# Patient Record
Sex: Male | Born: 1945
Health system: Southern US, Community
[De-identification: ages and names within clinical notes are randomized; demographics above are authoritative.]

## PROBLEM LIST (undated history)

## (undated) DIAGNOSIS — I2510 Atherosclerotic heart disease of native coronary artery without angina pectoris: Secondary | ICD-10-CM

## (undated) DIAGNOSIS — Z87442 Personal history of urinary calculi: Secondary | ICD-10-CM

## (undated) DIAGNOSIS — K4020 Bilateral inguinal hernia, without obstruction or gangrene, not specified as recurrent: Secondary | ICD-10-CM

## (undated) DIAGNOSIS — E119 Type 2 diabetes mellitus without complications: Secondary | ICD-10-CM

## (undated) DIAGNOSIS — I10 Essential (primary) hypertension: Secondary | ICD-10-CM

## (undated) DIAGNOSIS — G4733 Obstructive sleep apnea (adult) (pediatric): Secondary | ICD-10-CM

## (undated) DIAGNOSIS — M47812 Spondylosis without myelopathy or radiculopathy, cervical region: Principal | ICD-10-CM

## (undated) DIAGNOSIS — R7302 Impaired glucose tolerance (oral): Secondary | ICD-10-CM

## (undated) DIAGNOSIS — R42 Dizziness and giddiness: Secondary | ICD-10-CM

## (undated) DIAGNOSIS — Z9989 Dependence on other enabling machines and devices: Secondary | ICD-10-CM

## (undated) DIAGNOSIS — N400 Enlarged prostate without lower urinary tract symptoms: Secondary | ICD-10-CM

## (undated) DIAGNOSIS — E291 Testicular hypofunction: Secondary | ICD-10-CM

## (undated) DIAGNOSIS — I4891 Unspecified atrial fibrillation: Secondary | ICD-10-CM

## (undated) HISTORY — DX: Unspecified atrial fibrillation: I48.91

## (undated) HISTORY — DX: Essential (primary) hypertension: I10

## (undated) HISTORY — DX: Obstructive sleep apnea (adult) (pediatric): G47.33

## (undated) HISTORY — DX: Benign prostatic hyperplasia without lower urinary tract symptoms: N40.0

## (undated) HISTORY — DX: Impaired glucose tolerance (oral): R73.02

## (undated) HISTORY — DX: Spondylosis without myelopathy or radiculopathy, cervical region: M47.812

## (undated) HISTORY — DX: Dependence on other enabling machines and devices: Z99.89

## (undated) HISTORY — DX: Testicular hypofunction: E29.1

## (undated) HISTORY — DX: Dizziness and giddiness: R42

---

## 1990-03-27 HISTORY — PX: KNEE SURGERY: SHX244

## 2002-12-12 ENCOUNTER — Ambulatory Visit (HOSPITAL_BASED_OUTPATIENT_CLINIC_OR_DEPARTMENT_OTHER): Admission: RE | Admit: 2002-12-12 | Discharge: 2002-12-12 | Payer: Self-pay | Admitting: Surgery

## 2002-12-12 ENCOUNTER — Encounter (INDEPENDENT_AMBULATORY_CARE_PROVIDER_SITE_OTHER): Payer: Self-pay | Admitting: Specialist

## 2006-01-15 ENCOUNTER — Ambulatory Visit (HOSPITAL_BASED_OUTPATIENT_CLINIC_OR_DEPARTMENT_OTHER): Admission: RE | Admit: 2006-01-15 | Discharge: 2006-01-15 | Payer: Self-pay | Admitting: Orthopedic Surgery

## 2006-01-23 ENCOUNTER — Encounter: Admission: RE | Admit: 2006-01-23 | Discharge: 2006-01-23 | Payer: Self-pay | Admitting: Neurological Surgery

## 2006-09-12 ENCOUNTER — Encounter: Admission: RE | Admit: 2006-09-12 | Discharge: 2006-09-12 | Payer: Self-pay | Admitting: Neurological Surgery

## 2006-11-28 ENCOUNTER — Ambulatory Visit: Payer: Self-pay | Admitting: Psychology

## 2008-04-24 ENCOUNTER — Encounter: Admission: RE | Admit: 2008-04-24 | Discharge: 2008-04-24 | Payer: Self-pay | Admitting: Internal Medicine

## 2008-05-15 ENCOUNTER — Encounter: Admission: RE | Admit: 2008-05-15 | Discharge: 2008-05-15 | Payer: Self-pay | Admitting: Internal Medicine

## 2010-08-12 NOTE — Op Note (Signed)
NAME:  Arthur Wilson, Arthur Wilson               ACCOUNT NO.:  [id number]   MEDICAL RECORD NO.:  [id number]          PATIENT TYPE:  AMB   LOCATION:  DSC                          FACILITY:  [facility]   PHYSICIAN:  Robert A. Thurston Hole, M.D. DATE OF BIRTH:  08/25/1945   DATE OF PROCEDURE:  01/15/2006  DATE OF DISCHARGE:                                 OPERATIVE REPORT   PREOPERATIVE DIAGNOSES:  1. Left knee medial and lateral meniscal tears with chondromalacia and      synovitis.  2. Left knee anterior cruciate ligament deficiency.   POSTOPERATIVE DIAGNOSES:  1. Left knee medial and lateral meniscal tears with chondromalacia and      synovitis.  2. Left knee anterior cruciate ligament deficiency.   PROCEDURE:  1. Left knee exam under anesthesia followed by arthroscopic partial medial      and lateral meniscectomies.  2. Left knee chondroplasty with partial synovectomy.   SURGEON:  Elana Alm. Thurston Hole, M.D.   ASSISTANT:  Julien Girt, P.A.   ANESTHESIA:  Local and MAC.   OPERATIVE TIME:  Of 30 minutes.   COMPLICATIONS:  None.   INDICATIONS FOR PROCEDURE:  Arthur Wilson is a 65 year old gentleman who has had  a year of increasing left knee pain with exam and MRI documenting partial  meniscal tearing with chondromalacia and synovitis.  He has failed  conservative care and is now to undergo arthroscopy.   DESCRIPTION:  Arthur Wilson was brought to operating room on January 15, 2006  after a knee block was placed in holding room by anesthesia.  He was placed  on the operative table in supine position.  His left knee was examined.  He  had full range of motion.  He had 1 to 2+ Lachman.  He was stable to varus,  valgus and posterior stress with normal patellar tracking.  Left leg was  prepped using sterile DuraPrep and draped using sterile technique.  Originally through an anterior and lateral portal, the arthroscope with a  pump attached was placed into an anteromedial portal, and arthroscopic probe  was placed.  On initial inspection of the medial compartment, he was found  to 25 to 30% grade 3 chondromalacia which was debrided.  Medial meniscus  showed a small tear posterior medial corner of which 20% was resected back  to stable rim.  Intercondylar notch was inspected.  The anterior cruciate  ligament appeared to be chronically torn proximally and scored to the PCL  with approximately 5 mm of anterior laxity.  Posterior cruciate was intact  and stable.  Lateral compartment inspected, 25 to 30% grade 3 chondromalacia  which was debrided.  Lateral meniscus had had a previous partial  meniscectomy and about bed 80% remained and another 25 to 30% was torn, and  this was resected back to a stable rim.  Patellofemoral joint showed a 50 to  70% grade 3 chondromalacia on the patellar and femoral groove, and this was  debrided.  The patella tracked normally.  Significant synovitis in the  medial and lateral gutters were debrided.  Otherwise, they are free of  pathology.  After this was done, it felt that all of the pathology had been  satisfactorily addressed.  The instruments were removed.  Portals were  closed with 3-0 nylon suture and injected with 0.25% Marcaine with  epinephrine and 4 mg of morphine.  Sterile dressings were applied, and the  patient awakened and taken to recovery room in stable condition.   FOLLOW-UP CARE:  Arthur Wilson will be followed as an outpatient on Vicodin and  Naprosyn.  See him back in the office in a week for sutures out and follow-  up.      Robert A. Thurston Hole, M.D.  Electronically Signed     RAW/MEDQ  D:  01/15/2006  T:  01/16/2006  Job:  [id number]

## 2011-06-19 DIAGNOSIS — R972 Elevated prostate specific antigen [PSA]: Secondary | ICD-10-CM | POA: Insufficient documentation

## 2011-06-19 DIAGNOSIS — E291 Testicular hypofunction: Secondary | ICD-10-CM | POA: Insufficient documentation

## 2011-12-25 DIAGNOSIS — Z8042 Family history of malignant neoplasm of prostate: Secondary | ICD-10-CM | POA: Insufficient documentation

## 2012-11-21 ENCOUNTER — Encounter: Payer: Self-pay | Admitting: Neurology

## 2012-11-21 ENCOUNTER — Encounter (INDEPENDENT_AMBULATORY_CARE_PROVIDER_SITE_OTHER): Payer: BC Managed Care – PPO | Admitting: Radiology

## 2012-11-21 ENCOUNTER — Telehealth: Payer: Self-pay | Admitting: Neurology

## 2012-11-21 ENCOUNTER — Ambulatory Visit (INDEPENDENT_AMBULATORY_CARE_PROVIDER_SITE_OTHER): Payer: BC Managed Care – PPO | Admitting: Neurology

## 2012-11-21 VITALS — BP 126/73 | HR 48 | Ht 71.0 in | Wt 236.0 lb

## 2012-11-21 DIAGNOSIS — R42 Dizziness and giddiness: Secondary | ICD-10-CM

## 2012-11-21 DIAGNOSIS — M47812 Spondylosis without myelopathy or radiculopathy, cervical region: Secondary | ICD-10-CM

## 2012-11-21 DIAGNOSIS — Z0289 Encounter for other administrative examinations: Secondary | ICD-10-CM

## 2012-11-21 HISTORY — DX: Spondylosis without myelopathy or radiculopathy, cervical region: M47.812

## 2012-11-21 NOTE — Telephone Encounter (Signed)
Called patient. The nerve conduction studies did not show evidence of a peripheral neuropathy, but the H reflex latencies and the F wave latencies were prolonged bilaterally. The patient could potentially have spinal stenosis of the low back causing issues. Depending upon the results of the blood work and MRI of the cervical spine, we may consider looking at the lumbosacral spine in the future.

## 2012-11-21 NOTE — Progress Notes (Signed)
Reason for visit: Neck discomfort  Arthur Wilson is a 67 y.o. male  History of present illness:  Arthur Wilson is a 67 year old right-handed white male with a history of cervical spine discomfort for 12 years or more. The patient has received epidural steroid injections, and he receives chiropractic manipulations on a regular basis that helps him for 3 or 4 weeks. The patient indicates that 3 months ago, he began having floaty sensations when he is up on his feet. The patient has a spongy sensation in his feet when he walks. The patient denies actual numbness in the feet. The patient has a sensation as if he is rocking back-and-forth. The patient correlates the onset of his symptoms when he began taking Flomax. The patient felt poorly on Flomax, and within the last 5 days, he has stopped the medication, and he is starting to feel better. The patient denies any stinging sensations in his feet. The patient does have some low back pain. The patient denies pain radiating from the neck down the arms or pain radiating from the low back down the legs. The patient denies any weakness of the extremities, or problems controlling the bowels or the bladder. The patient is sent to this office for further evaluation.  Past Medical History  Diagnosis Date  . Hypertension   . Cervical spondylosis without myelopathy 11/21/2012  . Dizziness and giddiness 11/21/2012  . BPH (benign prostatic hyperplasia)     Past Surgical History  Procedure Laterality Date  . Knee surgery  1992    removal of ACL from ski fall    Family History  Problem Relation Age of Onset  . Hypertension Father   . Prostate cancer Paternal Uncle   . Hypertension Paternal Grandfather     Social history:  reports that he has never smoked. He has never used smokeless tobacco. He reports that he does not drink alcohol or use illicit drugs.  Medications:  No current outpatient prescriptions on file prior to visit.   No current  facility-administered medications on file prior to visit.     No Known Allergies  ROS:  Out of a complete 14 system review of symptoms, the patient complains only of the following symptoms, and all other reviewed systems are negative.  Urination problems Dizziness  Blood pressure 126/73, pulse 48, height 5\' 11"  (1.803 m), weight 236 lb (107.049 kg).  Physical Exam  General: The patient is alert and cooperative at the time of the examination.  Head: Pupils are equal, round, and reactive to light. Discs are flat bilaterally.  Neck: The neck is supple, no carotid bruits are noted.  Respiratory: The respiratory examination is clear.  Cardiovascular: The cardiovascular examination reveals a regular rate and rhythm, no obvious murmurs or rubs are noted.  Skin: Extremities are with 1-2+ edema at the ankles bilaterally.  Neurologic Exam  Mental status:  Cranial nerves: Facial symmetry is present. The patient has bilateral ptosis, more prominent on the right than the left. There is good sensation of the face to pinprick and soft touch bilaterally. The strength of the facial muscles and the muscles to head turning and shoulder shrug are normal bilaterally. Speech is well enunciated, no aphasia or dysarthria is noted. Extraocular movements are full. Visual fields are full.  Motor: The motor testing reveals 5 over 5 strength of all 4 extremities. Good symmetric motor tone is noted throughout.  Sensory: Sensory testing is intact to pinprick, soft touch, vibration sensation, and position sense on all  4 extremities, with the exception that there is a stocking pattern pinprick sensory deficit across the ankles bilaterally, and position sense is decreased in the right foot. No evidence of extinction is noted.  Coordination: Cerebellar testing reveals good finger-nose-finger and heel-to-shin bilaterally.  Gait and station: Gait is normal. Tandem gait is slightly unsteady. Romberg is negative.  No drift is seen.  Reflexes: Deep tendon reflexes are symmetric and normal bilaterally, with the exception that the ankle jerk reflexes are depressed bilaterally. Toes are downgoing bilaterally.   Assessment/Plan:  1. Chronic cervical spondylosis  2. Lightheaded sensations  The etiology of the lightheaded and floaty sensations, rocking sensations are not clear. The patient reports a spongy sensations in the feet, and he may have an early peripheral neuropathy. The patient will be sent for blood work today, and he will have nerve conduction studies of both legs. The patient will be set up for MRI evaluation of the cervical spine to exclude low-grade spinal cord compression, or spinal stenosis. The patient will followup in about 4 months. If a peripheral neuropathy is present, further evaluation will be undertaken. The patient apparently began having his current symptoms when he began Flomax, and he recently stopped his medication.  Marlan Palau MD 11/21/2012 9:02 PM  [facility] [address] [place], [place] [address]  Phone [phone] Fax [phone]

## 2012-11-21 NOTE — Procedures (Signed)
  HISTORY:  Aulden Calise is a 67 year old gentleman with a three-month history of a sensation of "spongy feet", and a rocking sensation when he is up on his feet. The patient is being evaluated for a possible peripheral neuropathy.  NERVE CONDUCTION STUDIES:  Nerve conduction studies were performed on both lower extremities. The distal motor latencies and motor amplitudes for the peroneal and posterior tibial nerves were within normal limits. The nerve conduction velocities for these nerves were also normal. The H reflex latencies were prolonged. The F wave latencies for the peroneal and posterior tibial nerves were prolonged bilaterally, with the exception that the right posterior tibial F-wave latency was normal. The sensory latencies for the peroneal nerves were within normal limits.   EMG STUDIES:  EMG evaluation was not performed.  IMPRESSION:  Nerve conduction studies done on both lower extremities were relatively unremarkable, without clear evidence of a peripheral neuropathy. Prolongation of the F wave latencies and H reflex latencies were seen, suggesting possible proximal dysfunction of the nerves or nerve roots. Clinical correlation is required.  Marlan Palau MD 11/21/2012 12:56 PM  [facility] [address] [place], [place] [address]  Phone [phone] Fax [phone]

## 2012-11-22 LAB — TSH: TSH: 1.29 u[IU]/mL (ref 0.450–4.500)

## 2012-11-22 LAB — VITAMIN B12: Vitamin B-12: 1815 pg/mL — ABNORMAL HIGH (ref 211–946)

## 2012-11-22 NOTE — Progress Notes (Signed)
Quick Note:  Left a message on the patient's vm relaying the results of their recent labs. Contact information was also given so that the patient may call back if they have any questions. ______ 

## 2012-11-22 NOTE — Telephone Encounter (Signed)
I called the patient. I went over the nerve conduction study results with him again. No evidence of a peripheral neuropathy, but all of the H. reflex latencies and F wave latencies are prolonged. If the MRI of the cervical spine does not show a reason for his gait issues, we will check MRI evaluation of the low back to exclude the possibility of lumbosacral spinal stenosis. The blood work was unremarkable.

## 2012-11-27 ENCOUNTER — Ambulatory Visit (INDEPENDENT_AMBULATORY_CARE_PROVIDER_SITE_OTHER): Payer: BC Managed Care – PPO

## 2012-11-27 DIAGNOSIS — R42 Dizziness and giddiness: Secondary | ICD-10-CM

## 2012-11-27 DIAGNOSIS — M47812 Spondylosis without myelopathy or radiculopathy, cervical region: Secondary | ICD-10-CM

## 2012-11-29 ENCOUNTER — Telehealth: Payer: Self-pay | Admitting: Neurology

## 2012-11-29 DIAGNOSIS — M545 Low back pain, unspecified: Secondary | ICD-10-CM

## 2012-11-29 NOTE — Telephone Encounter (Signed)
I called the patient. The MRI of the cervical spine show multilevel spondylosis, and NF stenosis. There is no spinal stenosis or cord issues. This does not explain the unusual sensations in the feet. I will get a L/S MRI.

## 2012-12-18 ENCOUNTER — Ambulatory Visit (INDEPENDENT_AMBULATORY_CARE_PROVIDER_SITE_OTHER): Payer: BC Managed Care – PPO

## 2012-12-18 DIAGNOSIS — M545 Low back pain, unspecified: Secondary | ICD-10-CM

## 2012-12-19 ENCOUNTER — Telehealth: Payer: Self-pay | Admitting: Neurology

## 2012-12-19 NOTE — Telephone Encounter (Signed)
I called patient. The MRI study of the lumbosacral spine does not show severe spinal stenosis. The patient may have a small fiber neuropathy or an early peripheral neuropathy. We will follow patient over time.

## 2013-01-30 ENCOUNTER — Other Ambulatory Visit (HOSPITAL_COMMUNITY): Payer: Self-pay | Admitting: Internal Medicine

## 2013-01-30 DIAGNOSIS — R2689 Other abnormalities of gait and mobility: Secondary | ICD-10-CM

## 2013-02-04 ENCOUNTER — Ambulatory Visit (HOSPITAL_COMMUNITY)
Admission: RE | Admit: 2013-02-04 | Discharge: 2013-02-04 | Disposition: A | Discharge status: Home / Self Care | Payer: BC Managed Care – PPO | Source: Ambulatory Visit | Attending: Internal Medicine | Admitting: Internal Medicine

## 2013-02-04 ENCOUNTER — Other Ambulatory Visit (HOSPITAL_COMMUNITY): Payer: Self-pay | Admitting: Internal Medicine

## 2013-02-04 DIAGNOSIS — R05 Cough: Secondary | ICD-10-CM | POA: Insufficient documentation

## 2013-02-04 DIAGNOSIS — R2689 Other abnormalities of gait and mobility: Secondary | ICD-10-CM

## 2013-02-04 DIAGNOSIS — R918 Other nonspecific abnormal finding of lung field: Secondary | ICD-10-CM | POA: Insufficient documentation

## 2013-02-04 DIAGNOSIS — I6789 Other cerebrovascular disease: Secondary | ICD-10-CM | POA: Insufficient documentation

## 2013-02-04 DIAGNOSIS — R55 Syncope and collapse: Secondary | ICD-10-CM | POA: Insufficient documentation

## 2013-02-04 DIAGNOSIS — R51 Headache: Secondary | ICD-10-CM | POA: Insufficient documentation

## 2013-02-04 DIAGNOSIS — R059 Cough, unspecified: Secondary | ICD-10-CM

## 2014-01-28 ENCOUNTER — Ambulatory Visit (HOSPITAL_COMMUNITY)
Admission: RE | Admit: 2014-01-28 | Discharge: 2014-01-28 | Disposition: A | Discharge status: Home / Self Care | Payer: BC Managed Care – PPO | Source: Ambulatory Visit | Attending: Internal Medicine | Admitting: Internal Medicine

## 2014-01-28 ENCOUNTER — Other Ambulatory Visit (HOSPITAL_COMMUNITY): Payer: Self-pay | Admitting: Internal Medicine

## 2014-01-28 DIAGNOSIS — M546 Pain in thoracic spine: Secondary | ICD-10-CM

## 2014-01-28 DIAGNOSIS — M47894 Other spondylosis, thoracic region: Secondary | ICD-10-CM | POA: Insufficient documentation

## 2014-02-04 ENCOUNTER — Encounter: Payer: Self-pay | Admitting: Neurology

## 2014-02-04 ENCOUNTER — Ambulatory Visit (INDEPENDENT_AMBULATORY_CARE_PROVIDER_SITE_OTHER): Payer: BC Managed Care – PPO | Admitting: Neurology

## 2014-02-04 VITALS — BP 153/74 | HR 48 | Ht 70.0 in | Wt 242.0 lb

## 2014-02-04 DIAGNOSIS — R5383 Other fatigue: Secondary | ICD-10-CM | POA: Insufficient documentation

## 2014-02-04 DIAGNOSIS — R42 Dizziness and giddiness: Secondary | ICD-10-CM

## 2014-02-04 DIAGNOSIS — M47812 Spondylosis without myelopathy or radiculopathy, cervical region: Secondary | ICD-10-CM

## 2014-02-04 DIAGNOSIS — R5382 Chronic fatigue, unspecified: Secondary | ICD-10-CM

## 2014-02-04 MED ORDER — PREGABALIN 50 MG PO CAPS: ORAL_CAPSULE | ORAL | Status: DC

## 2014-02-04 NOTE — Patient Instructions (Signed)

## 2014-02-04 NOTE — Progress Notes (Signed)
Reason for visit: fatigue, neck pain  Arthur Wilson is an 68 y.o. male  History of present illness:  Arthur Wilson is a 68 year old right-handed white male with a history of chronic fatigue issues. The patient indicates that this came on relatively suddenly. The patient was seen through this office in August 2014 indicating that his symptoms began about 3 months prior. The patient has had a long-standing history of cervical spine pain, and neck stiffness. The patient has seen a chiropractor for this. He has had MRI evaluations of the brain, cervical spine, and lumbosacral spine. The patient has been documented to have a slightly low testosterone level, but treatment offered minimal benefit. The patient indicates that he feels poorly in general. The patient in the past has had a CPAP machine, but he indicates that he never had a sleep study, and sleep apnea was never documented. He does snore at night at times, but he denies any significant issues with excessive daytime drowsiness. He returns to this office for further evaluation.  Past Medical History  Diagnosis Date  . Hypertension   . Cervical spondylosis without myelopathy 11/21/2012  . Dizziness and giddiness 11/21/2012  . BPH (benign prostatic hyperplasia)     Past Surgical History  Procedure Laterality Date  . Knee surgery  1992    removal of ACL from ski fall    Family History  Problem Relation Age of Onset  . Hypertension Father   . Prostate cancer Paternal Uncle   . Hypertension Paternal Grandfather     Social history:  reports that he has never smoked. He has never used smokeless tobacco. He reports that he does not drink alcohol or use illicit drugs.   No Known Allergies  Medications:  Current Outpatient Prescriptions on File Prior to Visit  Medication Sig Dispense Refill  . aspirin 81 MG tablet Take 81 mg by mouth daily.    . fexofenadine (ALLEGRA) 180 MG tablet Take 180 mg by mouth daily.     No current  facility-administered medications on file prior to visit.    ROS:  Out of a complete 14 system review of symptoms, the patient complains only of the following symptoms, and all other reviewed systems are negative.  Itching Joint pain, muscle cramps, aching muscles Weakness Decreased energy, disinterest in activities  Blood pressure 153/74, pulse 48, height 5\' 10"  (1.778 m), weight 242 lb (109.77 kg).  Physical Exam  General: The patient is alert and cooperative at the time of the examination. The patient is minimally obese.  Skin: No significant peripheral edema is noted.   Neurologic Exam  Mental status: The patient is oriented x 3.  Cranial nerves: Facial symmetry is present. Speech is normal, no aphasia or dysarthria is noted. Extraocular movements are full. Visual fields are full.  Motor: The patient has good strength in all 4 extremities.  Sensory examination: Soft touch sensation is symmetric on the face, arms, and legs.  Coordination: The patient has good finger-nose-finger and heel-to-shin bilaterally.  Gait and station: The patient has a normal gait. Tandem gait is normal. Romberg is negative. No drift is seen.  Reflexes: Deep tendon reflexes are symmetric.   MRI brain 02/04/13:  IMPRESSION: 1. No acute intracranial abnormality.  2. Mild for age nonspecific white matter signal changes, most commonly due to chronic small vessel disease.   MRI lumbar 12/18/12:  IMPRESSION:  Abnormal MRI lumbar spine (without) demonstrating: 1. At L2-3: disc bulging and facet hypertrophy with mild biforaminal foraminal  stenosis. 2. At L3-4, L4-5: disc bulging and facet hypertrophy with mild left foraminal stenosis. 3. Scoliosis convex to the left centered at L3-4. Multi-level degenerative changes.   MRI cervical 11/27/12:  IMPRESSION:  Abnormal MRI cervical spine (without) demonstrating mild spondylosis and disc bulging at C3-4 down to C6-7. This results in borderline  mild spinal stenosis at C3-4 to C5-6 and multi-level foraminal stenosis. No cord signal abnormalities.   Assessment/Plan:  1. Chronic fatigue  2. Cervical spondylosis, chronic neck pain  The patient will be given a trial on Lyrica to see if we can help the neuromuscular symptoms. The patient has a more generalized sensation of fatigue and malaise. Certainly, a low testosterone level may result in these symptoms, but fatigue is very nonspecific. The patient will be sent for further blood work. The possibility of a sleep disturbance or depression does need be considered. The patient will follow-up in about 4 or 5 months.  Jill Alexanders MD 02/04/2014 7:23 PM  [facility] [address] [place], [place] [address]  Phone [phone] Fax [phone]

## 2014-02-11 ENCOUNTER — Encounter: Payer: Self-pay | Admitting: Neurology

## 2014-02-17 ENCOUNTER — Encounter: Payer: Self-pay | Admitting: Neurology

## 2014-05-19 ENCOUNTER — Other Ambulatory Visit: Payer: Self-pay

## 2014-05-19 MED ORDER — PREGABALIN 50 MG PO CAPS: 50.0000 mg | ORAL_CAPSULE | Freq: Two times a day (BID) | ORAL | Status: DC

## 2014-05-19 NOTE — Telephone Encounter (Signed)
Rx signed and faxed.

## 2014-06-18 ENCOUNTER — Ambulatory Visit (INDEPENDENT_AMBULATORY_CARE_PROVIDER_SITE_OTHER): Payer: BLUE CROSS/BLUE SHIELD | Admitting: Neurology

## 2014-06-18 ENCOUNTER — Encounter: Payer: Self-pay | Admitting: Neurology

## 2014-06-18 VITALS — BP 140/73 | HR 56 | Ht 70.0 in | Wt 241.6 lb

## 2014-06-18 DIAGNOSIS — R5382 Chronic fatigue, unspecified: Secondary | ICD-10-CM

## 2014-06-18 DIAGNOSIS — M47812 Spondylosis without myelopathy or radiculopathy, cervical region: Secondary | ICD-10-CM | POA: Diagnosis not present

## 2014-06-18 DIAGNOSIS — R42 Dizziness and giddiness: Secondary | ICD-10-CM | POA: Diagnosis not present

## 2014-06-18 MED ORDER — PREGABALIN 75 MG PO CAPS: 75.0000 mg | ORAL_CAPSULE | Freq: Two times a day (BID) | ORAL | Status: DC

## 2014-06-18 MED ORDER — AMANTADINE HCL 100 MG PO CAPS: 100.0000 mg | ORAL_CAPSULE | Freq: Two times a day (BID) | ORAL | Status: DC

## 2014-06-18 NOTE — Progress Notes (Signed)
Reason for visit: Chronic fatigue  Arthur Wilson is an 69 y.o. male  History of present illness:  Arthur Wilson is a 69 year old right-handed white male with a history of cervical spondylosis and chronic neck discomfort and shoulder discomfort that seems to be better in the morning, worse as the day goes on. He is taking Lyrica for this 50 g twice daily with some benefit. He is tolerating the medication well, and he wonders if he could tolerate a higher dose. He continues to have some generalized fatigue, with a sensation of weakness in the lower extremities. The patient denies any balance issues, but he feels somewhat tremulous at times in the legs when they feel fatigued. He denies any falls. The has had some low back pain issues. He has undergone MRI evaluation of the low back previously that did not show any surgically amenable issues. The patient has had blood work done previously that was unremarkable, no obvious etiology of the fatigue was noted. At times, he will note a rocking sensation as if he has just gotten off of a boat.    Past Medical History  Diagnosis Date  . Hypertension   . Cervical spondylosis without myelopathy 11/21/2012  . Dizziness and giddiness 11/21/2012  . BPH (benign prostatic hyperplasia)     Past Surgical History  Procedure Laterality Date  . Knee surgery  1992    removal of ACL from ski fall    Family History  Problem Relation Age of Onset  . Hypertension Father   . Prostate cancer Paternal Uncle   . Hypertension Paternal Grandfather     Social history:  reports that he has never smoked. He has never used smokeless tobacco. He reports that he does not drink alcohol or use illicit drugs.   No Known Allergies  Medications:  Prior to Admission medications   Medication Sig Start Date End Date Taking? Authorizing Provider  aspirin 81 MG tablet Take 81 mg by mouth daily.   Yes Historical Provider, MD  bisoprolol-hydrochlorothiazide (ZIAC) 2.5-6.25 MG  per tablet Take 1 tablet by mouth daily.   Yes Historical Provider, MD  fexofenadine (ALLEGRA) 180 MG tablet Take 180 mg by mouth daily.   Yes Historical Provider, MD  Naproxen Sodium (ALEVE) 220 MG CAPS Take 200 mg by mouth 2 (two) times daily.   Yes Historical Provider, MD  pregabalin (LYRICA) 50 MG capsule Take 1 capsule (50 mg total) by mouth 2 (two) times daily. 05/19/14  Yes Kathrynn Ducking, MD  magnesium oxide (MAG-OX) 400 MG tablet Take 400 mg by mouth daily.    Historical Provider, MD    ROS:  Out of a complete 14 system review of symptoms, the patient complains only of the following symptoms, and all other reviewed systems are negative.  Fatigue Frequency of urination Back pain, neck stiffness Weakness in the legs Decreased concentration, anxiety  Blood pressure 140/73, pulse 56, height 5\' 10"  (1.778 m), weight 241 lb 9.6 oz (109.589 kg).  Physical Exam  General: The patient is alert and cooperative at the time of the examination. The patient is moderately obese.  Neuromuscular: The patient has good flexion and extension movements of the low back.  Skin: No significant peripheral edema is noted.   Neurologic Exam  Mental status: The patient is alert and oriented x 3 at the time of the examination. The patient has apparent normal recent and remote memory, with an apparently normal attention span and concentration ability.   Cranial nerves:  Facial symmetry is present. Speech is normal, no aphasia or dysarthria is noted. Extraocular movements are full. Visual fields are full.  Motor: The patient has good strength in all 4 extremities.  Sensory examination: Soft touch sensation is symmetric on the face, arms, and legs.  Coordination: The patient has good finger-nose-finger and heel-to-shin bilaterally.  Gait and station: The patient has a normal gait. Tandem gait is minimally unsteady. Romberg is negative. No drift is seen.  Reflexes: Deep tendon reflexes are  symmetric.   Assessment/Plan:  1. Chronic neck discomfort  2. Chronic fatigue  3. Sensation of dizziness  The patient needs to have ongoing chronic symptoms of neuromuscular discomfort in the neck, fatigue and sensation of weakness in the legs, and a rocking sensation. The patient will be increased on the Lyrica taking 75 mg twice daily. He will have blood work today looking for other etiologies of fatigue. The patient will be placed on amantadine for the fatigue to see if this benefits him, taking 100 mg twice daily. He will follow-up in 6 months.  Jill Alexanders MD 06/18/2014 7:56 PM  [facility] [address] [place], [place] [address]  Phone [phone] Fax [phone]

## 2014-06-18 NOTE — Patient Instructions (Signed)
Chronic Fatigue Syndrome Chronic fatigue syndrome (CFS) is a condition in which there is lasting, extreme tiredness (fatigue) that does not improve with rest. CFS affects women up to four times more often than men. If you have CFS, fatigue and other symptoms can make it hard for you to get through your day. There is no treatment or cure. You will need to work closely with your health care provider to come up with a treatment plan that works for you. CAUSES  No one knows what causes CFS. It may be triggered by a flu-like illness or by mono. Other triggers may include:  An abnormal immune system.  Low blood pressure.  Poor diet.  Physical or emotional stress. SIGNS AND SYMPTOMS The main symptom is fatigue that lasts all day, especially after physical or mental stress. Other common symptoms include:  An extreme loss of energy with no obvious cause.  Muscle or joint soreness.  Severe weakness.  Frequent headaches.  Fever.  Sore throat.  Swollen lymph glands.  Sleep is not refreshing.  Loss of concentration or memory. Less common symptoms may include:  Chills.  Night sweats.  Tingling or numbness.  Blurred vision.  Dizziness.  Sensitivity to noise or odors.  Mood swings.  Anxiety, panic attacks, and depression. Your symptoms may come and go, or you may have them all the time. DIAGNOSIS  There are no tests that can help health care providers diagnose CFS. It may take a long time for you to get a correct diagnosis. Your health care provider may need to do a number of tests to rule out other conditions that could be causing your symptoms. You may be diagnosed with CFS if:  You have fatigue that has lasted for at least six months.  Your fatigue is not relieved by rest.  Your fatigue is not caused by another condition.  Your fatigue is severe enough to interfere with work and daily activities.  You have at least four common symptoms of CFS. TREATMENT  There is no  cure for CFS at this time. The condition affects everyone differently. You will need to work with your health care provider to find the best treatment for your symptoms. Treatment may include:  Improving sleep with a regular bedtime routine.  Avoiding caffeine, alcohol, and tobacco.  Doing light exercise and stretching during the day.  Taking medicine to help you sleep.  Taking over-the-counter medicines to relieve joint or muscle pain.  Learning and practicing relaxation techniques.  Using memory aids or doing brain teasers to improve memory and concentration.  Seeing a mental health professional to evaluate and treat depression, if necessary.  Trying massage therapy, acupuncture, and movement exercises, like yoga or tai chi. HOME CARE INSTRUCTIONS Work closely with your health care provider to follow your treatment plan at home. You may need to make major lifestyle changes. If treatment does not seem to help, get a second opinion. You may get help from many health care providers, including doctors, mental health specialists, physical therapists, and rehabilitation therapists. Having the support of friends and loved ones is also important. SEEK MEDICAL CARE IF:  Your symptoms are not responding to treatment.  You are having strong feelings of anger, guilt, anxiety, or depression. Document Released: 04/20/2004 Document Revised: 07/28/2013 Document Reviewed: 01/31/2013 ExitCare Patient Information 2015 [employer]. This information is not intended to replace advice given to you by your health care provider. Make sure you discuss any questions you have with your health care provider.  

## 2014-06-25 LAB — ACETYLCHOLINE RECEPTOR, BINDING

## 2014-06-25 LAB — EBV, CHRONIC/ACTIVE INFECTION
EBV Early Antigen Ab, IgG: <9 U/mL (ref 0.0–8.9)
EBV NA IGG: 252 U/mL — AB (ref 0.0–17.9)
EBV VCA IgG: 543 U/mL — ABNORMAL HIGH (ref 0.0–17.9)

## 2014-06-25 LAB — B. BURGDORFI ANTIBODIES

## 2014-06-25 LAB — VGCC ANTIBODY: VGCC Antibody: NEGATIVE

## 2014-06-25 LAB — CK: Total CK: 78 U/L (ref 24–204)

## 2014-06-26 ENCOUNTER — Telehealth: Payer: Self-pay

## 2014-06-26 NOTE — Telephone Encounter (Signed)
-----   Message from Kathrynn Ducking, MD sent at 06/25/2014  5:08 PM EDT ----- The blood work results are unremarkable. EBV antibody panel suggests a chronic, past exposure to this virus. Please call the patient.  ----- Message -----    From: [facility] Lab Results In Interface    Sent: 06/19/2014   7:48 AM      To: Kathrynn Ducking, MD

## 2014-06-26 NOTE — Telephone Encounter (Signed)
Relayed results to patient's wife, Manuela Schwartz. Patient will call back if he has any questions.

## 2014-10-02 DIAGNOSIS — N138 Other obstructive and reflux uropathy: Secondary | ICD-10-CM | POA: Insufficient documentation

## 2014-12-17 ENCOUNTER — Other Ambulatory Visit: Payer: Self-pay | Admitting: Neurology

## 2014-12-24 ENCOUNTER — Encounter: Payer: Self-pay | Admitting: Neurology

## 2014-12-24 ENCOUNTER — Ambulatory Visit (INDEPENDENT_AMBULATORY_CARE_PROVIDER_SITE_OTHER): Payer: BLUE CROSS/BLUE SHIELD | Admitting: Neurology

## 2014-12-24 VITALS — BP 166/79 | HR 48 | Ht 70.0 in | Wt 242.5 lb

## 2014-12-24 DIAGNOSIS — R42 Dizziness and giddiness: Secondary | ICD-10-CM | POA: Diagnosis not present

## 2014-12-24 DIAGNOSIS — M47812 Spondylosis without myelopathy or radiculopathy, cervical region: Secondary | ICD-10-CM

## 2014-12-24 DIAGNOSIS — R5382 Chronic fatigue, unspecified: Secondary | ICD-10-CM

## 2014-12-24 NOTE — Progress Notes (Signed)
Reason for visit: fatigue  Arthur Wilson is an 69 y.o. male  History of present illness:  Mr. Arthur Wilson is a 69 year old right-handed white male with a history of chronic fatigue issues. He has a history of sleep apnea, he has been on CPAP in the past, currently not on the medication. He was given a trial on amantadine, this did not benefit him. He has had some reports of dizziness, and some neck discomfort. This has been improved with the use of Lyrica taking 75 mg twice daily. The patient has reported some weight gain, however. His neck discomfort has worsened slightly over the last month or so. The patient is considering going back on CPAP in the future. He returns for an evaluation. In the morning, he may feel somewhat weak in the legs, this improved after about one hour. He does have lumbar spondylosis demonstrated by MRI evaluation.  Past Medical History  Diagnosis Date  . Hypertension   . Cervical spondylosis without myelopathy 11/21/2012  . Dizziness and giddiness 11/21/2012  . BPH (benign prostatic hyperplasia)     Past Surgical History  Procedure Laterality Date  . Knee surgery  1992    removal of ACL from ski fall    Family History  Problem Relation Age of Onset  . Hypertension Father   . Prostate cancer Paternal Uncle   . Hypertension Paternal Grandfather     Social history:  reports that he has never smoked. He has never used smokeless tobacco. He reports that he does not drink alcohol or use illicit drugs.   No Known Allergies  Medications:  Prior to Admission medications   Medication Sig Start Date End Date Taking? Authorizing Provider  aspirin 81 MG tablet Take 81 mg by mouth daily.   Yes Historical Provider, MD  bisoprolol-hydrochlorothiazide (ZIAC) 2.5-6.25 MG per tablet Take 1 tablet by mouth daily.   Yes Historical Provider, MD  fexofenadine (ALLEGRA) 180 MG tablet Take 180 mg by mouth daily.   Yes Historical Provider, MD  LYRICA 75 MG capsule TAKE 1  CAPSULE BY MOUTH TWICE DAILY. 12/17/14  Yes Kathrynn Ducking, MD  magnesium oxide (MAG-OX) 400 MG tablet Take 400 mg by mouth daily.   Yes Historical Provider, MD  Naproxen Sodium (ALEVE) 220 MG CAPS Take 200 mg by mouth 2 (two) times daily.   Yes Historical Provider, MD    ROS:  Out of a complete 14 system review of symptoms, the patient complains only of the following symptoms, and all other reviewed systems are negative.  Fatigue Neck pain, neck stiffness Weakness  Blood pressure 166/79, pulse 48, height 5\' 10"  (1.778 m), weight 242 lb 8 oz (109.997 kg).  Physical Exam  General: The patient is alert and cooperative at the time of the examination. The patient is moderately obese.  Skin: No significant peripheral edema is noted.   Neurologic Exam  Mental status: The patient is alert and oriented x 3 at the time of the examination. The patient has apparent normal recent and remote memory, with an apparently normal attention span and concentration ability.   Cranial nerves: Facial symmetry is present. Speech is normal, no aphasia or dysarthria is noted. Extraocular movements are full. Visual fields are full.  Motor: The patient has good strength in all 4 extremities.  Sensory examination: Soft touch sensation is symmetric on the face, arms, and legs.  Coordination: The patient has good finger-nose-finger and heel-to-shin bilaterally.  Gait and station: The patient has a normal  gait. Tandem gait is normal. Romberg is negative. No drift is seen.  Reflexes: Deep tendon reflexes are symmetric.   Assessment/Plan:  1. Chronic fatigue  2. Dizziness  3. Neck and shoulder discomfort  The patient is overall doing better on the Lyrica, he has had some weight gain. The patient has a history of low testosterone, and sleep apnea not on CPAP. He may try going back on the CPAP, and he will contact me if the fatigue issue does not significantly improved. We may try Provigil in the future  if this is the case. Otherwise, he will follow-up in 6 months.  Jill Alexanders MD 12/24/2014 7:05 PM  [facility] [address] [place], [place] [address]  Phone [phone] Fax [phone]

## 2014-12-24 NOTE — Patient Instructions (Signed)

## 2015-06-24 ENCOUNTER — Ambulatory Visit (INDEPENDENT_AMBULATORY_CARE_PROVIDER_SITE_OTHER): Payer: BLUE CROSS/BLUE SHIELD | Admitting: Neurology

## 2015-06-24 ENCOUNTER — Encounter: Payer: Self-pay | Admitting: Neurology

## 2015-06-24 VITALS — BP 156/78 | HR 50 | Ht 70.0 in | Wt 244.0 lb

## 2015-06-24 DIAGNOSIS — R42 Dizziness and giddiness: Secondary | ICD-10-CM | POA: Diagnosis not present

## 2015-06-24 DIAGNOSIS — R5382 Chronic fatigue, unspecified: Secondary | ICD-10-CM

## 2015-06-24 DIAGNOSIS — G4733 Obstructive sleep apnea (adult) (pediatric): Secondary | ICD-10-CM | POA: Diagnosis not present

## 2015-06-24 DIAGNOSIS — M47812 Spondylosis without myelopathy or radiculopathy, cervical region: Secondary | ICD-10-CM

## 2015-06-24 DIAGNOSIS — Z9989 Dependence on other enabling machines and devices: Secondary | ICD-10-CM | POA: Insufficient documentation

## 2015-06-24 MED ORDER — PREGABALIN 75 MG PO CAPS: 75.0000 mg | ORAL_CAPSULE | Freq: Two times a day (BID) | ORAL | Status: DC

## 2015-06-24 NOTE — Progress Notes (Signed)
Reason for visit: Fatigue  Arthur Wilson is an 70 y.o. male  History of present illness:  Arthur Wilson is a 70 year old right-handed white male with a history of sleep apnea, he recently has gone back on his CPAP machine, and he has done well with this. He is tolerating the treatment well. He indicates that the fatigue issue has significantly improved, his ability to focus while at work has improved. He is on Lyrica for his neck discomfort and dizziness which has been beneficial. Overall, he feels much better. He is not very physically active, and he has gained some weight. He returns to the office today for an evaluation.  Past Medical History  Diagnosis Date  . Hypertension   . Cervical spondylosis without myelopathy 11/21/2012  . Dizziness and giddiness 11/21/2012  . BPH (benign prostatic hyperplasia)   . OSA (obstructive sleep apnea)     Past Surgical History  Procedure Laterality Date  . Knee surgery  1992    removal of ACL from ski fall    Family History  Problem Relation Age of Onset  . Hypertension Father   . Prostate cancer Paternal Uncle   . Hypertension Paternal Grandfather     Social history:  reports that he has never smoked. He has never used smokeless tobacco. He reports that he does not drink alcohol or use illicit drugs.   No Known Allergies  Medications:  Prior to Admission medications   Medication Sig Start Date End Date Taking? Authorizing Provider  aspirin 81 MG tablet Take 81 mg by mouth daily.   Yes Historical Provider, MD  bisoprolol-hydrochlorothiazide (ZIAC) 2.5-6.25 MG per tablet Take 1 tablet by mouth daily.   Yes Historical Provider, MD  fexofenadine (ALLEGRA) 180 MG tablet Take 180 mg by mouth daily.   Yes Historical Provider, MD  LYRICA 75 MG capsule TAKE 1 CAPSULE BY MOUTH TWICE DAILY. 12/17/14  Yes Kathrynn Ducking, MD  magnesium oxide (MAG-OX) 400 MG tablet Take 400 mg by mouth daily.   Yes Historical Provider, MD  Naproxen Sodium (ALEVE)  220 MG CAPS Take 200 mg by mouth 2 (two) times daily.   Yes Historical Provider, MD    ROS:  Out of a complete 14 system review of symptoms, the patient complains only of the following symptoms, and all other reviewed systems are negative.  Heat intolerance Sleep apnea Neck stiffness  Blood pressure 156/78, pulse 50, height 5\' 10"  (1.778 m), weight 244 lb (110.678 kg).  Physical Exam  General: The patient is alert and cooperative at the time of the examination. The patient is minimally obese.  Skin: No significant peripheral edema is noted.   Neurologic Exam  Mental status: The patient is alert and oriented x 3 at the time of the examination. The patient has apparent normal recent and remote memory, with an apparently normal attention span and concentration ability.   Cranial nerves: Facial symmetry is present. Speech is normal, no aphasia or dysarthria is noted. Extraocular movements are full. Visual fields are full.  Motor: The patient has good strength in all 4 extremities.  Sensory examination: Soft touch sensation is symmetric on the face, arms, and legs.  Coordination: The patient has good finger-nose-finger and heel-to-shin bilaterally.  Gait and station: The patient has a normal gait. Tandem gait is normal. Romberg is negative. No drift is seen.  Reflexes: Deep tendon reflexes are symmetric.   Assessment/Plan:  1. Neck pain, dizziness  2. Sleep apnea, fatigue  The patient  overall is doing much better with his level fatigue. He is treating his sleep apnea which has made a difference. He will continue the Lyrica, a prescription was written for this, he will follow-up in one year, sooner if needed.  Jill Alexanders MD 06/24/2015 5:59 PM  [facility] [address] [place], [place] [address]  Phone [phone] Fax [phone]

## 2015-11-24 ENCOUNTER — Other Ambulatory Visit: Payer: Self-pay | Admitting: Neurology

## 2015-12-16 DIAGNOSIS — M2011 Hallux valgus (acquired), right foot: Secondary | ICD-10-CM | POA: Insufficient documentation

## 2016-01-10 HISTORY — PX: FOOT SURGERY: SHX648

## 2016-01-31 NOTE — Telephone Encounter (Signed)
[id number] Rx# [id number] approved on 01/28/16. For questions, call [facility] [phone].

## 2016-05-31 ENCOUNTER — Other Ambulatory Visit: Payer: Self-pay | Admitting: *Deleted

## 2016-05-31 ENCOUNTER — Telehealth: Payer: Self-pay | Admitting: *Deleted

## 2016-05-31 MED ORDER — PREGABALIN 75 MG PO CAPS: 75.0000 mg | ORAL_CAPSULE | Freq: Two times a day (BID) | ORAL | 0 refills | Status: DC

## 2016-05-31 NOTE — Telephone Encounter (Signed)
Received fax request from [facility] for refill request on rx lyrica. Printed rx, awaiting MD signature.

## 2016-05-31 NOTE — Telephone Encounter (Signed)
Faxed printed/signed rx lyrica by CW,MD to pt pharmacy. Fax: [phone]. Received confirmation.

## 2016-06-22 ENCOUNTER — Telehealth: Payer: Self-pay | Admitting: *Deleted

## 2016-06-22 NOTE — Telephone Encounter (Signed)
Called and spoke with pt. Scheduled appt for 07/06/16 at 12pm, check in 1130am. Pt verbalized understanding and appreciation.

## 2016-06-29 ENCOUNTER — Ambulatory Visit: Payer: BLUE CROSS/BLUE SHIELD | Admitting: Neurology

## 2016-07-06 ENCOUNTER — Encounter (INDEPENDENT_AMBULATORY_CARE_PROVIDER_SITE_OTHER): Payer: Self-pay

## 2016-07-06 ENCOUNTER — Ambulatory Visit (INDEPENDENT_AMBULATORY_CARE_PROVIDER_SITE_OTHER): Payer: 59 | Admitting: Neurology

## 2016-07-06 ENCOUNTER — Encounter: Payer: Self-pay | Admitting: Neurology

## 2016-07-06 VITALS — BP 113/65 | HR 44 | Ht 70.0 in | Wt 238.5 lb

## 2016-07-06 DIAGNOSIS — M47812 Spondylosis without myelopathy or radiculopathy, cervical region: Secondary | ICD-10-CM | POA: Diagnosis not present

## 2016-07-06 DIAGNOSIS — R5382 Chronic fatigue, unspecified: Secondary | ICD-10-CM | POA: Diagnosis not present

## 2016-07-06 MED ORDER — PREGABALIN 100 MG PO CAPS: 100.0000 mg | ORAL_CAPSULE | Freq: Two times a day (BID) | ORAL | 1 refills | Status: DC

## 2016-07-06 NOTE — Progress Notes (Signed)
Reason for visit: Cervical strain  Arthur Wilson is an 71 y.o. male  History of present illness:  Arthur Wilson is a 71 year old right-handed white male with a history of chronic neck and shoulder discomfort. The patient in general has done better with the Lyrica, he is tolerating the dose fairly well. The patient is on CPAP which has helped his fatigue associated with sleep apnea. The patient at times may have some slight gait instability, he denies any falls. He does have some degree of constant soreness of the neck and shoulders. He is sleeping fairly well at night.  Past Medical History:  Diagnosis Date  . BPH (benign prostatic hyperplasia)   . Cervical spondylosis without myelopathy 11/21/2012  . Dizziness and giddiness 11/21/2012  . Hypertension   . OSA (obstructive sleep apnea)     Past Surgical History:  Procedure Laterality Date  . FOOT SURGERY  01/10/2016   Bunion removal  . KNEE SURGERY  1992   removal of ACL from ski fall    Family History  Problem Relation Age of Onset  . Hypertension Father   . Prostate cancer Paternal Uncle   . Hypertension Paternal Grandfather     Social history:  reports that he has never smoked. He has never used smokeless tobacco. He reports that he does not drink alcohol or use drugs.   No Known Allergies  Medications:  Prior to Admission medications   Medication Sig Start Date End Date Taking? Authorizing Provider  aspirin 81 MG tablet Take 81 mg by mouth daily.   Yes Historical Provider, MD  bisoprolol-hydrochlorothiazide (ZIAC) 2.5-6.25 MG per tablet Take 1 tablet by mouth daily.   Yes Historical Provider, MD  fexofenadine (ALLEGRA) 180 MG tablet Take 180 mg by mouth daily.   Yes Historical Provider, MD  Naproxen Sodium (ALEVE) 220 MG CAPS Take 200 mg by mouth 2 (two) times daily.   Yes Historical Provider, MD  pregabalin (LYRICA) 75 MG capsule Take 1 capsule (75 mg total) by mouth 2 (two) times daily. 05/31/16  Yes Kathrynn Ducking,  MD  UNABLE TO FIND Med Name: CPAP   Yes Historical Provider, MD    ROS:  Out of a complete 14 system review of symptoms, the patient complains only of the following symptoms, and all other reviewed systems are negative.  Back pain, walking difficulty, neck stiffness Tremors Anxiety  Blood pressure 113/65, pulse (!) 44, height 5\' 10"  (1.778 m), weight 238 lb 8 oz (108.2 kg).  Physical Exam  General: The patient is alert and cooperative at the time of the examination.  Neuromuscular: Range of movement the cervical spine is full.  Skin: No significant peripheral edema is noted.   Neurologic Exam  Mental status: The patient is alert and oriented x 3 at the time of the examination. The patient has apparent normal recent and remote memory, with an apparently normal attention span and concentration ability.   Cranial nerves: Facial symmetry is present. Speech is normal, no aphasia or dysarthria is noted. Extraocular movements are full. Visual fields are full.  Motor: The patient has good strength in all 4 extremities.  Sensory examination: Soft touch sensation is symmetric on the face, arms, and legs.  Coordination: The patient has good finger-nose-finger and heel-to-shin bilaterally.  Gait and station: The patient has a normal gait. Tandem gait is normal. Romberg is negative. No drift is seen.  Reflexes: Deep tendon reflexes are symmetric.   Assessment/Plan:  1. Chronic cervical strain  The patient will be increased on Lyrica taking 100 mg twice daily, a prescription was given. The patient will contact me if he needs to have a dose adjustment. He will follow-up otherwise in one year.  Jill Alexanders MD 07/06/2016 12:23 PM  [facility] [address] [place], [place] [address]  Phone [phone] Fax [phone]

## 2016-09-25 IMAGING — CR DG THORACIC SPINE 3V
3 series · 3 of 3 positions shown · non-contrast
Comparison: Chest radiographs 02/04/2013

CLINICAL DATA: Bilateral thoracic back pain since [DATE].

EXAM:
THORACIC SPINE - 2 VIEW + SWIMMERS

[view not recorded (1 of 3)]
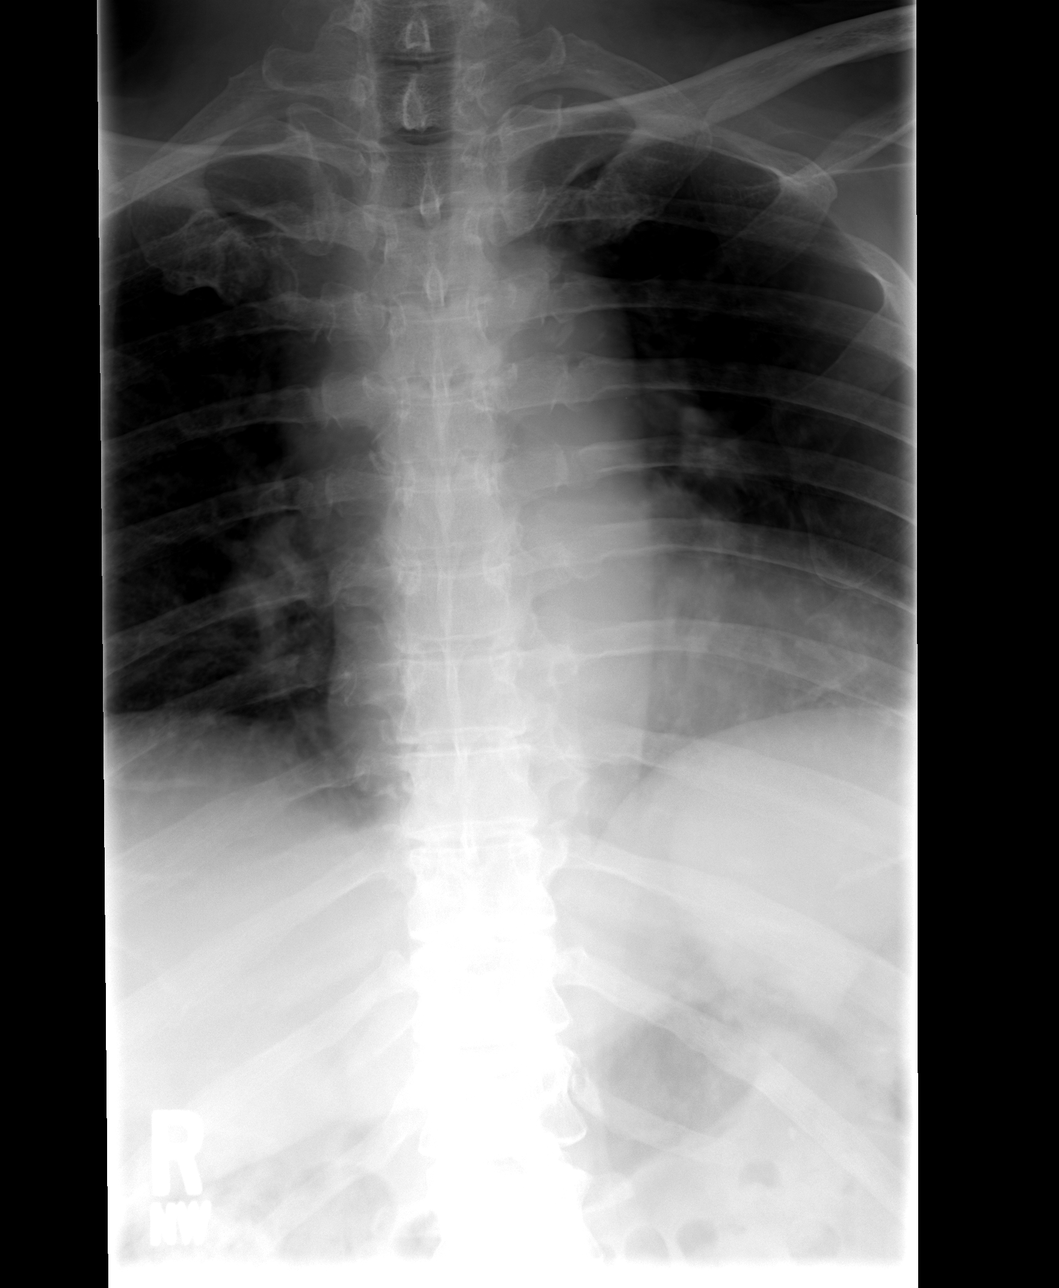

[view not recorded (2 of 3)]
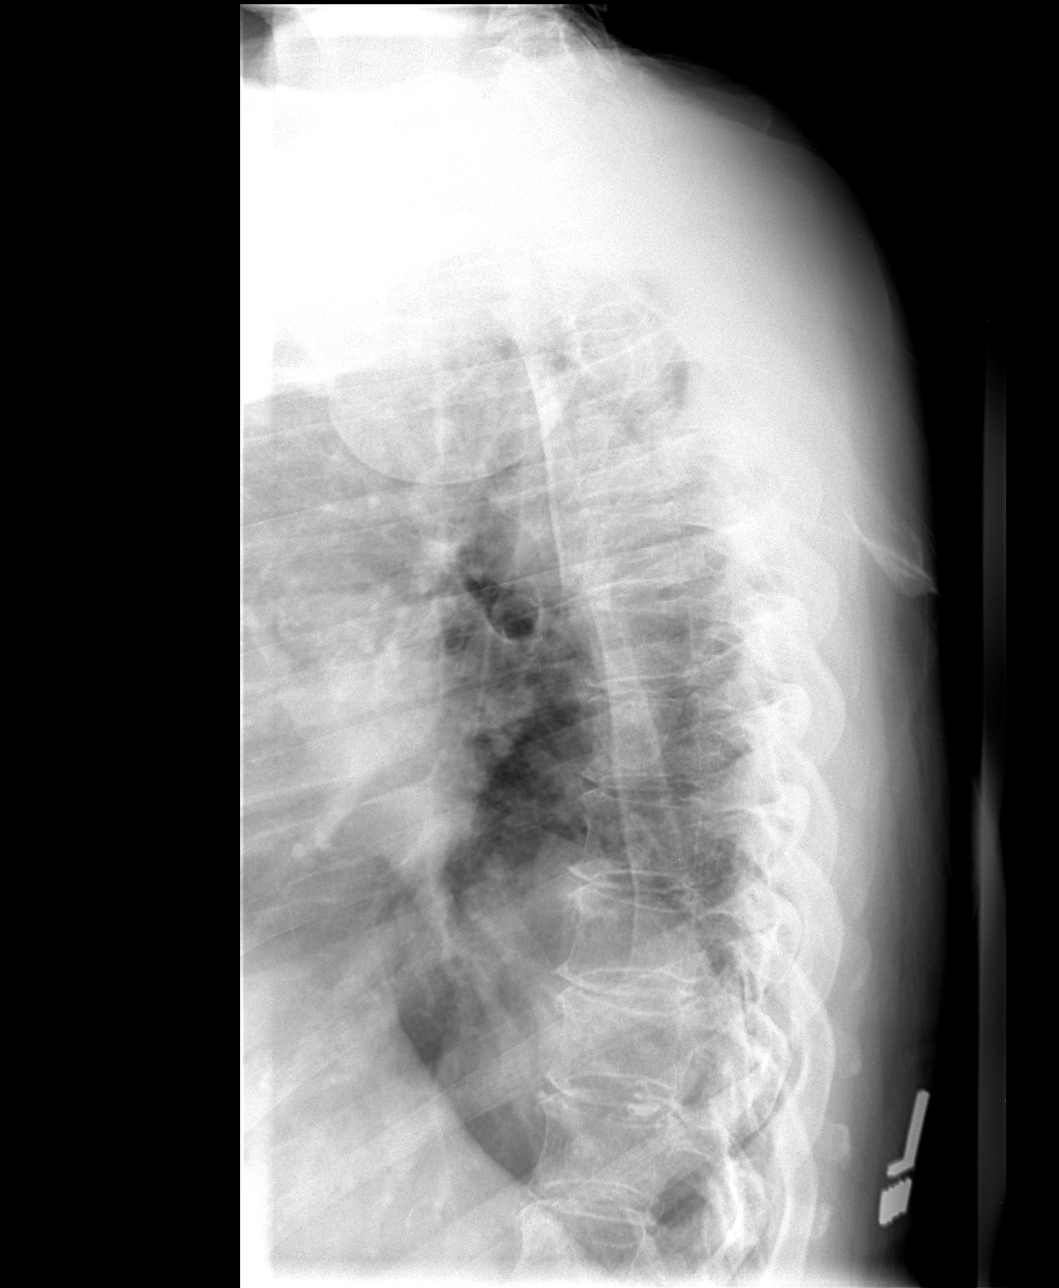

[view not recorded (3 of 3)]
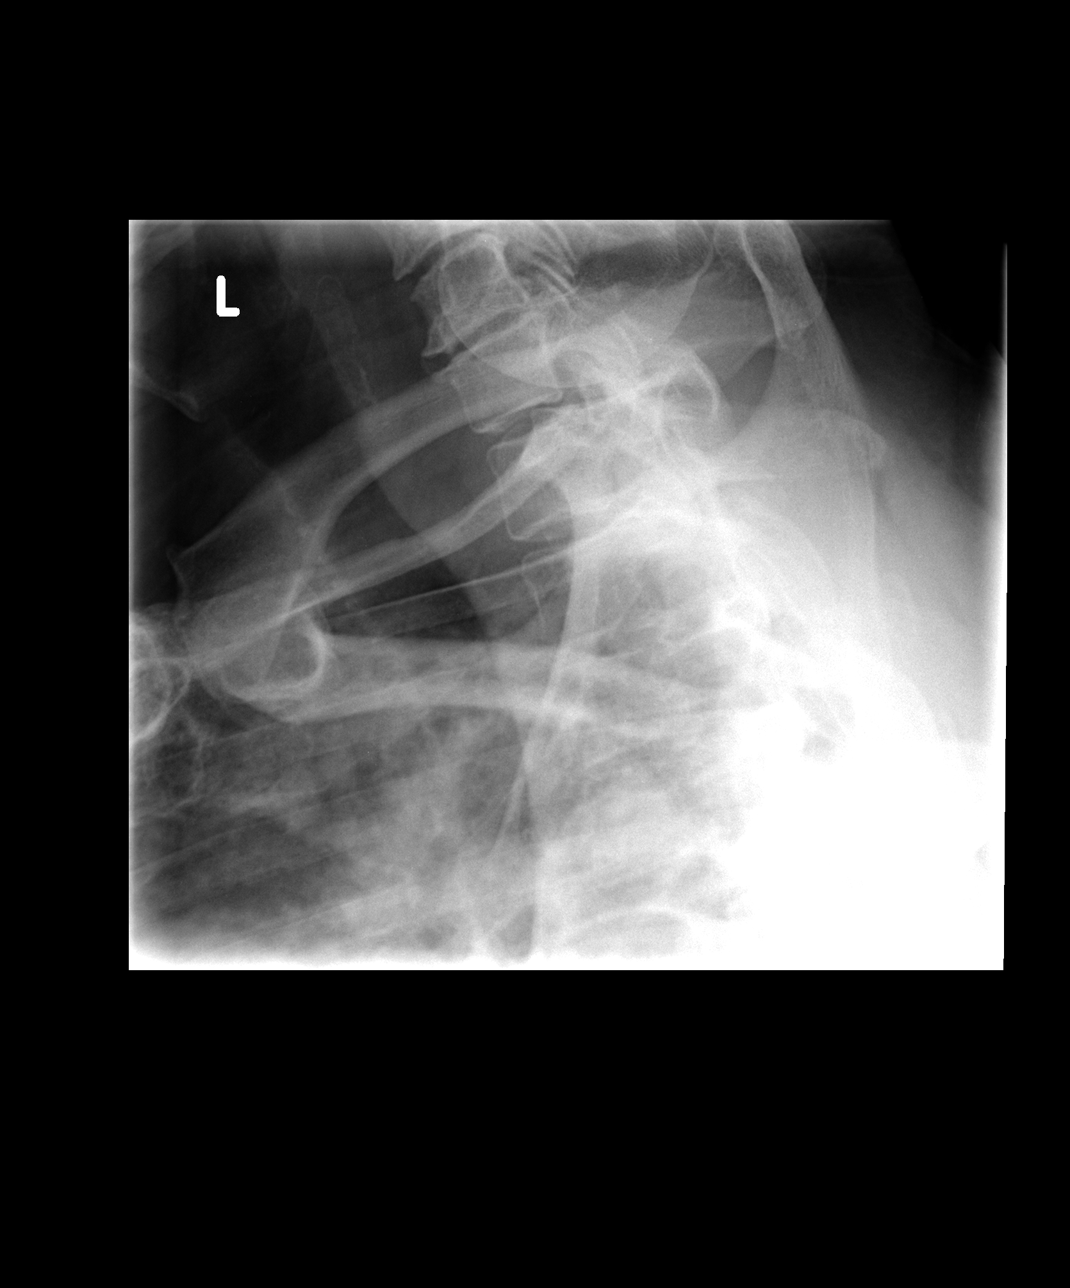

[3 of 3 positions shown; findings below may reference images not displayed]

FINDINGS: There are 12 pairs of ribs. Vertebral alignment is normal. Mild
endplate osteophytosis is present throughout the mid and lower
thoracic spine. Vertebral body heights are preserved without
evidence of compression fracture lower cervical spondylosis is
partially visualized. No lytic or blastic osseous lesion is seen.
IMPRESSION: Mild thoracic spondylosis.

## 2017-01-08 ENCOUNTER — Other Ambulatory Visit: Payer: Self-pay | Admitting: Neurology

## 2017-01-08 NOTE — Telephone Encounter (Signed)
Faxed printed/signed rx Lyrica to [facility] at [phone]. Received fax confirmation.

## 2017-02-12 ENCOUNTER — Telehealth: Payer: Self-pay | Admitting: *Deleted

## 2017-02-12 NOTE — Telephone Encounter (Signed)
Received notice of approval from [facility] for PA lyrica.  Request Reference Number: [id number]. LYRICA CAP 100MG  is approved through 02/09/2018. For further questions, call [phone]. Also received fax. Sent approval notification to [employer]. Fax: [phone]. Received fax confirmation.

## 2017-02-12 NOTE — Telephone Encounter (Signed)
Submitted PA lyrica to covermymeds. Key: [id number]. PA#: [id number]. Awaiting determination from [facility].

## 2017-06-15 ENCOUNTER — Other Ambulatory Visit: Payer: Self-pay | Admitting: Neurology

## 2017-07-10 ENCOUNTER — Encounter: Payer: Self-pay | Admitting: Neurology

## 2017-07-10 ENCOUNTER — Ambulatory Visit (INDEPENDENT_AMBULATORY_CARE_PROVIDER_SITE_OTHER): Payer: 59 | Admitting: Neurology

## 2017-07-10 VITALS — BP 126/68 | HR 43 | Ht 70.0 in | Wt 248.0 lb

## 2017-07-10 DIAGNOSIS — M47812 Spondylosis without myelopathy or radiculopathy, cervical region: Secondary | ICD-10-CM | POA: Diagnosis not present

## 2017-07-10 MED ORDER — PREGABALIN 100 MG PO CAPS: ORAL_CAPSULE | ORAL | 1 refills | Status: DC

## 2017-07-10 NOTE — Progress Notes (Signed)
Reason for visit: Cervical spondylosis  Arthur Wilson is an 72 y.o. male  History of present illness:  Arthur Wilson is a 71 year old right-handed white male with a history of cervical spondylosis and sleep apnea.  The patient has done better with his fatigue issue on CPAP.  The patient is taking Lyrica for his chronic neck pain which has been helpful.  The patient in general does fairly well, when he gets under a lot of stress, his neck tension may increase.  The patient overall is doing well however.  He continues to work, he is thinking about retiring in the near future.  He returns to this office for an evaluation.  Past Medical History:  Diagnosis Date  . BPH (benign prostatic hyperplasia)   . Cervical spondylosis without myelopathy 11/21/2012  . Dizziness and giddiness 11/21/2012  . Hypertension   . OSA (obstructive sleep apnea)     Past Surgical History:  Procedure Laterality Date  . FOOT SURGERY  01/10/2016   Bunion removal  . KNEE SURGERY  1992   removal of ACL from ski fall    Family History  Problem Relation Age of Onset  . Hypertension Father   . Prostate cancer Paternal Uncle   . Hypertension Paternal Grandfather     Social history:  reports that he has never smoked. He has never used smokeless tobacco. He reports that he does not drink alcohol or use drugs.   No Known Allergies  Medications:  Prior to Admission medications   Medication Sig Start Date End Date Taking? Authorizing Provider  aspirin 81 MG tablet Take 81 mg by mouth daily.   Yes [provider]  bisoprolol-hydrochlorothiazide (ZIAC) 2.5-6.25 MG per tablet Take 1 tablet by mouth daily.   Yes [provider]  fexofenadine (ALLEGRA) 180 MG tablet Take 180 mg by mouth daily.   Yes [provider]  Naproxen Sodium (ALEVE) 220 MG CAPS Take 200 mg by mouth 2 (two) times daily.   Yes [provider]  pregabalin (LYRICA) 100 MG capsule TAKE (1) CAPSULE BY MOUTH TWICE  DAILY. 07/10/17  Yes Kathrynn Ducking, MD  UNABLE TO FIND Med Name: CPAP   Yes [provider]    ROS:  Out of a complete 14 system review of symptoms, the patient complains only of the following symptoms, and all other reviewed systems are negative.  Sleep apnea Back pain, neck stiffness Frequency of urination Anxiety  Blood pressure 126/68, pulse (!) 43, height 5\' 10"  (1.778 m), weight 248 lb (112.5 kg).  Physical Exam  General: The patient is alert and cooperative at the time of the examination.  The patient is moderately obese.  Neuromuscular: Range of movement the cervical spine is full.  Skin: No significant peripheral edema is noted.   Neurologic Exam  Mental status: The patient is alert and oriented x 3 at the time of the examination. The patient has apparent normal recent and remote memory, with an apparently normal attention span and concentration ability.   Cranial nerves: Facial symmetry is present. Speech is normal, no aphasia or dysarthria is noted. Extraocular movements are full. Visual fields are full.  Motor: The patient has good strength in all 4 extremities.  Sensory examination: Soft touch sensation is symmetric on the face, arms, and legs.  Coordination: The patient has good finger-nose-finger and heel-to-shin bilaterally.  Gait and station: The patient has a normal gait. Tandem gait is normal. Romberg is negative. No drift is seen.  Reflexes: Deep tendon reflexes are symmetric.   Assessment/Plan:  1.  Cervical spondylosis  The patient overall is doing fairly well with the Lyrica.  He will continue medication for now, prescription was sent in and he will follow-up in 1 year.  Jill Alexanders MD 07/10/2017 2:51 PM  [facility] [address] [place], [place] [address]  Phone [phone] Fax [phone]

## 2017-12-31 ENCOUNTER — Other Ambulatory Visit: Payer: Self-pay | Admitting: Neurology

## 2017-12-31 NOTE — Telephone Encounter (Signed)
Rx registry checked. Last fill date is 11/27/17 for #[id number]. Next OV is 07/16/18.

## 2018-03-04 ENCOUNTER — Other Ambulatory Visit: Payer: Self-pay | Admitting: Neurology

## 2018-03-04 DIAGNOSIS — M25561 Pain in right knee: Secondary | ICD-10-CM | POA: Insufficient documentation

## 2018-07-15 ENCOUNTER — Telehealth: Payer: Self-pay

## 2018-07-15 NOTE — Telephone Encounter (Signed)
I was able to reach the pt in regards to his 07/16/18 appt. Pt was advised, due to current COVID 19 pandemic, our office is severely reducing in office visits for at least the next 2 weeks, in order to minimize the risk to our patients and healthcare providers.  Pt was offered a video visit on 07/16/18 at 4 pm and agreed.  Pt understands that although there may be some limitations with this type of visit, we will take all precautions to reduce any security or privacy concerns.  Pt understands that this will be treated like an in office visit and we will file with pt's insurance, and there may be a patient responsible charge related to this service.    Pt's email is [email or web address] Pt understands that the [employer] webex software must be downloaded and operational on the device pt plans to use for the visit.  Pre charting for 07/16/18 visit has been completed.

## 2018-07-16 ENCOUNTER — Ambulatory Visit (INDEPENDENT_AMBULATORY_CARE_PROVIDER_SITE_OTHER): Payer: Medicare Other | Admitting: Neurology

## 2018-07-16 ENCOUNTER — Encounter: Payer: Self-pay | Admitting: Neurology

## 2018-07-16 ENCOUNTER — Telehealth: Payer: Self-pay

## 2018-07-16 ENCOUNTER — Other Ambulatory Visit: Payer: Self-pay

## 2018-07-16 DIAGNOSIS — M47812 Spondylosis without myelopathy or radiculopathy, cervical region: Secondary | ICD-10-CM | POA: Diagnosis not present

## 2018-07-16 NOTE — Progress Notes (Signed)
    Virtual Visit via Video Note  I connected with Arthur Wilson on 07/16/18 at  4:00 PM EDT by a video enabled telemedicine application and verified that I am speaking with the correct person using two identifiers.   I discussed the limitations of evaluation and management by telemedicine and the availability of in person appointments. The patient expressed understanding and agreed to proceed.  The patient is at home, physician in office.  History of Present Illness: Arthur Wilson is a 73 year old right-handed white male with a history of cervical spondylosis.  The patient has done quite well on Lyrica taking 100 mg twice daily, he believes that this has offered good benefit with his chronic neck pain.  He has undergone right knee surgery since last seen, he has recovered from this.  He does exercise on a regular basis, he uses a stationary bike primarily.  He is sleeping well at night, he does have sleep apnea on CPAP.  He does not use CPAP he has significant fatigue during the day.  The patient overall believes that he is doing fairly well currently with his medical issues.   Observations/Objective: The WebEx interview was impaired by the fact that the device that the patient was using did not have a video camera.  The patient appeared to be alert and cooperative, he is answering questions appropriate, speech is well enunciated, not aphasic or dysarthric.  Assessment and Plan: 1.  Cervical spondylosis  The patient will continue the Lyrica, he will call when he needs a refill.  He will otherwise follow-up in 1 year, sooner if needed.  Follow Up Instructions: 1 year follow-up, may see nurse practitioner.   I discussed the assessment and treatment plan with the patient. The patient was provided an opportunity to ask questions and all were answered. The patient agreed with the plan and demonstrated an understanding of the instructions.   The patient was advised to call back or seek an  in-person evaluation if the symptoms worsen or if the condition fails to improve as anticipated.  I provided 20 minutes of non-face-to-face time during this encounter.   Kathrynn Ducking, MD

## 2018-07-16 NOTE — Telephone Encounter (Signed)
I spoke with pt about scheduling his yearly f/u. I advised Dr. Jannifer Franklin is scheduled to retire in Oct of 2020 and he stated the pt could f/u with Np in 1 year. Pt requested to have one more f/u scheduled with MD before retirement and has been scheduled for 12/20/18 at 12 pm.

## 2018-08-20 ENCOUNTER — Other Ambulatory Visit: Payer: Self-pay | Admitting: Neurology

## 2018-11-25 ENCOUNTER — Other Ambulatory Visit: Payer: Self-pay

## 2018-11-25 DIAGNOSIS — Z20822 Contact with and (suspected) exposure to covid-19: Secondary | ICD-10-CM

## 2018-11-27 LAB — NOVEL CORONAVIRUS, NAA: SARS-CoV-2, NAA: NOT DETECTED

## 2018-12-20 ENCOUNTER — Ambulatory Visit: Payer: Self-pay | Admitting: Neurology

## 2019-01-01 ENCOUNTER — Ambulatory Visit: Payer: Self-pay | Admitting: Neurology

## 2019-02-05 ENCOUNTER — Ambulatory Visit: Payer: Self-pay | Admitting: Neurology

## 2019-02-25 ENCOUNTER — Telehealth: Payer: Self-pay | Admitting: Unknown Physician Specialty

## 2019-02-25 ENCOUNTER — Other Ambulatory Visit: Payer: Self-pay

## 2019-02-25 ENCOUNTER — Other Ambulatory Visit: Payer: Self-pay | Admitting: Unknown Physician Specialty

## 2019-02-25 DIAGNOSIS — U071 COVID-19: Secondary | ICD-10-CM

## 2019-02-25 DIAGNOSIS — Z20822 Contact with and (suspected) exposure to covid-19: Secondary | ICD-10-CM

## 2019-02-25 NOTE — Telephone Encounter (Signed)
Scheduled for 12/3 0830

## 2019-02-25 NOTE — Telephone Encounter (Signed)
Discussed with patient about Covid symptoms and the use of bamlanivimab, a monoclonal antibody infusion for those with mild to moderate Covid symptoms and at a high risk of hospitalization.    Pt is qualified for this infusion at the [facility] infusion center due to co-morbid conditions and/or a  member of an at-risk group.  Receiving care for hypertension through primary care.    Pt states he is fatigued for 5 days and is interested in the infusion.  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

## 2019-02-25 NOTE — Progress Notes (Signed)
For Ban infusion.  Addressed problem list and all being addressed by PCP

## 2019-02-26 ENCOUNTER — Ambulatory Visit (HOSPITAL_COMMUNITY): Payer: Medicare Other

## 2019-02-27 ENCOUNTER — Ambulatory Visit (HOSPITAL_COMMUNITY)
Admission: RE | Admit: 2019-02-27 | Discharge: 2019-02-27 | Disposition: A | Discharge status: Home / Self Care | Payer: Medicare Other | Source: Ambulatory Visit | Attending: Critical Care Medicine | Admitting: Critical Care Medicine

## 2019-02-27 ENCOUNTER — Encounter (HOSPITAL_COMMUNITY): Payer: Self-pay

## 2019-02-27 DIAGNOSIS — U071 COVID-19: Secondary | ICD-10-CM | POA: Insufficient documentation

## 2019-02-27 DIAGNOSIS — Z23 Encounter for immunization: Secondary | ICD-10-CM | POA: Insufficient documentation

## 2019-02-27 LAB — NOVEL CORONAVIRUS, NAA: SARS-CoV-2, NAA: DETECTED — AB

## 2019-02-27 MED ORDER — EPINEPHRINE 0.3 MG/0.3ML IJ SOAJ: 0.3000 mg | Freq: Once | INTRAMUSCULAR | Status: DC | PRN

## 2019-02-27 MED ORDER — ALBUTEROL SULFATE HFA 108 (90 BASE) MCG/ACT IN AERS: 2.0000 | INHALATION_SPRAY | Freq: Once | RESPIRATORY_TRACT | Status: DC | PRN

## 2019-02-27 MED ORDER — FAMOTIDINE IN NACL 20-0.9 MG/50ML-% IV SOLN: 20.0000 mg | Freq: Once | INTRAVENOUS | Status: DC | PRN

## 2019-02-27 MED ORDER — METHYLPREDNISOLONE SODIUM SUCC 125 MG IJ SOLR: 125.0000 mg | Freq: Once | INTRAMUSCULAR | Status: DC | PRN

## 2019-02-27 MED ORDER — SODIUM CHLORIDE 0.9 % IV SOLN
INTRAVENOUS | Status: DC | PRN
  Administered 2019-02-27: 1000 mL via INTRAVENOUS

## 2019-02-27 MED ORDER — SODIUM CHLORIDE 0.9 % IV SOLN
700.0000 mg | Freq: Once | INTRAVENOUS | Status: AC
  Administered 2019-02-27: 700 mg via INTRAVENOUS
  Filled 2019-02-27: qty 20

## 2019-02-27 MED ORDER — DIPHENHYDRAMINE HCL 50 MG/ML IJ SOLN: 50.0000 mg | Freq: Once | INTRAMUSCULAR | Status: DC | PRN

## 2019-02-27 NOTE — Progress Notes (Addendum)
  Diagnosis: COVID-19  Physician: Leslie Andrea, MD  Procedure: bamlanivimab infusion Provided patient with bamlanivimab fact sheet for patients, parents and caregivers prior to infusion.  Complications: No immediate complications noted.  Discharge: Discharged home   Gaye Alken 02/27/2019  Discharged at 1112AM

## 2019-04-10 ENCOUNTER — Other Ambulatory Visit: Payer: Self-pay | Admitting: Neurology

## 2019-05-01 ENCOUNTER — Encounter: Payer: Self-pay | Admitting: Neurology

## 2019-05-01 ENCOUNTER — Ambulatory Visit: Payer: Medicare Other | Admitting: Neurology

## 2019-05-01 ENCOUNTER — Other Ambulatory Visit: Payer: Self-pay

## 2019-05-01 VITALS — BP 136/71 | HR 78 | Temp 97.1°F | Ht 70.0 in | Wt 248.5 lb

## 2019-05-01 DIAGNOSIS — M47812 Spondylosis without myelopathy or radiculopathy, cervical region: Secondary | ICD-10-CM | POA: Diagnosis not present

## 2019-05-01 NOTE — Progress Notes (Signed)
Reason for visit: Cervical spondylosis  Arthur Wilson is an 74 y.o. male  History of present illness:  Arthur Wilson is a 74 year old right-handed white male with a history of cervical spondylosis and some neck discomfort.  He generally does fairly well with the Lyrica, but occasionally he may have a flareup of the neck pain.  He may take half of a hydrocodone tablet if needed, he takes alprazolam 0.5 mg, he takes 1/2 tablet twice daily.  He is sleeping well at night, he is considering retiring in the near future.  The patient is on CPAP at night, he generally sleeps about 6 hours a day.  He returns to the office today for an evaluation.  Past Medical History:  Diagnosis Date  . BPH (benign prostatic hyperplasia)   . Cervical spondylosis without myelopathy 11/21/2012  . Dizziness and giddiness 11/21/2012  . Hypertension   . OSA (obstructive sleep apnea)     Past Surgical History:  Procedure Laterality Date  . FOOT SURGERY  01/10/2016   Bunion removal  . KNEE SURGERY  1992   removal of ACL from ski fall    Family History  Problem Relation Age of Onset  . Hypertension Father   . Prostate cancer Paternal Uncle   . Hypertension Paternal Grandfather     Social history:  reports that he has never smoked. He has never used smokeless tobacco. He reports that he does not drink alcohol or use drugs.   No Known Allergies  Medications:  Prior to Admission medications   Medication Sig Start Date End Date Taking? Authorizing Provider  ALPRAZolam Duanne Moron) 0.5 MG tablet Take 0.5 tablets by mouth. 07/05/18  Yes [provider]  bisoprolol-hydrochlorothiazide (ZIAC) 2.5-6.25 MG per tablet Take 1 tablet by mouth daily.   Yes [provider]  bisoprolol-hydrochlorothiazide (ZIAC) 5-6.25 MG tablet Take 1 tablet by mouth daily. 02/17/19  Yes [provider]  fexofenadine (ALLEGRA) 180 MG tablet Take 180 mg by mouth daily.   Yes [provider]    HYDROcodone-acetaminophen ([place]) 7.5-325 MG tablet  05/09/18  Yes [provider]  Naproxen Sodium (ALEVE) 220 MG CAPS Take 200 mg by mouth 2 (two) times daily.   Yes [provider]  pregabalin (LYRICA) 100 MG capsule TAKE (1) CAPSULE BY MOUTH TWICE DAILY. 04/10/19  Yes Kathrynn Ducking, MD  UNABLE TO FIND Med Name: CPAP   Yes [provider]    ROS:  Out of a complete 14 system review of symptoms, the patient complains only of the following symptoms, and all other reviewed systems are negative.  Neck pain  Blood pressure 136/71, pulse 78, temperature (!) 97.1 F (36.2 C), height 5\' 10"  (1.778 m), weight 248 lb 8 oz (112.7 kg).  Physical Exam  General: The patient is alert and cooperative at the time of the examination.  The patient is mildly obese.  Neuromuscular: Range move the cervical spine lacks only about 10 degrees of full lateral rotation bilaterally.  Skin: 1-2+ edema below the knees is seen bilaterally.   Neurologic Exam  Mental status: The patient is alert and oriented x 3 at the time of the examination. The patient has apparent normal recent and remote memory, with an apparently normal attention span and concentration ability.   Cranial nerves: Facial symmetry is present. Speech is normal, no aphasia or dysarthria is noted. Extraocular movements are full. Visual fields are full.  Motor: The patient has good strength in all 4 extremities.  Sensory examination: Soft touch sensation is symmetric on the face, arms, and legs.  Coordination: The patient has good finger-nose-finger and heel-to-shin bilaterally.  Gait and station: The patient has a normal gait. Tandem gait is normal. Romberg is negative. No drift is seen.  Reflexes: Deep tendon reflexes are symmetric.   Assessment/Plan:  1.  Cervical spondylosis  The patient is doing quite well with the Lyrica, he believes that this has been very helpful in controlling his pain.  He has  been given a recent prescription for the medication, he will follow-up here in 1 year.  Greater than 50% of the visit was spent in counseling and coordination of care.  Face-to-face time with the patient was 20 minutes.    Jill Alexanders MD 05/01/2019 9:17 AM  [facility] [address] [place], [place] [address]  Phone [phone] Fax [phone]

## 2019-10-13 ENCOUNTER — Other Ambulatory Visit: Payer: Self-pay | Admitting: Neurology

## 2019-11-03 ENCOUNTER — Other Ambulatory Visit (HOSPITAL_COMMUNITY): Payer: Self-pay | Admitting: Internal Medicine

## 2019-11-03 DIAGNOSIS — I4891 Unspecified atrial fibrillation: Secondary | ICD-10-CM

## 2019-11-05 ENCOUNTER — Ambulatory Visit (HOSPITAL_COMMUNITY)
Admission: RE | Admit: 2019-11-05 | Discharge: 2019-11-05 | Disposition: A | Discharge status: Home / Self Care | Payer: Medicare Other | Source: Ambulatory Visit | Attending: Internal Medicine | Admitting: Internal Medicine

## 2019-11-05 ENCOUNTER — Other Ambulatory Visit: Payer: Self-pay

## 2019-11-05 DIAGNOSIS — I4891 Unspecified atrial fibrillation: Secondary | ICD-10-CM | POA: Insufficient documentation

## 2019-11-05 LAB — ECHOCARDIOGRAM COMPLETE
AR max vel: 2.56 cm2
AV Area VTI: 2.79 cm2
AV Area mean vel: 2.76 cm2
AV Mean grad: 2.7 mmHg
AV Peak grad: 5.6 mmHg
Ao pk vel: 1.19 m/s
Area-P 1/2: 3.53 cm2
S' Lateral: 2.97 cm

## 2019-11-05 NOTE — Progress Notes (Signed)
*  PRELIMINARY RESULTS* Echocardiogram 2D Echocardiogram has been performed.  Arthur Wilson 11/05/2019, 10:22 AM

## 2020-01-14 ENCOUNTER — Other Ambulatory Visit (HOSPITAL_COMMUNITY): Payer: Self-pay | Admitting: Family Medicine

## 2020-01-14 ENCOUNTER — Ambulatory Visit (HOSPITAL_COMMUNITY)
Admission: RE | Admit: 2020-01-14 | Discharge: 2020-01-14 | Disposition: A | Discharge status: Home / Self Care | Payer: Medicare Other | Source: Ambulatory Visit | Attending: Family Medicine | Admitting: Family Medicine

## 2020-01-14 ENCOUNTER — Other Ambulatory Visit: Payer: Self-pay

## 2020-01-14 DIAGNOSIS — I4891 Unspecified atrial fibrillation: Secondary | ICD-10-CM

## 2020-01-15 ENCOUNTER — Ambulatory Visit (INDEPENDENT_AMBULATORY_CARE_PROVIDER_SITE_OTHER): Payer: Medicare Other | Admitting: Cardiology

## 2020-01-15 ENCOUNTER — Encounter: Payer: Self-pay | Admitting: Cardiology

## 2020-01-15 VITALS — BP 144/80 | HR 75 | Ht 70.0 in | Wt 251.0 lb

## 2020-01-15 DIAGNOSIS — I10 Essential (primary) hypertension: Secondary | ICD-10-CM | POA: Diagnosis not present

## 2020-01-15 DIAGNOSIS — I4821 Permanent atrial fibrillation: Secondary | ICD-10-CM | POA: Diagnosis not present

## 2020-01-15 NOTE — Patient Instructions (Addendum)
Your physician wants you to follow-up in: [place] will receive a reminder letter in the mail two months in advance. If you don't receive a letter, please call our office to schedule the follow-up appointment.  Your physician recommends that you continue on your current medications as directed. Please refer to the Current Medication list given to you today.  Thank you for choosing [facility]!!

## 2020-01-15 NOTE — Progress Notes (Signed)
Cardiology Office Note  Date: 01/15/2020   ID: Arthur LECLAIRE, DOB [birth year], MRN [id number]  PCP:  Asencion Noble, MD  Cardiologist:  Rozann Lesches, MD Electrophysiologist:  None   Chief Complaint  Patient presents with  . Atrial Fibrillation    History of Present Illness: Arthur Wilson is a 74 y.o. male referred for cardiology consultation by Dr. Willey Blade for the evaluation of atrial fibrillation.  He states that some time around May he noticed that his resting heart rate was higher than usual based on recordings made by his Fitbit.  In the past he had been more bradycardic, on Ziac for control of hypertension.  More recently heart rates are in the 70s to 80s at rest and he was found to be in atrial fibrillation by Dr. Willey Blade per ECG in August.  He does not describe any sense of palpitations, no major functional status changes, no progressive shortness of breath or chest pain.  Echocardiogram from August of this year revealed LVEF 60 to 65%, normal RV contraction, moderately dilated left atrium, otherwise no major valvular abnormalities.  CHA2DS2-VASc score is 3.  He is on Eliquis for stroke prophylaxis, tolerating well without bleeding problems.  We are requesting his lab work from Dr. Willey Blade.  I personally reviewed his ECG today which shows rate controlled atrial fibrillation at 75 bpm.  We discussed management strategies for atrial fibrillation including heart rate control and anticoagulation versus attempts at maintaining sinus rhythm.  Since he is essentially asymptomatic and his heart rate is well controlled at the present time, in addition to having a moderately dilated left atrium and also sleep apnea, plan at this point is management as permanent atrial fibrillation.  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  States that he enjoys his work very much.  Past Medical History:  Diagnosis Date  . Atrial fibrillation ([place])   . BPH (benign  prostatic hyperplasia)   . Cervical spondylosis without myelopathy   . Essential hypertension   . Impaired glucose tolerance   . OSA on CPAP   . Testicular hypofunction     Past Surgical History:  Procedure Laterality Date  . FOOT SURGERY  01/10/2016   Bunion removal  . KNEE SURGERY  1992   removal of ACL from ski fall    Current Outpatient Medications  Medication Sig Dispense Refill  . ALPRAZolam (XANAX) 0.5 MG tablet Take 0.5 tablets by mouth at bedtime as needed.     . bisoprolol-hydrochlorothiazide (ZIAC) 5-6.25 MG tablet Take 1 tablet by mouth daily.    Marland Kitchen ELIQUIS 5 MG TABS tablet Take 5 mg by mouth 2 (two) times daily.    . fexofenadine (ALLEGRA) 180 MG tablet Take 180 mg by mouth daily.    . fluticasone (FLONASE) 50 MCG/ACT nasal spray Place 1 spray into both nostrils as needed for allergies.    Marland Kitchen HYDROcodone-acetaminophen (NORCO/VICODIN) 5-325 MG tablet Take 1 tablet by mouth every 6 (six) hours as needed for moderate pain.    . pregabalin (LYRICA) 100 MG capsule TAKE (1) CAPSULE BY MOUTH TWICE DAILY. 180 capsule 1   No current facility-administered medications for this visit.   Allergies:  Patient has no known allergies.   Social History: The patient  reports that he has never smoked. He has never used smokeless tobacco. He reports that he does not drink alcohol and does not use drugs.   Family History: The patient's family history includes Breast cancer in his mother  and sister; Heart attack in his father; Hypertension in his father and paternal grandfather; Prostate cancer in his paternal uncle.   ROS: No dizziness or syncope.  Physical Exam: VS:  BP (!) 144/80   Pulse 75   Ht 5\' 10"  (1.778 m)   Wt 251 lb (113.9 kg)   SpO2 94%   BMI 36.01 kg/m , BMI Body mass index is 36.01 kg/m.  Wt Readings from Last 3 Encounters:  01/15/20 251 lb (113.9 kg)  05/01/19 248 lb 8 oz (112.7 kg)  02/27/19 238 lb (108 kg)    General: Patient appears comfortable at  rest. HEENT: Conjunctiva and lids normal, wearing a mask. Neck: Supple, no elevated JVP or carotid bruits, no thyromegaly. Lungs: Clear to auscultation, nonlabored breathing at rest. Cardiac: Irregularly irregular, no S3 or significant systolic murmur, no pericardial rub. Abdomen: Soft, nontender, bowel sounds present. Extremities: No pitting edema, distal pulses 2+. Skin: Warm and dry. Musculoskeletal: No kyphosis. Neuropsychiatric: Alert and oriented x3, affect grossly appropriate.  ECG:  An ECG dated 11/14/2019 was personally reviewed today and demonstrated:  Rate controlled atrial fibrillation with decreased R wave progression.  Recent Labwork:  No recent lab work for review today.  Other Studies Reviewed Today:  Echocardiogram 11/05/2019: 1. Left ventricular ejection fraction, by estimation, is 60 to 65%. The  left ventricle has normal function. The left ventricle has no regional  wall motion abnormalities. There is mild left ventricular hypertrophy.  Left ventricular diastolic parameters  are indeterminate.  2. Right ventricular systolic function is normal. The right ventricular  size is normal.  3. Left atrial size was moderately dilated.  4. Right atrial size was moderately dilated.  5. The mitral valve is normal in structure. No evidence of mitral valve  regurgitation. No evidence of mitral stenosis.  6. The aortic valve is tricuspid. Aortic valve regurgitation is not  visualized. No aortic stenosis is present.  7. Mild pulmonary HTN, PASP is 36 mmHg.  8. The inferior vena cava is normal in size with greater than 50%  respiratory variability, suggesting right atrial pressure of 3 mmHg.   Assessment and Plan:  1.  Atrial fibrillation, onset possibly sometime around May as discussed above, asymptomatic in terms of palpitations or other exertional symptoms.  His heart rate is controlled on Ziac and he is now on Eliquis for stroke prophylaxis with CHA2DS2-VASc score  of 3.  As noted above we have discussed various management strategies, for now we will manage as permanent atrial fibrillation.  Requesting lab work from Dr. Willey Blade.  He is a moderately dilated left atrium and also history of OSA and hypertension.  Likelihood of recurrent atrial fibrillation even if we did pursue a cardioversion is fairly high.  2.  Essential hypertension, on Ziac.  He continues to follow with Dr. Willey Blade.  Medication Adjustments/Labs and Tests Ordered: Current medicines are reviewed at length with the patient today.  Concerns regarding medicines are outlined above.   Tests Ordered: Orders Placed This Encounter  Procedures  . EKG 12-Lead    Medication Changes: No orders of the defined types were placed in this encounter.   Disposition:  Follow up 6 months with me in the [place] office.  Signed, Satira Sark, MD, [facility] 01/15/2020 3:32 PM    [facility] [place], [place], [place] [address] Phone: [phone]; Fax: [phone]

## 2020-02-25 ENCOUNTER — Ambulatory Visit: Payer: Medicare Other | Admitting: Cardiology

## 2020-04-29 ENCOUNTER — Other Ambulatory Visit: Payer: Self-pay | Admitting: Neurology

## 2020-05-10 ENCOUNTER — Encounter: Payer: Self-pay | Admitting: Neurology

## 2020-05-10 ENCOUNTER — Ambulatory Visit: Payer: Medicare Other | Admitting: Neurology

## 2020-05-10 VITALS — BP 129/79 | HR 80 | Ht 70.0 in | Wt 262.0 lb

## 2020-05-10 DIAGNOSIS — M47812 Spondylosis without myelopathy or radiculopathy, cervical region: Secondary | ICD-10-CM | POA: Diagnosis not present

## 2020-05-10 NOTE — Progress Notes (Signed)
Reason for visit: Cervical spondylosis  Arthur Wilson is an 75 y.o. male  History of present illness:  Arthur Wilson is a 75 year old right-handed white male with a history of cervical spondylosis and some intermittent neck pain.  He takes Lyrica 100 mg twice daily which seems to help quite a bit, he rarely has burning discomfort in the back of the neck.  He is under some stress recently as his wife was diagnosed with lung cancer.  He also recently was diagnosed with atrial fibrillation.  He has some knee arthritis, particularly on the left that is bothersome to him.  He continues to actively work.  He returns to the office today for an evaluation.  Past Medical History:  Diagnosis Date  . A-fib ([place])   . Atrial fibrillation ([place])   . BPH (benign prostatic hyperplasia)   . Cervical spondylosis without myelopathy   . Essential hypertension   . Impaired glucose tolerance   . OSA on CPAP   . Testicular hypofunction     Past Surgical History:  Procedure Laterality Date  . FOOT SURGERY  01/10/2016   Bunion removal  . KNEE SURGERY  1992   removal of ACL from ski fall    Family History  Problem Relation Age of Onset  . Breast cancer Mother   . Hypertension Father   . Heart attack Father   . Prostate cancer Paternal Uncle   . Hypertension Paternal Grandfather   . Breast cancer Sister     Social history:  reports that he has never smoked. He has never used smokeless tobacco. He reports that he does not drink alcohol and does not use drugs.   No Known Allergies  Medications:  Prior to Admission medications   Medication Sig Start Date End Date Taking? Authorizing Provider  ALPRAZolam Duanne Moron) 0.5 MG tablet Take 0.5 tablets by mouth at bedtime as needed.  07/05/18  Yes [provider]  bisoprolol-hydrochlorothiazide (ZIAC) 5-6.25 MG tablet Take 1 tablet by mouth daily. 02/17/19  Yes [provider]  ELIQUIS 5 MG TABS tablet Take 5 mg by mouth 2 (two) times  daily. 12/31/19  Yes [provider]  fexofenadine (ALLEGRA) 180 MG tablet Take 180 mg by mouth daily.   Yes [provider]  fluticasone (FLONASE) 50 MCG/ACT nasal spray Place 1 spray into both nostrils as needed for allergies. 01/12/20  Yes [provider]  HYDROcodone-acetaminophen (NORCO/VICODIN) 5-325 MG tablet Take 1 tablet by mouth every 6 (six) hours as needed for moderate pain.   Yes [provider]  pregabalin (LYRICA) 100 MG capsule TAKE (1) CAPSULE BY MOUTH TWICE DAILY. 04/29/20  Yes Kathrynn Ducking, MD    ROS:  Out of a complete 14 system review of symptoms, the patient complains only of the following symptoms, and all other reviewed systems are negative.  Neck pain Arthritis discomfort  Blood pressure 129/79, pulse 80, height 5\' 10"  (1.778 m), weight 262 lb (118.8 kg).  Physical Exam  General: The patient is alert and cooperative at the time of the examination.  The patient is moderately obese.  Skin: No significant peripheral edema is noted.   Neurologic Exam  Mental status: The patient is alert and oriented x 3 at the time of the examination. The patient has apparent normal recent and remote memory, with an apparently normal attention span and concentration ability.   Cranial nerves: Facial symmetry is present. Speech is normal, no aphasia or dysarthria is noted. Extraocular movements are  full. Visual fields are full.  Motor: The patient has good strength in all 4 extremities.  Sensory examination: Soft touch sensation is symmetric on the face, arms, and legs.  Coordination: The patient has good finger-nose-finger and heel-to-shin bilaterally.  Gait and station: The patient has a normal gait. Tandem gait is slightly unsteady. Romberg is negative. No drift is seen.  Reflexes: Deep tendon reflexes are symmetric.   Assessment/Plan:  1.  Cervical spondylosis  The patient is doing relatively well with his neck discomfort.  He  has a prescription for the Lyrica which seems to be beneficial, occasionally he may take half of a hydrocodone if needed.  He will follow up here in 1 year, sooner if needed.  Jill Alexanders MD 05/10/2020 12:27 PM  [facility] [address] [place], [place] [address]  Phone [phone] Fax [phone]

## 2020-09-07 ENCOUNTER — Telehealth: Payer: Self-pay | Admitting: Cardiology

## 2020-09-07 NOTE — Telephone Encounter (Signed)
What dental office are you calling from? [facility]  What is your office phone number? [phone]  What is your fax number?[phone]  What type of procedure is the patient having performed? Patient is having tooth extracted   What date is procedure scheduled or is the patient there now? tbd   What is your question (ex. Antibiotics prior to procedure, holding medication-we need to know how long dentist wants pt to hold med)? Patient needs to hold eliquis for how long prior surgery?  (If the patient is currently at the dentist's office, call Pre-Op APP to address. If the patient is not currently in the dentist office, please route to the Pre-Op pool)

## 2020-09-07 NOTE — Telephone Encounter (Signed)
   Patient Name: Arthur Wilson  DOB: Nov 21, 1945  MRN: [id number]   Primary Cardiologist: Rozann Lesches, MD  Chart reviewed as part of pre-operative protocol coverage.   Simple dental extractions (1-2 teeth) are considered low risk procedures per guidelines and generally do not require any specific cardiac clearance. It is also generally accepted that for simple extractions and dental cleanings, there is no need to interrupt blood thinner therapy.  If more than 2 teeth need to be extracted, please re-send the form for re-review as the patient would then need to hold anticoagulation.   SBE prophylaxis is not required for the patient from a cardiac standpoint.  I will route this recommendation to the requesting party via Epic fax function and remove from pre-op pool.  Please call with questions.  Kathyrn Drown, NP 09/07/2020, 10:22 AM

## 2020-10-26 ENCOUNTER — Other Ambulatory Visit: Payer: Self-pay | Admitting: Neurology

## 2021-01-27 ENCOUNTER — Ambulatory Visit: Payer: Medicare Other | Admitting: Student

## 2021-01-27 ENCOUNTER — Encounter: Payer: Self-pay | Admitting: Student

## 2021-01-27 ENCOUNTER — Other Ambulatory Visit: Payer: Self-pay | Admitting: Neurology

## 2021-01-27 ENCOUNTER — Other Ambulatory Visit: Payer: Self-pay

## 2021-01-27 VITALS — BP 124/68 | HR 93 | Ht 70.0 in | Wt 248.0 lb

## 2021-01-27 DIAGNOSIS — I10 Essential (primary) hypertension: Secondary | ICD-10-CM | POA: Diagnosis not present

## 2021-01-27 DIAGNOSIS — I4821 Permanent atrial fibrillation: Secondary | ICD-10-CM

## 2021-01-27 DIAGNOSIS — R600 Localized edema: Secondary | ICD-10-CM

## 2021-01-27 DIAGNOSIS — Z9989 Dependence on other enabling machines and devices: Secondary | ICD-10-CM

## 2021-01-27 DIAGNOSIS — G4733 Obstructive sleep apnea (adult) (pediatric): Secondary | ICD-10-CM

## 2021-01-27 NOTE — Progress Notes (Signed)
Cardiology Office Note    Date:  01/27/2021   ID:  Arthur Wilson, DOB Dec 24, 1945, MRN [id number]  PCP:  Asencion Noble, MD  Cardiologist: Rozann Lesches, MD    Chief Complaint  Patient presents with   Follow-up    Routine Visit    History of Present Illness:    Arthur Wilson is a 75 y.o. male with past medical history of permanent atrial fibrillation, HTN and OSA who presents to the office today for overdue follow-up.   He was last examined by Dr. Domenic Polite in 12/2019 as a new patient referral after having been diagnosed with atrial fibrillation by his PCP several months prior. He was already on Eliquis and tolerating well with rates remaining well-controlled. Given he was overall asymptomatic and due to his OSA and atrial dilation by recent echo, a rate-control strategy was recommended.   In talking with the patient today, he reports overall doing well from a cardiac perspective since his last visit. He denies any recent palpitations or chest pain. He does check his HR on his FitBit and it is typically in the 70's to 80's. The highest recording he has noticed is 114 bpm. His respiratory status has been stable and he denies any specific orthopnea or PND. Uses his CPAP on a nightly basis. He does experience occasional lower extremity with prolonged sitting or standing and uses compression stockings on a daily basis.    Past Medical History:  Diagnosis Date   A-fib [facility])    Atrial fibrillation (HCC)    BPH (benign prostatic hyperplasia)    Cervical spondylosis without myelopathy    Essential hypertension    Impaired glucose tolerance    OSA on CPAP    Testicular hypofunction     Past Surgical History:  Procedure Laterality Date   FOOT SURGERY  01/10/2016   Bunion removal   KNEE SURGERY  1992   removal of ACL from ski fall    Current Medications: Outpatient Medications Prior to Visit  Medication Sig Dispense Refill   ALPRAZolam (XANAX) 0.5 MG tablet Take 0.5 tablets by  mouth at bedtime as needed.      bisoprolol-hydrochlorothiazide (ZIAC) 5-6.25 MG tablet Take 1 tablet by mouth daily.     ELIQUIS 5 MG TABS tablet Take 5 mg by mouth 2 (two) times daily.     fexofenadine (ALLEGRA) 180 MG tablet Take 180 mg by mouth daily.     fluticasone (FLONASE) 50 MCG/ACT nasal spray Place 1 spray into both nostrils as needed for allergies.     HYDROcodone-acetaminophen (NORCO/VICODIN) 5-325 MG tablet Take 1 tablet by mouth every 6 (six) hours as needed for moderate pain.     pregabalin (LYRICA) 100 MG capsule TAKE (1) CAPSULE BY MOUTH TWICE DAILY. 180 capsule 1   No facility-administered medications prior to visit.     Allergies:   Patient has no known allergies.   Social History   Socioeconomic History   Marital status: Married    Spouse name: Manuela Schwartz   Number of children: 3   Years of education: BS-Pharm   Highest education level: Not on file  Occupational History   Occupation: pharmacist     Comment: Agricultural engineer  Tobacco Use   Smoking status: Never   Smokeless tobacco: Never  Vaping Use   Vaping Use: Never used  Substance and Sexual Activity   Alcohol use: No   Drug use: No   Sexual activity: Not on file  Other Topics Concern  Not on file  Social History Narrative   Lives with wife   Patient is right handed.   Patient drinks caffeine a few times a week.   Social Determinants of Health   Financial Resource Strain: Not on file  Food Insecurity: Not on file  Transportation Needs: Not on file  Physical Activity: Not on file  Stress: Not on file  Social Connections: Not on file     Family History:  The patient'sfamily history includes Breast cancer in his mother and sister; Heart attack in his father; Hypertension in his father and paternal grandfather; Prostate cancer in his paternal uncle.   Review of Systems:    Please see the history of present illness.     All other systems reviewed and are otherwise negative except as noted  above.   Physical Exam:    VS:  BP 124/68   Pulse 93   Ht 5\' 10"  (1.778 m)   Wt 248 lb (112.5 kg)   SpO2 95%   BMI 35.58 kg/m    General: Well developed, well nourished,male appearing in no acute distress. Head: Normocephalic, atraumatic. Neck: No carotid bruits. JVD not elevated.  Lungs: Respirations regular and unlabored, without wheezes or rales.  Heart: Irregularly irregularly. No S3 or S4.  No murmur, no rubs, or gallops appreciated. Abdomen: Appears non-distended. No obvious abdominal masses. Msk:  Strength and tone appear normal for age. No obvious joint deformities or effusions. Extremities: No clubbing or cyanosis. 1+ edema with compression stockings in place.  Distal pedal pulses are 2+ bilaterally. Neuro: Alert and oriented X 3. Moves all extremities spontaneously. No focal deficits noted. Psych:  Responds to questions appropriately with a normal affect. Skin: No rashes or lesions noted  Wt Readings from Last 3 Encounters:  01/27/21 248 lb (112.5 kg)  05/10/20 262 lb (118.8 kg)  01/15/20 251 lb (113.9 kg)     Studies/Labs Reviewed:   EKG:  EKG is ordered today.  The ekg ordered today demonstrates atrial fibrillation, HR 105 with RAD. No acute ST changes when compared to prior tracings.   Recent Labs: No results found for requested labs within last 8760 hours.   Lipid Panel No results found for: CHOL, TRIG, HDL, CHOLHDL, VLDL, LDLCALC, LDLDIRECT  Additional studies/ records that were reviewed today include:   Echocardiogram: 11/05/2019 IMPRESSIONS     1. Left ventricular ejection fraction, by estimation, is 60 to 65%. The  left ventricle has normal function. The left ventricle has no regional  wall motion abnormalities. There is mild left ventricular hypertrophy.  Left ventricular diastolic parameters  are indeterminate.   2. Right ventricular systolic function is normal. The right ventricular  size is normal.   3. Left atrial size was moderately  dilated.   4. Right atrial size was moderately dilated.   5. The mitral valve is normal in structure. No evidence of mitral valve  regurgitation. No evidence of mitral stenosis.   6. The aortic valve is tricuspid. Aortic valve regurgitation is not  visualized. No aortic stenosis is present.   7. Mild pulmonary HTN, PASP is 36 mmHg.   8. The inferior vena cava is normal in size with greater than 50%  respiratory variability, suggesting right atrial pressure of 3 mmHg.   Assessment:    1. Permanent atrial fibrillation ([place])   2. Bilateral lower extremity edema   3. Essential hypertension   4. OSA on CPAP      Plan:   In order of problems listed  above:  1. Permanent Atrial Fibrillation - A rate-control strategy was previously recommended given his asymptomatic state, OSA and atrial dilation by prior echocardiogram. Rates initially in the low-100's today but in the 90's on recheck and they have been well-controlled per his FitBit and peak in the 110's which is rare. He remains on Bisoprolol 5mg  daily for now and I encouraged him to make [place] aware if his HR starts to trend above 100 bpm as this could be further titrated.  - No reports of active bleeding. He remains on Eliquis 5mg  BID for anticoagulation which is the appropriate dose at this time given his weight and age. Hgb and platelets were within normal limits when checked in 10/2020.  2. Lower Extremity Edema - He does have 1+ edema on examination today but says he has been sitting at a desk a majority of the day. He continues to use compression stockings and was encouraged to do so. He is currently taking HCTZ 6.25mg  daily and wishes to continue at his current dosing for now but this could be titrated in the future if needed. His creatinine was stable at 1.12 in 10/2020.  3. HTN - His blood pressure is well-controlled at 124/68 during today's visit. Continue current medication regimen with Ziac 5-6.25mg  daily.   4. OSA - Continued  compliance with CPAP encouraged.   Medication Adjustments/Labs and Tests Ordered: Current medicines are reviewed at length with the patient today.  Concerns regarding medicines are outlined above.  Medication changes, Labs and Tests ordered today are listed in the Patient Instructions below. Patient Instructions  Medication Instructions:     Your physician recommends that you continue on your current medications as directed. Please refer to the Current Medication list given to you today.  *If you need a refill on your cardiac medications before your next appointment, please call your pharmacy*   Lab Work:  None today  Testing/Procedures: None today    Follow-Up: At [facility], you and your health needs are our priority.  As part of our continuing mission to provide you with exceptional heart care, we have created designated Provider Care Teams.  These Care Teams include your primary Cardiologist (physician) and Advanced Practice Providers (APPs -  Physician Assistants and Nurse Practitioners) who all work together to provide you with the care you need, when you need it.  We recommend signing up for the patient portal called "MyChart".  Sign up information is provided on this After Visit Summary.  MyChart is used to connect with patients for Virtual Visits (Telemedicine).  Patients are able to view lab/test results, encounter notes, upcoming appointments, etc.  Non-urgent messages can be sent to your provider as well.   To learn more about what you can do with MyChart, go to [email or web address].    Your next appointment:   6 month(s)  The format for your next appointment:   In Person  Provider:   Rozann Lesches, MD   Other Instructions None    Signed, WANG GRANADA, PA-C  01/27/2021 7:20 PM    [place]. [address] [place], [place] [address] Phone: [phone] Fax: [phone]

## 2021-01-27 NOTE — Patient Instructions (Signed)
Medication Instructions:     Your physician recommends that you continue on your current medications as directed. Please refer to the Current Medication list given to you today.  *If you need a refill on your cardiac medications before your next appointment, please call your pharmacy*   Lab Work:  None today  Testing/Procedures: None today    Follow-Up: At [facility], you and your health needs are our priority.  As part of our continuing mission to provide you with exceptional heart care, we have created designated Provider Care Teams.  These Care Teams include your primary Cardiologist (physician) and Advanced Practice Providers (APPs -  Physician Assistants and Nurse Practitioners) who all work together to provide you with the care you need, when you need it.  We recommend signing up for the patient portal called "MyChart".  Sign up information is provided on this After Visit Summary.  MyChart is used to connect with patients for Virtual Visits (Telemedicine).  Patients are able to view lab/test results, encounter notes, upcoming appointments, etc.  Non-urgent messages can be sent to your provider as well.   To learn more about what you can do with MyChart, go to [email or web address].    Your next appointment:   6 month(s)  The format for your next appointment:   In Person  Provider:   Rozann Lesches, MD   Other Instructions None

## 2021-02-11 ENCOUNTER — Ambulatory Visit: Payer: Medicare Other | Admitting: Cardiology

## 2021-03-08 ENCOUNTER — Ambulatory Visit: Payer: Medicare Other | Admitting: Physician Assistant

## 2021-04-04 NOTE — Progress Notes (Signed)
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  Patient was last seen 01/27/2021 by Bernerd Pho, PA-C and was having some lower extremity edema but wanted to continue his current dose HCTZ.  He had been sitting at his desk the majority of the day.  Heart rate was in the low 100s but he just was keeping track of it on his Fitbit.  Patient comes in for f/u. Now on benicar for BP and kidney protective. HR still goes up if he goes up a flight of stairs-115 is highest. Is on Ozempic for prediabetes. Has lost weight on it as it has decreased his appetite. Still working, rides exercise bike 30-45 min 3 days a week. Bruises easily but no significant bleeding.     Past Medical History:  Diagnosis Date   A-fib [facility])    Atrial fibrillation (HCC)    BPH (benign prostatic hyperplasia)    Cervical spondylosis without myelopathy    Essential hypertension    Impaired glucose tolerance    OSA on CPAP    Testicular hypofunction     Past Surgical History:  Procedure Laterality Date   FOOT SURGERY  01/10/2016   Bunion removal   KNEE SURGERY  1992   removal of ACL from ski fall    Current Medications: Current Meds  Medication Sig   ALPRAZolam (XANAX) 0.5 MG tablet Take 0.5 tablets by mouth at bedtime as needed.    bisoprolol-hydrochlorothiazide (ZIAC) 5-6.25 MG tablet Take 1 tablet by mouth daily.   ELIQUIS 5 MG TABS tablet Take 5 mg by mouth 2 (two) times daily.    fexofenadine (ALLEGRA) 180 MG tablet Take 180 mg by mouth daily.   fluticasone (FLONASE) 50 MCG/ACT nasal spray Place 1 spray into both nostrils as needed for allergies.   HYDROcodone-acetaminophen (NORCO/VICODIN) 5-325 MG tablet Take 1 tablet by mouth every 6 (six) hours as needed for moderate pain.   olmesartan (BENICAR) 20 MG tablet Take 20 mg by mouth daily.   OZEMPIC, 0.25 OR 0.5 MG/DOSE, 2 MG/1.5ML SOPN Inject 0.5 mg into the skin once a week.   pregabalin (LYRICA) 100 MG capsule TAKE (1) CAPSULE BY MOUTH TWICE DAILY.     Allergies:   Patient has no known allergies.   Social History   Socioeconomic History   Marital status: Married    Spouse name: Arthur Wilson   Number of children: 3   Years of education: BS-Pharm   Highest education level: Not on file  Occupational History   Occupation: pharmacist     Comment: Agricultural engineer  Tobacco Use   Smoking status: Never   Smokeless tobacco: Never  Vaping Use   Vaping Use: Never used  Substance and Sexual Activity   Alcohol use: No   Drug use: No   Sexual activity: Not on file  Other Topics Concern   Not on file  Social History Narrative   Lives with wife   Patient is  right handed.   Patient drinks caffeine a few times a week.   Social Determinants of Health   Financial Resource Strain: Not on file  Food Insecurity: Not on file  Transportation Needs: Not on file  Physical Activity: Not on file  Stress: Not on file  Social Connections: Not on file     Family History:  The patient's  family history includes Breast cancer in his mother and sister; Heart attack in his father; Hypertension in his father and paternal grandfather; Prostate cancer in his paternal uncle.   ROS:   Please see the history of present illness.    ROS All other systems reviewed and are negative.   PHYSICAL EXAM:   VS:  BP 110/64    Pulse 86    Ht 5\' 10"  (1.778 m)    Wt 245 lb (111.1 kg)    SpO2 99%    BMI 35.15 kg/m   Physical Exam  GEN: Obese,  in no acute distress  Neck: no JVD, carotid bruits, or masses Cardiac:irreg irreg; no murmurs, rubs, or gallops  Respiratory:  clear to auscultation bilaterally, normal work of breathing GI: soft, nontender, nondistended, + BS Ext: without cyanosis, clubbing, or edema, Good distal pulses bilaterally Neuro:  Alert and Oriented x 3, Psych: euthymic mood, full affect  Wt Readings from Last 3 Encounters:  04/11/21 245 lb (111.1 kg)  01/27/21 248 lb (112.5 kg)  05/10/20 262 lb (118.8 kg)      Studies/Labs Reviewed:   EKG:  EKG is not ordered today.     Recent Labs: No results found for requested labs within last 8760 hours.   Lipid Panel No results found for: CHOL, TRIG, HDL, CHOLHDL, VLDL, LDLCALC, LDLDIRECT  Additional studies/ records that were reviewed today include:     Echocardiogram: 11/05/2019 IMPRESSIONS     1. Left ventricular ejection fraction, by estimation, is 60 to 65%. The  left ventricle has normal function. The left ventricle has no regional  wall motion abnormalities. There is mild left ventricular hypertrophy.  Left ventricular diastolic parameters  are indeterminate.   2. Right ventricular systolic function is normal. The right ventricular  size is normal.   3. Left atrial size was moderately dilated.   4. Right atrial size was moderately dilated.   5. The mitral valve is normal in structure. No evidence of mitral valve  regurgitation. No evidence of mitral stenosis.   6. The aortic valve is tricuspid. Aortic valve regurgitation is not  visualized. No aortic stenosis is present.   7. Mild pulmonary HTN, PASP is 36 mmHg.   8. The inferior vena cava is normal in size with greater than 50%  respiratory variability, suggesting right atrial pressure of 3 mmHg.      Risk Assessment/Calculations:    CHA2DS2-VASc Score = 3   This indicates a 3.2% annual risk of stroke. The patient's score is based upon: CHF History: 0 HTN History: 1 Diabetes History:  1 Stroke History: 0 Vascular Disease History: 0 Age Score: 1 Gender Score: 0        ASSESSMENT:    1. Permanent atrial fibrillation ([place])   2. Essential hypertension   3. Bilateral lower extremity edema   4. OSA on CPAP   5. Edema, lower extremity      PLAN:  In order of problems listed above:  Permanent atrial fibrillation on eliquis and low dose bisoprolol. HR up some with activity but overall controlled. No bleeding problems on eliquis. Labs stable  10/2020 and to be rechecked by Dr. Willey Blade next month  Hypertension-well controlled. Benicar recently started by PCP  OSA compliant on CPAP  Obesity has lost some weight on Ozempic for prediabetes. Continued weight loss and increase exercise 150 min weekly.  LE edema uses compression stockings   Shared Decision Making/Informed Consent        Medication Adjustments/Labs and Tests Ordered: Current medicines are reviewed at length with the patient today.  Concerns regarding medicines are outlined above.  Medication changes, Labs and Tests ordered today are listed in the Patient Instructions below. Patient Instructions  Medication Instructions:  Your physician recommends that you continue on your current medications as directed. Please refer to the Current Medication list given to you today.   Labwork: None today  Testing/Procedures: None today  Follow-Up: 6 months  Any Other Special Instructions Will Be Listed Below (If Applicable).    Exercise 150 minutes weekly    Continue weight loss      If you need a refill on your cardiac medications before your next appointment, please call your pharmacy.    Signed, Ermalinda Barrios, PA-C  04/11/2021 2:08 PM    [facility] [place], [place],   [address] Phone: [phone]; Fax: [phone]

## 2021-04-11 ENCOUNTER — Encounter: Payer: Self-pay | Admitting: Physician Assistant

## 2021-04-11 ENCOUNTER — Ambulatory Visit: Payer: Medicare Other | Admitting: Physician Assistant

## 2021-04-11 VITALS — BP 110/64 | HR 86 | Ht 70.0 in | Wt 245.0 lb

## 2021-04-11 DIAGNOSIS — R600 Localized edema: Secondary | ICD-10-CM

## 2021-04-11 DIAGNOSIS — Z9989 Dependence on other enabling machines and devices: Secondary | ICD-10-CM

## 2021-04-11 DIAGNOSIS — I10 Essential (primary) hypertension: Secondary | ICD-10-CM | POA: Diagnosis not present

## 2021-04-11 DIAGNOSIS — I4821 Permanent atrial fibrillation: Secondary | ICD-10-CM | POA: Diagnosis not present

## 2021-04-11 DIAGNOSIS — G4733 Obstructive sleep apnea (adult) (pediatric): Secondary | ICD-10-CM | POA: Diagnosis not present

## 2021-04-11 NOTE — Patient Instructions (Signed)
Medication Instructions:  Your physician recommends that you continue on your current medications as directed. Please refer to the Current Medication list given to you today.   Labwork: None today  Testing/Procedures: None today  Follow-Up: 6 months  Any Other Special Instructions Will Be Listed Below (If Applicable).    Exercise 150 minutes weekly    Continue weight loss      If you need a refill on your cardiac medications before your next appointment, please call your pharmacy.

## 2021-05-04 DIAGNOSIS — H18602 Keratoconus, unspecified, left eye: Secondary | ICD-10-CM | POA: Diagnosis not present

## 2021-05-04 DIAGNOSIS — H25013 Cortical age-related cataract, bilateral: Secondary | ICD-10-CM | POA: Diagnosis not present

## 2021-05-04 DIAGNOSIS — H01006 Unspecified blepharitis left eye, unspecified eyelid: Secondary | ICD-10-CM | POA: Diagnosis not present

## 2021-05-04 DIAGNOSIS — H2513 Age-related nuclear cataract, bilateral: Secondary | ICD-10-CM | POA: Diagnosis not present

## 2021-05-12 ENCOUNTER — Encounter: Payer: Self-pay | Admitting: Neurology

## 2021-05-12 ENCOUNTER — Telehealth: Payer: Medicare Other | Admitting: Neurology

## 2021-05-12 DIAGNOSIS — M47812 Spondylosis without myelopathy or radiculopathy, cervical region: Secondary | ICD-10-CM | POA: Diagnosis not present

## 2021-05-12 MED ORDER — PREGABALIN 100 MG PO CAPS: ORAL_CAPSULE | ORAL | 1 refills | Status: DC

## 2021-05-12 NOTE — Progress Notes (Signed)
° °  Virtual Visit via Video Note  I connected with Arthur Wilson on 05/12/21 at  2:45 PM EST by a video enabled telemedicine application and verified that I am speaking with the correct person using two identifiers.  Location: Patient: at his home Provider: in the office    I discussed the limitations of evaluation and management by telemedicine and the availability of in person appointments. The patient expressed understanding and agreed to proceed.  History of Present Illness: 05/12/2021 SS: Arthur Wilson here today for follow-up via virtual visit with history of cervical spondylosis and neck pain.  He is on Lyrica from our office for neck pain/burning sensation. He currently has COVID is on anti-virals. Gets hydrocodone from primary care doctor, Dr. Karie Kirks, never takes more than 1/2 tablet. With the Lyrica, he very seldomly gets burning. He is on Eliquis for AFIB. Knows he needs to be more active.   05/10/2020 Dr. Jannifer Franklin: Arthur Wilson is a 76 year old right-handed white male with a history of cervical spondylosis and some intermittent neck pain.  He takes Lyrica 100 mg twice daily which seems to help quite a bit, he rarely has burning discomfort in the back of the neck.  He is under some stress recently as his wife was diagnosed with lung cancer.  He also recently was diagnosed with atrial fibrillation.  He has some knee arthritis, particularly on the left that is bothersome to him.  He continues to actively work.  He returns to the office today for an evaluation.   Observations/Objective: Via virtual visit, alert and oriented, speech is clear and concise, moves about freely   Assessment and Plan: Cervical spondylosis   -Symptoms under good control, Continue Lyrica 100 mg twice daily , I sent in refill for 6 months, last fill was 01/27/21 # 180 -Refills from PCP in the future, they also provide hydrocodone PRN -Return back here as needed   Follow Up Instructions: PRN    I discussed the  assessment and treatment plan with the patient. The patient was provided an opportunity to ask questions and all were answered. The patient agreed with the plan and demonstrated an understanding of the instructions.   The patient was advised to call back or seek an in-person evaluation if the symptoms worsen or if the condition fails to improve as anticipated.  Evangeline Dakin, DNP  [facility] [address] [place], [place] [address] [phone]

## 2021-05-17 DIAGNOSIS — R3121 Asymptomatic microscopic hematuria: Secondary | ICD-10-CM | POA: Diagnosis not present

## 2021-05-17 DIAGNOSIS — R809 Proteinuria, unspecified: Secondary | ICD-10-CM | POA: Diagnosis not present

## 2021-05-17 DIAGNOSIS — E1129 Type 2 diabetes mellitus with other diabetic kidney complication: Secondary | ICD-10-CM | POA: Diagnosis not present

## 2021-05-17 DIAGNOSIS — I10 Essential (primary) hypertension: Secondary | ICD-10-CM | POA: Diagnosis not present

## 2021-05-23 DIAGNOSIS — E1122 Type 2 diabetes mellitus with diabetic chronic kidney disease: Secondary | ICD-10-CM | POA: Diagnosis not present

## 2021-05-23 DIAGNOSIS — I4821 Permanent atrial fibrillation: Secondary | ICD-10-CM | POA: Diagnosis not present

## 2021-05-23 DIAGNOSIS — R7309 Other abnormal glucose: Secondary | ICD-10-CM | POA: Diagnosis not present

## 2021-05-23 DIAGNOSIS — I10 Essential (primary) hypertension: Secondary | ICD-10-CM | POA: Diagnosis not present

## 2021-05-26 DIAGNOSIS — N401 Enlarged prostate with lower urinary tract symptoms: Secondary | ICD-10-CM | POA: Diagnosis not present

## 2021-05-26 DIAGNOSIS — R972 Elevated prostate specific antigen [PSA]: Secondary | ICD-10-CM | POA: Diagnosis not present

## 2021-05-26 DIAGNOSIS — N138 Other obstructive and reflux uropathy: Secondary | ICD-10-CM | POA: Diagnosis not present

## 2021-05-26 DIAGNOSIS — Z8042 Family history of malignant neoplasm of prostate: Secondary | ICD-10-CM | POA: Diagnosis not present

## 2021-05-26 DIAGNOSIS — E291 Testicular hypofunction: Secondary | ICD-10-CM | POA: Diagnosis not present

## 2021-06-01 DIAGNOSIS — M25561 Pain in right knee: Secondary | ICD-10-CM | POA: Diagnosis not present

## 2021-06-01 DIAGNOSIS — M542 Cervicalgia: Secondary | ICD-10-CM | POA: Diagnosis not present

## 2021-06-01 DIAGNOSIS — M25562 Pain in left knee: Secondary | ICD-10-CM | POA: Diagnosis not present

## 2021-06-01 DIAGNOSIS — Z79891 Long term (current) use of opiate analgesic: Secondary | ICD-10-CM | POA: Diagnosis not present

## 2021-06-21 DIAGNOSIS — H2512 Age-related nuclear cataract, left eye: Secondary | ICD-10-CM | POA: Diagnosis not present

## 2021-06-21 DIAGNOSIS — H2511 Age-related nuclear cataract, right eye: Secondary | ICD-10-CM | POA: Diagnosis not present

## 2021-08-16 DIAGNOSIS — E1129 Type 2 diabetes mellitus with other diabetic kidney complication: Secondary | ICD-10-CM | POA: Diagnosis not present

## 2021-08-23 DIAGNOSIS — E785 Hyperlipidemia, unspecified: Secondary | ICD-10-CM | POA: Diagnosis not present

## 2021-08-23 DIAGNOSIS — I4821 Permanent atrial fibrillation: Secondary | ICD-10-CM | POA: Diagnosis not present

## 2021-08-23 DIAGNOSIS — R7309 Other abnormal glucose: Secondary | ICD-10-CM | POA: Diagnosis not present

## 2021-08-23 DIAGNOSIS — E1122 Type 2 diabetes mellitus with diabetic chronic kidney disease: Secondary | ICD-10-CM | POA: Diagnosis not present

## 2021-08-26 DIAGNOSIS — Z79891 Long term (current) use of opiate analgesic: Secondary | ICD-10-CM | POA: Diagnosis not present

## 2021-08-26 DIAGNOSIS — E6609 Other obesity due to excess calories: Secondary | ICD-10-CM | POA: Diagnosis not present

## 2021-08-26 DIAGNOSIS — M545 Low back pain, unspecified: Secondary | ICD-10-CM | POA: Diagnosis not present

## 2021-08-26 DIAGNOSIS — M25561 Pain in right knee: Secondary | ICD-10-CM | POA: Diagnosis not present

## 2021-08-27 ENCOUNTER — Other Ambulatory Visit: Payer: Self-pay | Admitting: Family Medicine

## 2021-08-27 ENCOUNTER — Other Ambulatory Visit (HOSPITAL_COMMUNITY): Payer: Self-pay | Admitting: Family Medicine

## 2021-08-27 DIAGNOSIS — M545 Low back pain, unspecified: Secondary | ICD-10-CM

## 2021-09-05 ENCOUNTER — Ambulatory Visit (HOSPITAL_COMMUNITY)
Admission: RE | Admit: 2021-09-05 | Discharge: 2021-09-05 | Disposition: A | Discharge status: Home / Self Care | Payer: Medicare Other | Source: Ambulatory Visit | Attending: Family Medicine | Admitting: Family Medicine

## 2021-09-05 DIAGNOSIS — K5730 Diverticulosis of large intestine without perforation or abscess without bleeding: Secondary | ICD-10-CM | POA: Diagnosis not present

## 2021-09-05 DIAGNOSIS — K5900 Constipation, unspecified: Secondary | ICD-10-CM | POA: Diagnosis not present

## 2021-09-05 DIAGNOSIS — M5136 Other intervertebral disc degeneration, lumbar region: Secondary | ICD-10-CM | POA: Diagnosis not present

## 2021-09-05 DIAGNOSIS — N200 Calculus of kidney: Secondary | ICD-10-CM | POA: Insufficient documentation

## 2021-09-05 DIAGNOSIS — N400 Enlarged prostate without lower urinary tract symptoms: Secondary | ICD-10-CM | POA: Diagnosis not present

## 2021-09-05 DIAGNOSIS — M545 Low back pain, unspecified: Secondary | ICD-10-CM | POA: Diagnosis not present

## 2021-09-05 DIAGNOSIS — I700 Atherosclerosis of aorta: Secondary | ICD-10-CM | POA: Diagnosis not present

## 2021-09-05 DIAGNOSIS — M419 Scoliosis, unspecified: Secondary | ICD-10-CM | POA: Diagnosis not present

## 2021-09-05 DIAGNOSIS — N323 Diverticulum of bladder: Secondary | ICD-10-CM | POA: Diagnosis not present

## 2021-09-05 DIAGNOSIS — M47819 Spondylosis without myelopathy or radiculopathy, site unspecified: Secondary | ICD-10-CM | POA: Insufficient documentation

## 2021-09-05 DIAGNOSIS — K4090 Unilateral inguinal hernia, without obstruction or gangrene, not specified as recurrent: Secondary | ICD-10-CM | POA: Insufficient documentation

## 2021-09-08 DIAGNOSIS — H524 Presbyopia: Secondary | ICD-10-CM | POA: Diagnosis not present

## 2021-10-31 DIAGNOSIS — G4733 Obstructive sleep apnea (adult) (pediatric): Secondary | ICD-10-CM | POA: Diagnosis not present

## 2021-10-31 DIAGNOSIS — I4821 Permanent atrial fibrillation: Secondary | ICD-10-CM | POA: Diagnosis not present

## 2021-10-31 DIAGNOSIS — E119 Type 2 diabetes mellitus without complications: Secondary | ICD-10-CM | POA: Diagnosis not present

## 2021-10-31 DIAGNOSIS — I10 Essential (primary) hypertension: Secondary | ICD-10-CM | POA: Diagnosis not present

## 2021-11-07 ENCOUNTER — Encounter (INDEPENDENT_AMBULATORY_CARE_PROVIDER_SITE_OTHER): Payer: Self-pay | Admitting: *Deleted

## 2021-11-07 DIAGNOSIS — I10 Essential (primary) hypertension: Secondary | ICD-10-CM | POA: Diagnosis not present

## 2021-11-07 DIAGNOSIS — D6869 Other thrombophilia: Secondary | ICD-10-CM | POA: Diagnosis not present

## 2021-11-07 DIAGNOSIS — I4821 Permanent atrial fibrillation: Secondary | ICD-10-CM | POA: Diagnosis not present

## 2021-11-07 DIAGNOSIS — E1122 Type 2 diabetes mellitus with diabetic chronic kidney disease: Secondary | ICD-10-CM | POA: Diagnosis not present

## 2021-12-06 ENCOUNTER — Encounter: Payer: Self-pay | Admitting: Student

## 2021-12-06 ENCOUNTER — Encounter: Payer: Self-pay | Admitting: *Deleted

## 2021-12-06 ENCOUNTER — Ambulatory Visit: Payer: Medicare Other | Attending: Student | Admitting: Student

## 2021-12-06 VITALS — BP 120/82 | HR 70 | Ht 70.0 in | Wt 246.4 lb

## 2021-12-06 DIAGNOSIS — I4821 Permanent atrial fibrillation: Secondary | ICD-10-CM

## 2021-12-06 DIAGNOSIS — I10 Essential (primary) hypertension: Secondary | ICD-10-CM | POA: Diagnosis not present

## 2021-12-06 DIAGNOSIS — Z9989 Dependence on other enabling machines and devices: Secondary | ICD-10-CM

## 2021-12-06 DIAGNOSIS — Z7901 Long term (current) use of anticoagulants: Secondary | ICD-10-CM

## 2021-12-06 DIAGNOSIS — R600 Localized edema: Secondary | ICD-10-CM

## 2021-12-06 DIAGNOSIS — G4733 Obstructive sleep apnea (adult) (pediatric): Secondary | ICD-10-CM | POA: Diagnosis not present

## 2021-12-06 NOTE — Progress Notes (Signed)
Cardiology Office Note    Date:  12/06/2021   ID:  Arthur Wilson, DOB May 02, 1945, MRN [id number]  PCP:  Asencion Noble, MD  Cardiologist: Rozann Lesches, MD    Chief Complaint  Patient presents with   Follow-up    6 month visit    History of Present Illness:    Arthur Wilson is a 76 y.o. male with past medical history of permanent atrial fibrillation (rate-control pursued given asymptomatic state, OSA and atrial dilation), HTN and OSA who presents to the office today for 11-monthfollow-up.  He was last examined by MErmalinda Barrios PA-C in 03/2021 and reported his heart rate had overall been well controlled when checked at home and was peaking into the 110s with activity. He was still working at [employer] also was exercising by riding his bike for 30 to 45 minutes several days a week. He was continued on his current cardiac medications including Bisoprolol 5 mg daily and Eliquis 5 mg twice daily.  In talking with the patient today, he reports overall feeling well from a cardiac perspective since his last office visit. He denies any recent chest pain or palpitations. Says his breathing has been stable with no specific orthopnea or PND. Uses his CPAP on a nightly basis. He does have chronic lower extremity edema and remains on low-dose HCTZ 6.25 mg daily and utilizes compression stockings on a daily basis. He does try to limit his sodium intake. He remains on Eliquis 5 mg twice daily for anticoagulation and reports he bleeds easily along his arms with the medication.   Past Medical History:  Diagnosis Date   A-fib ([facility]    Atrial fibrillation (HCC)    BPH (benign prostatic hyperplasia)    Cervical spondylosis without myelopathy    Essential hypertension    Impaired glucose tolerance    OSA on CPAP    Testicular hypofunction     Past Surgical History:  Procedure Laterality Date   FOOT SURGERY  01/10/2016   Bunion removal   KNEE SURGERY  1992   removal of ACL from  ski fall    Current Medications: Outpatient Medications Prior to Visit  Medication Sig Dispense Refill   ALPRAZolam (XANAX) 0.5 MG tablet Take 0.5 tablets by mouth at bedtime as needed.      bisoprolol-hydrochlorothiazide (ZIAC) 5-6.25 MG tablet Take 1 tablet by mouth daily.     ELIQUIS 5 MG TABS tablet Take 5 mg by mouth 2 (two) times daily.     fexofenadine (ALLEGRA) 180 MG tablet Take 180 mg by mouth daily.     fluticasone (FLONASE) 50 MCG/ACT nasal spray Place 1 spray into both nostrils as needed for allergies.     HYDROcodone-acetaminophen (NORCO/VICODIN) 5-325 MG tablet Take 1 tablet by mouth every 6 (six) hours as needed for moderate pain.     olmesartan (BENICAR) 20 MG tablet Take 20 mg by mouth daily.     OZEMPIC, 0.25 OR 0.5 MG/DOSE, 2 MG/1.5ML SOPN Inject 0.5 mg into the skin once a week.     pregabalin (LYRICA) 100 MG capsule Take 1 twice daily 180 capsule 1   rosuvastatin (CRESTOR) 10 MG tablet Take 10 mg by mouth daily.     No facility-administered medications prior to visit.     Allergies:   Patient has no known allergies.   Social History   Socioeconomic History   Marital status: Married    Spouse name: Arthur Wilson  Number of children: 3  Years of education: BS-Pharm   Highest education level: Not on file  Occupational History   Occupation: pharmacist     Comment: [employer]  Tobacco Use   Smoking status: Never   Smokeless tobacco: Never  Vaping Use   Vaping Use: Never used  Substance and Sexual Activity   Alcohol use: No   Drug use: No   Sexual activity: Not on file  Other Topics Concern   Not on file  Social History Narrative   Lives with wife   Patient is right handed.   Patient drinks caffeine a few times a week.   Social Determinants of Health   Financial Resource Strain: Not on file  Food Insecurity: Not on file  Transportation Needs: Not on file  Physical Activity: Not on file  Stress: Not on file  Social Connections: Not on file      Family History:  The patient's family history includes Breast cancer in his mother and sister; Heart attack in his father; Hypertension in his father and paternal grandfather; Prostate cancer in his paternal uncle.   Review of Systems:    Please see the history of present illness.     All other systems reviewed and are otherwise negative except as noted above.   Physical Exam:    VS:  BP 120/82   Pulse 70   Ht '5\' 10"'$  (1.778 m)   Wt 246 lb 6.4 oz (111.8 kg)   SpO2 96%   BMI 35.35 kg/m    General: Well developed, well nourished,male appearing in no acute distress. Head: Normocephalic, atraumatic. Neck: No carotid bruits. JVD not elevated.  Lungs: Respirations regular and unlabored, without wheezes or rales.  Heart: Irregularly irregular. No S3 or S4.  No murmur, no rubs, or gallops appreciated. Abdomen: Appears non-distended. No obvious abdominal masses. Msk:  Strength and tone appear normal for age. No obvious joint deformities or effusions. Extremities: No clubbing or cyanosis. Trace lower extremity edema.  Distal pedal pulses are 2+ bilaterally. Compression stockings in place.  Neuro: Alert and oriented X 3. Moves all extremities spontaneously. No focal deficits noted. Psych:  Responds to questions appropriately with a normal affect. Skin: No rashes or lesions noted  Wt Readings from Last 3 Encounters:  12/06/21 246 lb 6.4 oz (111.8 kg)  04/11/21 245 lb (111.1 kg)  01/27/21 248 lb (112.5 kg)     Studies/Labs Reviewed:   EKG:  EKG is ordered today. The EKG ordered today demonstrates rate-controlled atrial fibrillation, heart rate 70 with incomplete RBBB.  Recent Labs: No results found for requested labs within last 365 days.   Lipid Panel No results found for: "CHOL", "TRIG", "HDL", "CHOLHDL", "VLDL", "LDLCALC", "LDLDIRECT"  Additional studies/ records that were reviewed today include:   Echocardiogram: 10/2019 IMPRESSIONS     1. Left ventricular ejection  fraction, by estimation, is 60 to 65%. The  left ventricle has normal function. The left ventricle has no regional  wall motion abnormalities. There is mild left ventricular hypertrophy.  Left ventricular diastolic parameters  are indeterminate.   2. Right ventricular systolic function is normal. The right ventricular  size is normal.   3. Left atrial size was moderately dilated.   4. Right atrial size was moderately dilated.   5. The mitral valve is normal in structure. No evidence of mitral valve  regurgitation. No evidence of mitral stenosis.   6. The aortic valve is tricuspid. Aortic valve regurgitation is not  visualized. No aortic stenosis is present.  7. Mild pulmonary HTN, PASP is 36 mmHg.   8. The inferior vena cava is normal in size with greater than 50%  respiratory variability, suggesting right atrial pressure of 3 mmHg.   Assessment:    1. Permanent atrial fibrillation ([place])   2. Current use of long term anticoagulation   3. Essential hypertension   4. OSA on CPAP   5. Bilateral lower extremity edema      Plan:   In order of problems listed above:  1. Permanent Atrial Fibrillation/Use of Long-term Anticoagulation  - As mentioned above, a rate-control strategy has been pursued given his asymptomatic state, OSA and atrial dilation. Discussed this with the patient today and he still remains asymptomatic and rates have been well-controlled when checked at home. Continue Bisoprolol 5 mg daily for rate control. - He experiences easy bleeding along his arms with Eliquis but no specific melena or hematochezia. He did have recent labs with his PCP and we will request a copy of these.  2. HTN - His blood pressure is well-controlled at 120/82 during today's visit and this has been well controlled in the ambulatory setting. Continue current medical therapy with Bisoprolol-HCTZ 5-6.25 mg daily and Olmesartan 20 mg daily.  3. OSA - Continued compliance with CPAP encouraged.    4. Lower Extremity Edema - He does have chronic lower extremity edema which has overall been well controlled with the use of compression stockings along with HCTZ 6.25 mg daily. Encouraged him to elevate his lower extremities when able given varicose veins on examination and to continue to limit sodium intake. If edema worsens, can titrate HCTZ or switch to Lasix.    Medication Adjustments/Labs and Tests Ordered: Current medicines are reviewed at length with the patient today.  Concerns regarding medicines are outlined above.  Medication changes, Labs and Tests ordered today are listed in the Patient Instructions below. Patient Instructions  Medication Instructions:  Your physician recommends that you continue on your current medications as directed. Please refer to the Current Medication list given to you today.  *If you need a refill on your cardiac medications before your next appointment, please call your pharmacy*   Lab Work: NONE   If you have labs (blood work) drawn today and your tests are completely normal, you will receive your results only by: [place] (if you have MyChart) OR A paper copy in the mail If you have any lab test that is abnormal or we need to change your treatment, we will call you to review the results.   Testing/Procedures: NONE    Follow-Up: At [facility], you and your health needs are our priority.  As part of our continuing mission to provide you with exceptional heart care, we have created designated Provider Care Teams.  These Care Teams include your primary Cardiologist (physician) and Advanced Practice Providers (APPs -  Physician Assistants and Nurse Practitioners) who all work together to provide you with the care you need, when you need it.  We recommend signing up for the patient portal called "MyChart".  Sign up information is provided on this After Visit Summary.  MyChart is used to connect with patients for Virtual Visits  (Telemedicine).  Patients are able to view lab/test results, encounter notes, upcoming appointments, etc.  Non-urgent messages can be sent to your provider as well.   To learn more about what you can do with MyChart, go to [email or web address].    Your next appointment:   6 month(s)  The format  for your next appointment:   In Person  Provider:   You may see Rozann Lesches, MD or one of the following Advanced Practice Providers on your designated Care Team:   Bernerd Pho, PA-C  Ermalinda Barrios, PA-C     Other Instructions Thank you for choosing [facility]!    Important Information About Sugar         Signed, HANSEN CARINO, PA-C  12/06/2021 4:45 PM    [facility] [address] [place], [place] [address] Phone: [phone] Fax: [phone]

## 2021-12-06 NOTE — Patient Instructions (Signed)
Medication Instructions:  Your physician recommends that you continue on your current medications as directed. Please refer to the Current Medication list given to you today.  *If you need a refill on your cardiac medications before your next appointment, please call your pharmacy*   Lab Work: NONE   If you have labs (blood work) drawn today and your tests are completely normal, you will receive your results only by: MyChart Message (if you have MyChart) OR A paper copy in the mail If you have any lab test that is abnormal or we need to change your treatment, we will call you to review the results.   Testing/Procedures: NONE    Follow-Up: At [facility], you and your health needs are our priority.  As part of our continuing mission to provide you with exceptional heart care, we have created designated Provider Care Teams.  These Care Teams include your primary Cardiologist (physician) and Advanced Practice Providers (APPs -  Physician Assistants and Nurse Practitioners) who all work together to provide you with the care you need, when you need it.  We recommend signing up for the patient portal called "MyChart".  Sign up information is provided on this After Visit Summary.  MyChart is used to connect with patients for Virtual Visits (Telemedicine).  Patients are able to view lab/test results, encounter notes, upcoming appointments, etc.  Non-urgent messages can be sent to your provider as well.   To learn more about what you can do with MyChart, go to [email or web address].    Your next appointment:   6 month(s)  The format for your next appointment:   In Person  Provider:   You may see Samuel McDowell, MD or one of the following Advanced Practice Providers on your designated Care Team:   Brittany Strader, PA-C  Michele Lenze, PA-C     Other Instructions Thank you for choosing  [facility]!    Important Information About Sugar        

## 2021-12-08 DIAGNOSIS — R972 Elevated prostate specific antigen [PSA]: Secondary | ICD-10-CM | POA: Diagnosis not present

## 2021-12-08 DIAGNOSIS — N138 Other obstructive and reflux uropathy: Secondary | ICD-10-CM | POA: Diagnosis not present

## 2021-12-08 DIAGNOSIS — N401 Enlarged prostate with lower urinary tract symptoms: Secondary | ICD-10-CM | POA: Diagnosis not present

## 2021-12-08 DIAGNOSIS — Z8042 Family history of malignant neoplasm of prostate: Secondary | ICD-10-CM | POA: Diagnosis not present

## 2021-12-08 DIAGNOSIS — E291 Testicular hypofunction: Secondary | ICD-10-CM | POA: Diagnosis not present

## 2021-12-21 ENCOUNTER — Ambulatory Visit (HOSPITAL_COMMUNITY)
Admission: RE | Admit: 2021-12-21 | Discharge: 2021-12-21 | Disposition: A | Discharge status: Home / Self Care | Payer: Medicare Other | Source: Ambulatory Visit | Attending: Family Medicine | Admitting: Family Medicine

## 2021-12-21 ENCOUNTER — Other Ambulatory Visit (HOSPITAL_COMMUNITY): Payer: Self-pay | Admitting: Family Medicine

## 2021-12-21 DIAGNOSIS — M545 Low back pain, unspecified: Secondary | ICD-10-CM

## 2021-12-27 DIAGNOSIS — M545 Low back pain, unspecified: Secondary | ICD-10-CM | POA: Diagnosis not present

## 2021-12-27 DIAGNOSIS — Z79891 Long term (current) use of opiate analgesic: Secondary | ICD-10-CM | POA: Diagnosis not present

## 2021-12-27 DIAGNOSIS — E6609 Other obesity due to excess calories: Secondary | ICD-10-CM | POA: Diagnosis not present

## 2021-12-27 DIAGNOSIS — R7303 Prediabetes: Secondary | ICD-10-CM | POA: Diagnosis not present

## 2022-02-13 DIAGNOSIS — H2511 Age-related nuclear cataract, right eye: Secondary | ICD-10-CM | POA: Diagnosis not present

## 2022-02-13 DIAGNOSIS — H524 Presbyopia: Secondary | ICD-10-CM | POA: Diagnosis not present

## 2022-02-13 DIAGNOSIS — H5201 Hypermetropia, right eye: Secondary | ICD-10-CM | POA: Diagnosis not present

## 2022-02-13 DIAGNOSIS — H52221 Regular astigmatism, right eye: Secondary | ICD-10-CM | POA: Diagnosis not present

## 2022-03-03 DIAGNOSIS — E1129 Type 2 diabetes mellitus with other diabetic kidney complication: Secondary | ICD-10-CM | POA: Diagnosis not present

## 2022-03-10 DIAGNOSIS — I10 Essential (primary) hypertension: Secondary | ICD-10-CM | POA: Diagnosis not present

## 2022-03-10 DIAGNOSIS — E1122 Type 2 diabetes mellitus with diabetic chronic kidney disease: Secondary | ICD-10-CM | POA: Diagnosis not present

## 2022-03-10 DIAGNOSIS — I4821 Permanent atrial fibrillation: Secondary | ICD-10-CM | POA: Diagnosis not present

## 2022-03-10 DIAGNOSIS — R7309 Other abnormal glucose: Secondary | ICD-10-CM | POA: Diagnosis not present

## 2022-03-17 DIAGNOSIS — S8001XA Contusion of right knee, initial encounter: Secondary | ICD-10-CM | POA: Diagnosis not present

## 2022-03-17 DIAGNOSIS — L0390 Cellulitis, unspecified: Secondary | ICD-10-CM | POA: Diagnosis not present

## 2022-03-17 DIAGNOSIS — R609 Edema, unspecified: Secondary | ICD-10-CM | POA: Diagnosis not present

## 2022-03-17 DIAGNOSIS — M2391 Unspecified internal derangement of right knee: Secondary | ICD-10-CM | POA: Diagnosis not present

## 2022-03-17 DIAGNOSIS — R2243 Localized swelling, mass and lump, lower limb, bilateral: Secondary | ICD-10-CM | POA: Diagnosis not present

## 2022-03-21 DIAGNOSIS — M1711 Unilateral primary osteoarthritis, right knee: Secondary | ICD-10-CM | POA: Diagnosis not present

## 2022-03-22 ENCOUNTER — Ambulatory Visit
Admission: RE | Admit: 2022-03-22 | Discharge: 2022-03-22 | Disposition: A | Discharge status: Home / Self Care | Payer: Medicare Other | Source: Ambulatory Visit | Attending: Orthopedic Surgery | Admitting: Orthopedic Surgery

## 2022-03-22 ENCOUNTER — Encounter: Payer: Self-pay | Admitting: Orthopedic Surgery

## 2022-03-22 ENCOUNTER — Other Ambulatory Visit: Payer: Self-pay | Admitting: Orthopedic Surgery

## 2022-03-22 DIAGNOSIS — M7989 Other specified soft tissue disorders: Secondary | ICD-10-CM | POA: Diagnosis not present

## 2022-03-22 DIAGNOSIS — M25561 Pain in right knee: Secondary | ICD-10-CM | POA: Diagnosis not present

## 2022-03-22 DIAGNOSIS — M1711 Unilateral primary osteoarthritis, right knee: Secondary | ICD-10-CM

## 2022-03-22 DIAGNOSIS — R600 Localized edema: Secondary | ICD-10-CM | POA: Diagnosis not present

## 2022-03-23 DIAGNOSIS — R2243 Localized swelling, mass and lump, lower limb, bilateral: Secondary | ICD-10-CM | POA: Diagnosis not present

## 2022-03-23 DIAGNOSIS — S8001XA Contusion of right knee, initial encounter: Secondary | ICD-10-CM | POA: Diagnosis not present

## 2022-03-23 DIAGNOSIS — A4902 Methicillin resistant Staphylococcus aureus infection, unspecified site: Secondary | ICD-10-CM | POA: Diagnosis not present

## 2022-03-23 DIAGNOSIS — R600 Localized edema: Secondary | ICD-10-CM | POA: Diagnosis not present

## 2022-03-27 DIAGNOSIS — R531 Weakness: Secondary | ICD-10-CM | POA: Diagnosis not present

## 2022-03-27 DIAGNOSIS — W19XXXA Unspecified fall, initial encounter: Secondary | ICD-10-CM | POA: Diagnosis not present

## 2022-03-27 DIAGNOSIS — R5381 Other malaise: Secondary | ICD-10-CM | POA: Diagnosis not present

## 2022-03-28 ENCOUNTER — Encounter (HOSPITAL_COMMUNITY): Payer: Self-pay

## 2022-03-28 ENCOUNTER — Emergency Department (HOSPITAL_COMMUNITY): Payer: Medicare Other

## 2022-03-28 ENCOUNTER — Other Ambulatory Visit: Payer: Self-pay

## 2022-03-28 ENCOUNTER — Encounter (HOSPITAL_COMMUNITY): Payer: Self-pay | Admitting: *Deleted

## 2022-03-28 ENCOUNTER — Inpatient Hospital Stay (HOSPITAL_COMMUNITY)
Admission: EM | Admit: 2022-03-28 | Discharge: 2022-04-04 | DRG: 321 | Disposition: A | Discharge status: Home Health | Payer: Medicare Other | Attending: Internal Medicine | Admitting: Internal Medicine

## 2022-03-28 DIAGNOSIS — R600 Localized edema: Secondary | ICD-10-CM | POA: Diagnosis not present

## 2022-03-28 DIAGNOSIS — I214 Non-ST elevation (NSTEMI) myocardial infarction: Principal | ICD-10-CM | POA: Diagnosis present

## 2022-03-28 DIAGNOSIS — M25561 Pain in right knee: Secondary | ICD-10-CM | POA: Diagnosis not present

## 2022-03-28 DIAGNOSIS — I4891 Unspecified atrial fibrillation: Secondary | ICD-10-CM

## 2022-03-28 DIAGNOSIS — I252 Old myocardial infarction: Secondary | ICD-10-CM | POA: Diagnosis not present

## 2022-03-28 DIAGNOSIS — N400 Enlarged prostate without lower urinary tract symptoms: Secondary | ICD-10-CM | POA: Diagnosis present

## 2022-03-28 DIAGNOSIS — I959 Hypotension, unspecified: Secondary | ICD-10-CM | POA: Diagnosis not present

## 2022-03-28 DIAGNOSIS — N179 Acute kidney failure, unspecified: Secondary | ICD-10-CM | POA: Insufficient documentation

## 2022-03-28 DIAGNOSIS — R601 Generalized edema: Secondary | ICD-10-CM | POA: Diagnosis not present

## 2022-03-28 DIAGNOSIS — E8809 Other disorders of plasma-protein metabolism, not elsewhere classified: Secondary | ICD-10-CM | POA: Diagnosis present

## 2022-03-28 DIAGNOSIS — I4821 Permanent atrial fibrillation: Secondary | ICD-10-CM | POA: Diagnosis present

## 2022-03-28 DIAGNOSIS — G4733 Obstructive sleep apnea (adult) (pediatric): Secondary | ICD-10-CM | POA: Diagnosis present

## 2022-03-28 DIAGNOSIS — I9589 Other hypotension: Secondary | ICD-10-CM | POA: Diagnosis not present

## 2022-03-28 DIAGNOSIS — I272 Pulmonary hypertension, unspecified: Secondary | ICD-10-CM | POA: Diagnosis not present

## 2022-03-28 DIAGNOSIS — I5031 Acute diastolic (congestive) heart failure: Secondary | ICD-10-CM | POA: Insufficient documentation

## 2022-03-28 DIAGNOSIS — Z79899 Other long term (current) drug therapy: Secondary | ICD-10-CM | POA: Diagnosis not present

## 2022-03-28 DIAGNOSIS — E876 Hypokalemia: Secondary | ICD-10-CM | POA: Diagnosis not present

## 2022-03-28 DIAGNOSIS — I5033 Acute on chronic diastolic (congestive) heart failure: Secondary | ICD-10-CM | POA: Diagnosis present

## 2022-03-28 DIAGNOSIS — D696 Thrombocytopenia, unspecified: Secondary | ICD-10-CM | POA: Diagnosis not present

## 2022-03-28 DIAGNOSIS — I083 Combined rheumatic disorders of mitral, aortic and tricuspid valves: Secondary | ICD-10-CM | POA: Diagnosis not present

## 2022-03-28 DIAGNOSIS — Z7984 Long term (current) use of oral hypoglycemic drugs: Secondary | ICD-10-CM

## 2022-03-28 DIAGNOSIS — Z8249 Family history of ischemic heart disease and other diseases of the circulatory system: Secondary | ICD-10-CM | POA: Diagnosis not present

## 2022-03-28 DIAGNOSIS — N170 Acute kidney failure with tubular necrosis: Secondary | ICD-10-CM | POA: Diagnosis present

## 2022-03-28 DIAGNOSIS — E1165 Type 2 diabetes mellitus with hyperglycemia: Secondary | ICD-10-CM | POA: Diagnosis not present

## 2022-03-28 DIAGNOSIS — I110 Hypertensive heart disease with heart failure: Secondary | ICD-10-CM | POA: Diagnosis present

## 2022-03-28 DIAGNOSIS — I429 Cardiomyopathy, unspecified: Secondary | ICD-10-CM | POA: Diagnosis present

## 2022-03-28 DIAGNOSIS — R7989 Other specified abnormal findings of blood chemistry: Secondary | ICD-10-CM | POA: Diagnosis not present

## 2022-03-28 DIAGNOSIS — Z803 Family history of malignant neoplasm of breast: Secondary | ICD-10-CM

## 2022-03-28 DIAGNOSIS — Z8042 Family history of malignant neoplasm of prostate: Secondary | ICD-10-CM

## 2022-03-28 DIAGNOSIS — Z7901 Long term (current) use of anticoagulants: Secondary | ICD-10-CM

## 2022-03-28 DIAGNOSIS — I25110 Atherosclerotic heart disease of native coronary artery with unstable angina pectoris: Secondary | ICD-10-CM | POA: Diagnosis not present

## 2022-03-28 DIAGNOSIS — I517 Cardiomegaly: Secondary | ICD-10-CM | POA: Diagnosis not present

## 2022-03-28 DIAGNOSIS — E861 Hypovolemia: Secondary | ICD-10-CM

## 2022-03-28 DIAGNOSIS — E872 Acidosis, unspecified: Secondary | ICD-10-CM | POA: Diagnosis not present

## 2022-03-28 DIAGNOSIS — Z955 Presence of coronary angioplasty implant and graft: Secondary | ICD-10-CM | POA: Diagnosis not present

## 2022-03-28 DIAGNOSIS — D649 Anemia, unspecified: Secondary | ICD-10-CM | POA: Diagnosis present

## 2022-03-28 DIAGNOSIS — I4819 Other persistent atrial fibrillation: Secondary | ICD-10-CM | POA: Diagnosis not present

## 2022-03-28 DIAGNOSIS — I2510 Atherosclerotic heart disease of native coronary artery without angina pectoris: Secondary | ICD-10-CM | POA: Diagnosis not present

## 2022-03-28 LAB — BASIC METABOLIC PANEL
Anion gap: 11 (ref 5–15)
BUN: 90 mg/dL — ABNORMAL HIGH (ref 8–23)
CO2: 31 mmol/L (ref 22–32)
Calcium: 8.3 mg/dL — ABNORMAL LOW (ref 8.9–10.3)
Chloride: 97 mmol/L — ABNORMAL LOW (ref 98–111)
Creatinine, Ser: 2.71 mg/dL — ABNORMAL HIGH (ref 0.61–1.24)
GFR, Estimated: 24 mL/min — ABNORMAL LOW (ref >60)
Glucose, Bld: 155 mg/dL — ABNORMAL HIGH (ref 70–99)
Potassium: 3.6 mmol/L (ref 3.5–5.1)
Sodium: 139 mmol/L (ref 135–145)

## 2022-03-28 LAB — URINALYSIS, ROUTINE W REFLEX MICROSCOPIC
Bilirubin Urine: NEGATIVE
Glucose, UA: NEGATIVE mg/dL
Ketones, ur: NEGATIVE mg/dL
Leukocytes,Ua: NEGATIVE
Nitrite: NEGATIVE
Protein, ur: NEGATIVE mg/dL
Specific Gravity, Urine: 1.012 (ref 1.005–1.030)
pH: 5 (ref 5.0–8.0)

## 2022-03-28 LAB — BRAIN NATRIURETIC PEPTIDE: B Natriuretic Peptide: 531 pg/mL — ABNORMAL HIGH (ref 0.0–100.0)

## 2022-03-28 LAB — CBC
HCT: 40.1 % (ref 39.0–52.0)
Hemoglobin: 13.3 g/dL (ref 13.0–17.0)
MCH: 30.6 pg (ref 26.0–34.0)
MCHC: 33.2 g/dL (ref 30.0–36.0)
MCV: 92.4 fL (ref 80.0–100.0)
Platelets: 118 10*3/uL — ABNORMAL LOW (ref 150–400)
RBC: 4.34 MIL/uL (ref 4.22–5.81)
RDW: 15.5 % (ref 11.5–15.5)
WBC: 11.5 10*3/uL — ABNORMAL HIGH (ref 4.0–10.5)
nRBC: 0 % (ref 0.0–0.2)

## 2022-03-28 LAB — CBG MONITORING, ED: Glucose-Capillary: 166 mg/dL — ABNORMAL HIGH (ref 70–99)

## 2022-03-28 LAB — LACTIC ACID, PLASMA
Lactic Acid, Venous: 3.1 mmol/L (ref 0.5–1.9)
Lactic Acid, Venous: 2.1 mmol/L (ref 0.5–1.9)

## 2022-03-28 LAB — TROPONIN I (HIGH SENSITIVITY)
Troponin I (High Sensitivity): 3628 ng/L (ref <18)
Troponin I (High Sensitivity): 3084 ng/L (ref <18)

## 2022-03-28 LAB — PROTIME-INR
INR: 1.4 — ABNORMAL HIGH (ref 0.8–1.2)
Prothrombin Time: 16.7 seconds — ABNORMAL HIGH (ref 11.4–15.2)

## 2022-03-28 MED ORDER — SODIUM CHLORIDE 0.9 % IV BOLUS
500.0000 mL | Freq: Once | INTRAVENOUS | Status: AC
  Administered 2022-03-28: 500 mL via INTRAVENOUS

## 2022-03-28 MED ORDER — HEPARIN (PORCINE) 25000 UT/250ML-% IV SOLN
1650.0000 [IU]/h | INTRAVENOUS | Status: DC
  Administered 2022-03-28 – 2022-03-29 (×2): 1450 [IU]/h via INTRAVENOUS
  Administered 2022-03-30: 1650 [IU]/h via INTRAVENOUS
  Filled 2022-03-28 (×3): qty 250

## 2022-03-28 NOTE — ED Notes (Signed)
CBG at 1910 was '166mg'$ /dl

## 2022-03-28 NOTE — Progress Notes (Signed)
ANTICOAGULATION CONSULT NOTE - Initial Consult  Pharmacy Consult for Heparin Indication: chest pain/ACS and atrial fibrillation  No Known Allergies  Patient Measurements:   Heparin Dosing Weight: 100 kg  Vital Signs: Temp: 97.5 F (36.4 C) (01/02 2159) Temp Source: Oral (01/02 2159) BP: 104/75 (01/02 2230) Pulse Rate: 49 (01/02 2230)  Labs: Recent Labs    03/28/22 1816 03/28/22 2029  HGB 13.3  --   HCT 40.1  --   PLT 118*  --   LABPROT 16.7*  --   INR 1.4*  --   CREATININE 2.71*  --   TROPONINIHS 3,628* 3,084*    CrCl cannot be calculated (Unknown ideal weight.).   Medical History: Past Medical History:  Diagnosis Date   A-fib [facility])    Atrial fibrillation (HCC)    BPH (benign prostatic hyperplasia)    Cervical spondylosis without myelopathy    Essential hypertension    Impaired glucose tolerance    OSA on CPAP    Testicular hypofunction     Medications:  No current facility-administered medications on file prior to encounter.   Current Outpatient Medications on File Prior to Encounter  Medication Sig Dispense Refill   acetaminophen (TYLENOL) 500 MG tablet Take 1,000 mg by mouth every 6 (six) hours as needed for moderate pain.     ALPRAZolam (XANAX) 0.5 MG tablet Take 0.5 tablets by mouth at bedtime as needed for anxiety or sleep.     bisoprolol-hydrochlorothiazide (ZIAC) 5-6.25 MG tablet Take 1 tablet by mouth daily.     ELIQUIS 5 MG TABS tablet Take 5 mg by mouth 2 (two) times daily.     fexofenadine (ALLEGRA) 180 MG tablet Take 180 mg by mouth daily.     fluticasone (FLONASE) 50 MCG/ACT nasal spray Place 1 spray into both nostrils as needed for allergies.     furosemide (LASIX) 40 MG tablet Take 40 mg by mouth daily.     HYDROcodone-acetaminophen (NORCO) 7.5-325 MG tablet Take 0.5-1 tablets by mouth 2 (two) times daily as needed for moderate pain.     metFORMIN (GLUCOPHAGE) 500 MG tablet Take 500 mg by mouth See admin instructions. Take 1 daily for 14  days, then take 1 tablet twice daily. Start 03/14/22     olmesartan (BENICAR) 20 MG tablet Take 20 mg by mouth daily.     pregabalin (LYRICA) 100 MG capsule Take 1 twice daily 180 capsule 1   rosuvastatin (CRESTOR) 10 MG tablet Take 10 mg by mouth daily.     [DISCONTINUED] BESIVANCE 0.6 % SUSP  (Patient not taking: Reported on 03/28/2022)     [DISCONTINUED] Bromfenac Sodium (PROLENSA) 0.07 % SOLN  (Patient not taking: Reported on 03/28/2022)     [DISCONTINUED] Difluprednate 0.05 % EMUL  (Patient not taking: Reported on 03/28/2022)     [DISCONTINUED] gatifloxacin (ZYMAXID) 0.5 % SOLN  (Patient not taking: Reported on 03/28/2022)     [DISCONTINUED] HYDROcodone-acetaminophen (NORCO/VICODIN) 5-325 MG tablet Take 1 tablet by mouth every 6 (six) hours as needed for moderate pain. (Patient not taking: Reported on 03/28/2022)     [DISCONTINUED] molnupiravir EUA (LAGEVRIO) 200 MG CAPS capsule Take 4 capsules by mouth 2 (two) times daily. (Patient not taking: Reported on 03/28/2022)     [DISCONTINUED] OZEMPIC, 0.25 OR 0.5 MG/DOSE, 2 MG/1.5ML SOPN Inject 0.5 mg into the skin once a week. (Patient not taking: Reported on 03/28/2022)     [DISCONTINUED] Varenicline Tartrate (TYRVAYA) 0.03 MG/ACT SOLN  (Patient not taking: Reported on 03/28/2022)  Assessment: 77 y.o. male with elevated troponin/ACS, h/o Afib and Eliquis on hold, for heparin  Goal of Therapy:  aPTT 66-102 sec Heparin level 0.3-0.7 units/ml Monitor platelets by anticoagulation protocol: Yes   Plan:  Start heparin 1450 units/hr Check aPTT in 8 hours   Caryl Pina 03/28/2022,11:03 PM

## 2022-03-28 NOTE — ED Notes (Signed)
Dr. Melina Copa notified of BP 83/64, HR 120. Verbal order for 500 mL NS bolus.

## 2022-03-28 NOTE — ED Notes (Signed)
Pt transported back to room from Xray.

## 2022-03-28 NOTE — ED Provider Notes (Signed)
[facility] EMERGENCY DEPARTMENT Provider Note   CSN: [id number] Arrival date & time: 03/28/22  1646     History {Add pertinent medical, surgical, social history, OB history to HPI:1} Chief Complaint  Patient presents with   Hypotension    Arthur Wilson is a 77 y.o. male.  He has a history of A-fib and is on anticoagulation.  He said he banged his knee at the beginning of December and his right knee and lower leg have been giving him trouble since then.  He was significantly bruised.  He saw his orthopedic doctor and had a CAT scan of his leg and x-rays that did not show any acute fracture.  He was having some swelling bilateral LE so his PCP put him on diuretics.  Saw PCP today and was noted to be hypotensive and was sent to the ED for further evaluation.  He denies any chest pain or shortness of breath abdominal pain vomiting diarrhea or urinary symptoms.  His legs remain swollen although are improved.  He said his baseline blood pressures usually 115/75.  He has been on 40 of Lasix for the last ?few days.  The history is provided by the patient and the spouse.  Weakness Severity:  Moderate Onset quality:  Gradual Duration:  1 week Timing:  Constant Progression:  Unchanged Chronicity:  New Relieved by:  Nothing Worsened by:  Activity Ineffective treatments:  Rest Associated symptoms: frequency   Associated symptoms: no abdominal pain, no chest pain, no cough, no diarrhea, no dysuria, no fever, no nausea, no shortness of breath, no vision change and no vomiting        Home Medications Prior to Admission medications   Medication Sig Start Date End Date Taking? Authorizing Provider  ALPRAZolam Duanne Moron) 0.5 MG tablet Take 0.5 tablets by mouth at bedtime as needed.  07/05/18   [provider]  bisoprolol-hydrochlorothiazide (ZIAC) 5-6.25 MG tablet Take 1 tablet by mouth daily. 02/17/19   [provider]  ELIQUIS 5 MG TABS tablet Take 5 mg by mouth 2 (two) times  daily. 12/31/19   [provider]  fexofenadine (ALLEGRA) 180 MG tablet Take 180 mg by mouth daily.    [provider]  fluticasone (FLONASE) 50 MCG/ACT nasal spray Place 1 spray into both nostrils as needed for allergies. 01/12/20   [provider]  HYDROcodone-acetaminophen (NORCO/VICODIN) 5-325 MG tablet Take 1 tablet by mouth every 6 (six) hours as needed for moderate pain.    [provider]  olmesartan (BENICAR) 20 MG tablet Take 20 mg by mouth daily. 02/23/21   [provider]  OZEMPIC, 0.25 OR 0.5 MG/DOSE, 2 MG/1.5ML SOPN Inject 0.5 mg into the skin once a week. 03/16/21   [provider]  pregabalin (LYRICA) 100 MG capsule Take 1 twice daily 05/12/21   Suzzanne Cloud, NP  rosuvastatin (CRESTOR) 10 MG tablet Take 10 mg by mouth daily.    [provider]      Allergies    Patient has no known allergies.    Review of Systems   Review of Systems  Constitutional:  Negative for fever.  HENT:  Negative for sore throat.   Respiratory:  Negative for cough and shortness of breath.   Cardiovascular:  Positive for leg swelling. Negative for chest pain.  Gastrointestinal:  Negative for abdominal pain, diarrhea, nausea and vomiting.  Genitourinary:  Positive for frequency. Negative for dysuria.  Skin:  Positive for wound. Negative for rash.  Neurological:  Positive  for weakness.    Physical Exam Updated Vital Signs BP (!) 83/64   Pulse 93   Temp (!) 97.5 F (36.4 C) (Oral)   Resp 11   SpO2 96%  Physical Exam Vitals and nursing note reviewed.  Constitutional:      General: He is not in acute distress.    Appearance: Normal appearance. He is well-developed.  HENT:     Head: Normocephalic and atraumatic.  Eyes:     Conjunctiva/sclera: Conjunctivae normal.  Cardiovascular:     Rate and Rhythm: Tachycardia present. Rhythm irregular.     Heart sounds: No murmur heard. Pulmonary:     Effort: Pulmonary effort is normal. No  respiratory distress.     Breath sounds: Normal breath sounds.  Abdominal:     Palpations: Abdomen is soft.     Tenderness: There is no abdominal tenderness. There is no guarding or rebound.  Musculoskeletal:     Cervical back: Neck supple.     Right lower leg: Edema present.     Left lower leg: Edema present.  Skin:    General: Skin is warm and dry.     Capillary Refill: Capillary refill takes less than 2 seconds.  Neurological:     General: No focal deficit present.     Mental Status: He is alert.     ED Results / Procedures / Treatments   Labs (all labs ordered are listed, but only abnormal results are displayed) Labs Reviewed  CULTURE, BLOOD (ROUTINE X 2)  CULTURE, BLOOD (ROUTINE X 2)  BASIC METABOLIC PANEL  CBC  URINALYSIS, ROUTINE W REFLEX MICROSCOPIC  LACTIC ACID, PLASMA  LACTIC ACID, PLASMA  PROTIME-INR  BRAIN NATRIURETIC PEPTIDE  CBG MONITORING, ED  TROPONIN I (HIGH SENSITIVITY)    EKG EKG Interpretation  Date/Time:  Tuesday March 28 2022 17:58:36 EST Ventricular Rate:  113 PR Interval:    QRS Duration: 98 QT Interval:  345 QTC Calculation: 473 R Axis:   25 Text Interpretation: Atrial fibrillation Consider anterior infarct No old tracing to compare Confirmed by Aletta Edouard [phone]) on 03/28/2022 6:23:54 PM  Radiology No results found.  Procedures Procedures  {Document cardiac monitor, telemetry assessment procedure when appropriate:1}  Medications Ordered in ED Medications  sodium chloride 0.9 % bolus 500 mL (500 mLs Intravenous New Bag/Given 03/28/22 1815)    ED Course/ Medical Decision Making/ A&P                           Medical Decision Making Amount and/or Complexity of Data Reviewed Labs: ordered. Radiology: ordered.   This patient complains of ***; this involves an extensive number of treatment Options and is a complaint that carries with it a high risk of complications and morbidity. The differential includes ***  I ordered,  reviewed and interpreted labs, which included *** I ordered medication *** and reviewed PMP when indicated. I ordered imaging studies which included *** and I independently    visualized and interpreted imaging which showed *** Additional history obtained from *** Previous records obtained and reviewed *** I consulted *** and discussed lab and imaging findings and discussed disposition.  Cardiac monitoring reviewed, *** Social determinants considered, *** Critical Interventions: ***  After the interventions stated above, I reevaluated the patient and found *** Admission and further testing considered, ***   {Document critical care time when appropriate:1} {Document review of labs and clinical decision tools ie heart score, Chads2Vasc2 etc:1}  {Document your independent review  of radiology images, and any outside records:1} {Document your discussion with family members, caretakers, and with consultants:1} {Document social determinants of health affecting pt's care:1} {Document your decision making why or why not admission, treatments were needed:1} Final Clinical Impression(s) / ED Diagnoses Final diagnoses:  None    Rx / DC Orders ED Discharge Orders     None

## 2022-03-28 NOTE — H&P (Signed)
History and Physical    Patient: Arthur Wilson DOB: 1946/01/23 DOA: 03/28/2022 DOS: the patient was seen and examined on 03/29/2022 PCP: [name], MD  Patient coming from: Home  Chief Complaint:  Chief Complaint  Patient presents with   Hypotension   HPI: Arthur Wilson is a 77 y.o. male with medical history significant of A-fib on Eliquis who presents to the emergency department due to low blood pressure.  Patient states that he banged his right knee and leg around Christmas time after which he started to have problems with the leg since it appeared bruised.  He went to his orthopedic physician, CAT scan and x-ray of the leg was done without any acute fracture.  He started to have bilateral swelling of lower extremity, so his PCP placed him on diuretic.  He went to his PCP today and BP was noted to be in hypotensive range, so he was sent to the ED for further evaluation.  Patient denies chest pain, shortness of breath, fever, chills, nausea, vomiting or abdominal pain.  Patient states that his blood pressure is usually around 115/75.  He has been on Lasix 40 mg since last few days.  ED Course:  In the emergency department, temperature was 97.5, HR was 110 bpm, BP was 83/64, respiratory rate 11/min and O2 sat was 96% on room air.  Workup in the ED showed normal CBC except for WBC of 11.5 and platelets of 118, BMP was normal except for chloride of 97, BUN/creatinine 90/2.71 (no prior labs for comparison) and blood glucose of 155.  BNP 531, troponin 3, 628 > 3,084, lactic acid 3.1 > 2.1.  Urinalysis was normal.  Blood culture pending Chest x-ray showed cardiomegaly with no additional acute cardiopulmonary process IV hydration was provided, cardiologist on-call at [facility] was consulted recommended starting patient on heparin drip and for patient to be admitted to [facility] for follow-up once patient arrives at [facility]   Review of Systems: Review of systems as noted in the HPI. All  other systems reviewed and are negative.   Past Medical History:  Diagnosis Date   A-fib [facility])    Atrial fibrillation (HCC)    BPH (benign prostatic hyperplasia)    Cervical spondylosis without myelopathy    Essential hypertension    Impaired glucose tolerance    OSA on CPAP    Testicular hypofunction    Past Surgical History:  Procedure Laterality Date   FOOT SURGERY  01/10/2016   Bunion removal   KNEE SURGERY  1992   removal of ACL from ski fall    Social History:  reports that he has never smoked. He has never used smokeless tobacco. He reports that he does not drink alcohol and does not use drugs.   No Known Allergies  Family History  Problem Relation Age of Onset   Breast cancer Mother    Hypertension Father    Heart attack Father    Breast cancer Sister    Hypertension Paternal Grandfather    Prostate cancer Paternal Uncle      Prior to Admission medications   Medication Sig Start Date End Date Taking? Authorizing Provider  acetaminophen (TYLENOL) 500 MG tablet Take 1,000 mg by mouth every 6 (six) hours as needed for moderate pain.   Yes [provider]  ALPRAZolam Duanne Moron) 0.5 MG tablet Take 0.5 tablets by mouth at bedtime as needed for anxiety or sleep. 07/05/18  Yes [provider]  bisoprolol-hydrochlorothiazide (ZIAC) 5-6.25 MG tablet  Take 1 tablet by mouth daily. 02/17/19  Yes [provider]  ELIQUIS 5 MG TABS tablet Take 5 mg by mouth 2 (two) times daily. 12/31/19  Yes [provider]  fexofenadine (ALLEGRA) 180 MG tablet Take 180 mg by mouth daily.   Yes [provider]  fluticasone (FLONASE) 50 MCG/ACT nasal spray Place 1 spray into both nostrils as needed for allergies. 01/12/20  Yes [provider]  furosemide (LASIX) 40 MG tablet Take 40 mg by mouth daily. 03/23/22  Yes [provider]  HYDROcodone-acetaminophen (NORCO) 7.5-325 MG tablet Take 0.5-1 tablets by mouth 2 (two) times daily as  needed for moderate pain.   Yes [provider]  metFORMIN (GLUCOPHAGE) 500 MG tablet Take 500 mg by mouth See admin instructions. Take 1 daily for 14 days, then take 1 tablet twice daily. Start 03/14/22   Yes [provider]  olmesartan (BENICAR) 20 MG tablet Take 20 mg by mouth daily. 02/23/21  Yes [provider]  pregabalin (LYRICA) 100 MG capsule Take 1 twice daily 05/12/21  Yes Suzzanne Cloud, NP  rosuvastatin (CRESTOR) 10 MG tablet Take 10 mg by mouth daily.   Yes [provider]    Physical Exam: BP 113/64   Pulse (!) 30   Temp 97.7 F (36.5 C) (Oral)   Resp 16   SpO2 96%   General: 77 y.o. year-old male well developed well nourished in no acute distress.  Alert and oriented x3. HEENT: NCAT, EOMI Neck: Supple, trachea medial Cardiovascular: Tachycardia, irregular rate and rhythm with no rubs or gallops.  No thyromegaly or JVD noted.  +2 B/L lower extremity edema. 2/4 pulses in all 4 extremities. Respiratory: Clear to auscultation with no wheezes or rales. Good inspiratory effort. Abdomen: Soft, nontender nondistended with normal bowel sounds x4 quadrants. Muskuloskeletal: No cyanosis, clubbing or edema noted bilaterally Neuro: CN II-XII intact, strength 5/5 x 4, sensation, reflexes intact Skin: No ulcerative lesions noted or rashes Psychiatry: Judgement and insight appear normal. Mood is appropriate for condition and setting          Labs on Admission:  Basic Metabolic Panel: Recent Labs  Lab 03/28/22 1816  NA 139  K 3.6  CL 97*  CO2 31  GLUCOSE 155*  BUN 90*  CREATININE 2.71*  CALCIUM 8.3*   Liver Function Tests: No results for input(s): "AST", "ALT", "ALKPHOS", "BILITOT", "PROT", "ALBUMIN" in the last 168 hours. No results for input(s): "LIPASE", "AMYLASE" in the last 168 hours. No results for input(s): "AMMONIA" in the last 168 hours. CBC: Recent Labs  Lab 03/28/22 1816  WBC 11.5*  HGB 13.3  HCT 40.1  MCV 92.4  PLT  118*   Cardiac Enzymes: No results for input(s): "CKTOTAL", "CKMB", "CKMBINDEX", "TROPONINI" in the last 168 hours.  BNP (last 3 results) Recent Labs    03/28/22 1816  BNP 531.0*    ProBNP (last 3 results) No results for input(s): "PROBNP" in the last 8760 hours.  CBG: Recent Labs  Lab 03/28/22 1907  GLUCAP 166*    Radiological Exams on Admission: DG Chest 2 View  Result Date: 03/28/2022 CLINICAL DATA:  Sepsis suspected EXAM: CHEST - 2 VIEW COMPARISON:  01/14/2020 FINDINGS: Mild cardiomegaly. No focal pulmonary opacity. No pleural effusion or pneumothorax. No acute osseous abnormality. IMPRESSION: Cardiomegaly with no additional acute cardiopulmonary process. Electronically Signed   By: Merilyn Baba M.D.   On: 03/28/2022 18:51    EKG: I independently viewed the EKG done and my findings  are as followed: A-fib with RVR  Assessment/Plan Present on Admission:  NSTEMI (non-ST elevated myocardial infarction) ([place])  Principal Problem:   NSTEMI (non-ST elevated myocardial infarction) ([place]) Active Problems:   Elevated brain natriuretic peptide (BNP) level   Lactic acidosis   AKI (acute kidney injury) ([place])   Type 2 diabetes mellitus with hyperglycemia (HCC)  NSTEMI Troponin 3, 628 > 3,084 EKG shows A-fib with RVR Patient denies any chest pain Cardiology was consulted, heparin drip was started and it was recommended for patient to be admitted to [facility] and cardiology will follow-up on consult on arrival  Elevated BNP BNP 531 Continue total input/output, daily weights and fluid restriction Continue Cardiac diet  Echocardiogram done on 11/05/2019 showed LVEF of 60 to 65%.  No RWMA.  Mild LVH.  LV diastolic parameters are indeterminate.  Echocardiogram will be done in the morning   Lactic acidosis Lactic acid 3.1 > 2.1, IV hydration was provided Continue to trend lactic acid  Hypotension-resolved  Acute kidney injury BUN/creatinine 90/2.71 (no prior labs for  comparison) IV hydration was provided, consider further hydration based on echocardiogram findings Renally adjust medications, avoid nephrotoxic agents/dehydration/hypotension  Thrombocytopenia possibly reactive Platelets 118, continue to monitor platelet levels  Type 2 diabetes mellitus with hyperglycemia  CBG 155, continue ISS and hypoglycemia protocol  Permanent atrial fibrillation Continue heparin drip Beta-blocker will be temporarily held at this time due to soft BP  DVT prophylaxis: Heparin drip  Code Status: Full code  Family Communication: None at bedside  Consults: Cardiology by EDP  Severity of Illness: The appropriate patient status for this patient is INPATIENT. Inpatient status is judged to be reasonable and necessary in order to provide the required intensity of service to ensure the patient's safety. The patient's presenting symptoms, physical exam findings, and initial radiographic and laboratory data in the context of their chronic comorbidities is felt to place them at high risk for further clinical deterioration. Furthermore, it is not anticipated that the patient will be medically stable for discharge from the hospital within 2 midnights of admission.   * I certify that at the point of admission it is my clinical judgment that the patient will require inpatient hospital care spanning beyond 2 midnights from the point of admission due to high intensity of service, high risk for further deterioration and high frequency of surveillance required.*  Author: Bernadette Hoit, DO 03/29/2022 6:03 AM  For on call review [email or web address].

## 2022-03-28 NOTE — ED Triage Notes (Signed)
Pt was seen by PCP and found to be hypotensive and has been having bilateral lower extremity edema.  Pt has generalized weakness and per wife he has been having some generalized confusion and changes in voice x 2 days

## 2022-03-28 NOTE — ED Notes (Signed)
Lactic Acid 3.1 MD notified

## 2022-03-29 ENCOUNTER — Encounter (HOSPITAL_COMMUNITY): Payer: Self-pay | Admitting: Internal Medicine

## 2022-03-29 ENCOUNTER — Inpatient Hospital Stay (HOSPITAL_COMMUNITY): Payer: Medicare Other

## 2022-03-29 ENCOUNTER — Other Ambulatory Visit (HOSPITAL_COMMUNITY): Payer: Self-pay | Admitting: *Deleted

## 2022-03-29 DIAGNOSIS — I9589 Other hypotension: Secondary | ICD-10-CM

## 2022-03-29 DIAGNOSIS — N179 Acute kidney failure, unspecified: Secondary | ICD-10-CM | POA: Insufficient documentation

## 2022-03-29 DIAGNOSIS — E1165 Type 2 diabetes mellitus with hyperglycemia: Secondary | ICD-10-CM | POA: Insufficient documentation

## 2022-03-29 DIAGNOSIS — I4821 Permanent atrial fibrillation: Secondary | ICD-10-CM

## 2022-03-29 DIAGNOSIS — I5031 Acute diastolic (congestive) heart failure: Secondary | ICD-10-CM

## 2022-03-29 DIAGNOSIS — E861 Hypovolemia: Secondary | ICD-10-CM

## 2022-03-29 DIAGNOSIS — I214 Non-ST elevation (NSTEMI) myocardial infarction: Secondary | ICD-10-CM | POA: Diagnosis not present

## 2022-03-29 DIAGNOSIS — R7989 Other specified abnormal findings of blood chemistry: Secondary | ICD-10-CM | POA: Insufficient documentation

## 2022-03-29 DIAGNOSIS — E872 Acidosis, unspecified: Secondary | ICD-10-CM | POA: Insufficient documentation

## 2022-03-29 DIAGNOSIS — I4891 Unspecified atrial fibrillation: Secondary | ICD-10-CM

## 2022-03-29 LAB — BASIC METABOLIC PANEL
Anion gap: 10 (ref 5–15)
BUN: 74 mg/dL — ABNORMAL HIGH (ref 8–23)
CO2: 32 mmol/L (ref 22–32)
Calcium: 8.1 mg/dL — ABNORMAL LOW (ref 8.9–10.3)
Chloride: 97 mmol/L — ABNORMAL LOW (ref 98–111)
Creatinine, Ser: 1.68 mg/dL — ABNORMAL HIGH (ref 0.61–1.24)
GFR, Estimated: 42 mL/min — ABNORMAL LOW (ref >60)
Glucose, Bld: 158 mg/dL — ABNORMAL HIGH (ref 70–99)
Potassium: 3.3 mmol/L — ABNORMAL LOW (ref 3.5–5.1)
Sodium: 139 mmol/L (ref 135–145)

## 2022-03-29 LAB — LACTIC ACID, PLASMA
Lactic Acid, Venous: 1.1 mmol/L (ref 0.5–1.9)
Lactic Acid, Venous: 1.5 mmol/L (ref 0.5–1.9)

## 2022-03-29 LAB — GLUCOSE, CAPILLARY: Glucose-Capillary: 224 mg/dL — ABNORMAL HIGH (ref 70–99)

## 2022-03-29 LAB — ECHOCARDIOGRAM COMPLETE
Area-P 1/2: 4.39 cm2
S' Lateral: 2.9 cm

## 2022-03-29 LAB — APTT
aPTT: 85 seconds — ABNORMAL HIGH (ref 24–36)
aPTT: 56 seconds — ABNORMAL HIGH (ref 24–36)

## 2022-03-29 LAB — CBG MONITORING, ED
Glucose-Capillary: 144 mg/dL — ABNORMAL HIGH (ref 70–99)
Glucose-Capillary: 154 mg/dL — ABNORMAL HIGH (ref 70–99)
Glucose-Capillary: 182 mg/dL — ABNORMAL HIGH (ref 70–99)

## 2022-03-29 LAB — HEMOGLOBIN A1C
Hgb A1c MFr Bld: 7.4 % — ABNORMAL HIGH (ref 4.8–5.6)
Mean Plasma Glucose: 166 mg/dL

## 2022-03-29 MED ORDER — INSULIN ASPART 100 UNIT/ML IJ SOLN
0.0000 [IU] | Freq: Three times a day (TID) | INTRAMUSCULAR | Status: DC
  Administered 2022-03-30 – 2022-03-31 (×3): 2 [IU] via SUBCUTANEOUS
  Administered 2022-03-31: 1 [IU] via SUBCUTANEOUS
  Administered 2022-03-31 – 2022-04-01 (×2): 2 [IU] via SUBCUTANEOUS
  Administered 2022-04-01 – 2022-04-02 (×4): 3 [IU] via SUBCUTANEOUS
  Administered 2022-04-02: 5 [IU] via SUBCUTANEOUS
  Administered 2022-04-03: 2 [IU] via SUBCUTANEOUS
  Administered 2022-04-03: 3 [IU] via SUBCUTANEOUS
  Administered 2022-04-03: 2 [IU] via SUBCUTANEOUS

## 2022-03-29 MED ORDER — FENTANYL CITRATE PF 50 MCG/ML IJ SOSY
25.0000 ug | PREFILLED_SYRINGE | INTRAMUSCULAR | Status: DC | PRN
  Administered 2022-03-29 – 2022-03-30 (×5): 50 ug via INTRAVENOUS
  Filled 2022-03-29 (×5): qty 1

## 2022-03-29 NOTE — ED Notes (Addendum)
Patient stood at bedside and upgraded to hospital inpatient bed at this time. Spouse at bedside.

## 2022-03-29 NOTE — Progress Notes (Signed)
*  PRELIMINARY RESULTS* Echocardiogram 2D Echocardiogram has been performed.  Arthur Wilson 03/29/2022, 11:20 AM

## 2022-03-29 NOTE — ED Notes (Signed)
Care link here to pick up pt  

## 2022-03-29 NOTE — Progress Notes (Signed)
ANTICOAGULATION CONSULT NOTE -  Pharmacy Consult for Heparin Indication: chest pain/ACS and atrial fibrillation  No Known Allergies  Patient Measurements: Height: '5\' 10"'$  (177.8 cm) Weight: 111.8 kg (246 lb 7.6 oz) IBW/kg (Calculated) : 73 Heparin Dosing Weight: 100 kg  Vital Signs: Temp: 100.5 F (38.1 C) (01/03 2017) Temp Source: Oral (01/03 2017) BP: 132/79 (01/03 2017) Pulse Rate: 87 (01/03 2017)  Labs: Recent Labs    03/28/22 1816 03/28/22 2029 03/29/22 1125 03/29/22 1911  HGB 13.3  --   --   --   HCT 40.1  --   --   --   PLT 118*  --   --   --   APTT  --   --  85* 56*  LABPROT 16.7*  --   --   --   INR 1.4*  --   --   --   CREATININE 2.71*  --  1.68*  --   TROPONINIHS 3,628* 3,084* 3,058*  --      Estimated Creatinine Clearance: 48.3 mL/min (A) (by C-G formula based on SCr of 1.68 mg/dL (H)).   Medical History: Past Medical History:  Diagnosis Date   Atrial fibrillation (HCC)    BPH (benign prostatic hyperplasia)    Cervical spondylosis without myelopathy    Essential hypertension    Impaired glucose tolerance    OSA on CPAP    Testicular hypofunction     Medications:  No current facility-administered medications on file prior to encounter.   Current Outpatient Medications on File Prior to Encounter  Medication Sig Dispense Refill   acetaminophen (TYLENOL) 500 MG tablet Take 1,000 mg by mouth every 6 (six) hours as needed for moderate pain.     ALPRAZolam (XANAX) 0.5 MG tablet Take 0.5 tablets by mouth at bedtime as needed for anxiety or sleep.     bisoprolol-hydrochlorothiazide (ZIAC) 5-6.25 MG tablet Take 1 tablet by mouth daily.     ELIQUIS 5 MG TABS tablet Take 5 mg by mouth 2 (two) times daily.     fexofenadine (ALLEGRA) 180 MG tablet Take 180 mg by mouth daily.     fluticasone (FLONASE) 50 MCG/ACT nasal spray Place 1 spray into both nostrils as needed for allergies.     furosemide (LASIX) 40 MG tablet Take 40 mg by mouth daily.      HYDROcodone-acetaminophen (NORCO) 7.5-325 MG tablet Take 0.5-1 tablets by mouth 2 (two) times daily as needed for moderate pain.     metFORMIN (GLUCOPHAGE) 500 MG tablet Take 500 mg by mouth See admin instructions. Take 1 daily for 14 days, then take 1 tablet twice daily. Start 03/14/22     olmesartan (BENICAR) 20 MG tablet Take 20 mg by mouth daily.     pregabalin (LYRICA) 100 MG capsule Take 1 twice daily 180 capsule 1   rosuvastatin (CRESTOR) 10 MG tablet Take 10 mg by mouth daily.       Assessment: Pharmacy consulted to dose heparin in patient with elevated troponin/NSTEMI, h/o Afib and Eliquis on hold, last dose 1/2 @ 0800.  Will need to monitor based on aPTT until correlation with heparin levels.  aPTT 56 - subtherapeutic. D/w RN - no issues with heparin infusing, no bleeding noted.   Goal of Therapy:  aPTT 66-102 sec Heparin level 0.3-0.7 units/ml Monitor platelets by anticoagulation protocol: Yes   Plan:  Increase heparin infusion to 1650 units/hr Check aPTT in 8 hours  Daily heparin level and aPTT  Dimple Nanas, PharmD, BCPS 03/29/2022 8:48  PM

## 2022-03-29 NOTE — ED Notes (Signed)
Lunch tray given to pt.

## 2022-03-29 NOTE — ED Notes (Signed)
Surveyor, quantity notified of pt requests and talking with pt.

## 2022-03-29 NOTE — Progress Notes (Signed)
ANTICOAGULATION CONSULT NOTE -  Pharmacy Consult for Heparin Indication: chest pain/ACS and atrial fibrillation  No Known Allergies  Patient Measurements:   Heparin Dosing Weight: 100 kg  Vital Signs: Temp: 97.7 F (36.5 C) (01/03 1101) Temp Source: Oral (01/03 1101) BP: 125/77 (01/03 1030) Pulse Rate: 87 (01/03 1030)  Labs: Recent Labs    03/28/22 1816 03/28/22 2029 03/29/22 1125  HGB 13.3  --   --   HCT 40.1  --   --   PLT 118*  --   --   APTT  --   --  85*  LABPROT 16.7*  --   --   INR 1.4*  --   --   CREATININE 2.71*  --  1.68*  TROPONINIHS 3,628* 3,084* 3,058*     CrCl cannot be calculated (Unknown ideal weight.).   Medical History: Past Medical History:  Diagnosis Date   Atrial fibrillation (HCC)    BPH (benign prostatic hyperplasia)    Cervical spondylosis without myelopathy    Essential hypertension    Impaired glucose tolerance    OSA on CPAP    Testicular hypofunction     Medications:  No current facility-administered medications on file prior to encounter.   Current Outpatient Medications on File Prior to Encounter  Medication Sig Dispense Refill   acetaminophen (TYLENOL) 500 MG tablet Take 1,000 mg by mouth every 6 (six) hours as needed for moderate pain.     ALPRAZolam (XANAX) 0.5 MG tablet Take 0.5 tablets by mouth at bedtime as needed for anxiety or sleep.     bisoprolol-hydrochlorothiazide (ZIAC) 5-6.25 MG tablet Take 1 tablet by mouth daily.     ELIQUIS 5 MG TABS tablet Take 5 mg by mouth 2 (two) times daily.     fexofenadine (ALLEGRA) 180 MG tablet Take 180 mg by mouth daily.     fluticasone (FLONASE) 50 MCG/ACT nasal spray Place 1 spray into both nostrils as needed for allergies.     furosemide (LASIX) 40 MG tablet Take 40 mg by mouth daily.     HYDROcodone-acetaminophen (NORCO) 7.5-325 MG tablet Take 0.5-1 tablets by mouth 2 (two) times daily as needed for moderate pain.     metFORMIN (GLUCOPHAGE) 500 MG tablet Take 500 mg by mouth  See admin instructions. Take 1 daily for 14 days, then take 1 tablet twice daily. Start 03/14/22     olmesartan (BENICAR) 20 MG tablet Take 20 mg by mouth daily.     pregabalin (LYRICA) 100 MG capsule Take 1 twice daily 180 capsule 1   rosuvastatin (CRESTOR) 10 MG tablet Take 10 mg by mouth daily.       Assessment: Pharmacy consulted to dose heparin in patient with elevated troponin/NSTEMI, h/o Afib and Eliquis on hold,  last dose 1/2 @ 0800.  Will need to monitor based on aPTT until correlation with heparin levels.  APTT 85- therapeutic Trop 3058 CBC WNL  Goal of Therapy:  aPTT 66-102 sec Heparin level 0.3-0.7 units/ml Monitor platelets by anticoagulation protocol: Yes   Plan:  Continue heparin infusion at 1450 units/hr Check aPTT in 8 hours   Margot Ables, PharmD Clinical Pharmacist 03/29/2022 12:07 PM

## 2022-03-29 NOTE — ED Notes (Signed)
Wife requesting pt sent to another hospital where a bed is available now so pt can get other tests that is needed. Attending aware of  wife wanting to talk to him.

## 2022-03-29 NOTE — ED Notes (Signed)
Attending stated pt can talk to his ortho Dr in [place]. Pt aware.

## 2022-03-29 NOTE — Hospital Course (Signed)
77 y.o. male with medical history significant of A-fib on Eliquis who presents to the emergency department due to low blood pressure.  Patient states that he banged his right knee and leg around Christmas time after which he started to have problems with the leg since it appeared bruised.  He went to his orthopedic physician, CAT scan and x-ray of the leg was done without any acute fracture.  He started to have bilateral swelling of lower extremity, so his PCP placed him on diuretic.  He went to his PCP today and BP was noted to be in hypotensive range, so he was sent to the ED for further evaluation.  Patient denies chest pain, shortness of breath, fever, chills, nausea, vomiting or abdominal pain.  Patient states that his blood pressure is usually around 115/75.  He has been on Lasix 40 mg since last few days.   ED Course:  In the emergency department, temperature was 97.5, HR was 110 bpm, BP was 83/64, respiratory rate 11/min and O2 sat was 96% on room air.  Workup in the ED showed normal CBC except for WBC of 11.5 and platelets of 118, BMP was normal except for chloride of 97, BUN/creatinine 90/2.71 (no prior labs for comparison) and blood glucose of 155.  BNP 531, troponin 3, 628 > 3,084, lactic acid 3.1 > 2.1.  Urinalysis was normal.  Blood culture pending.  Chest x-ray showed cardiomegaly with no additional acute cardiopulmonary process.  IV hydration was provided, cardiologist on-call at [facility] was consulted recommended starting patient on heparin drip and for patient to be admitted to [facility] for follow-up once patient arrives at [facility].

## 2022-03-29 NOTE — ED Notes (Signed)
Pt requesting MRI for right knee pain while he is admitted. Attending aware of request.

## 2022-03-29 NOTE — Progress Notes (Signed)
PROGRESS NOTE   Arthur Wilson  FAO:[id number] DOB: 07-10-1945 DOA: 03/28/2022 PCP: [name], MD   Chief Complaint  Patient presents with   Hypotension   Level of care: Telemetry Medical  Brief Admission History:  77 y.o. male with medical history significant of A-fib on Eliquis who presents to the emergency department due to low blood pressure.  Patient states that he banged his right knee and leg around Christmas time after which he started to have problems with the leg since it appeared bruised.  He went to his orthopedic physician, CAT scan and x-ray of the leg was done without any acute fracture.  He started to have bilateral swelling of lower extremity, so his PCP placed him on diuretic.  He went to his PCP today and BP was noted to be in hypotensive range, so he was sent to the ED for further evaluation.  Patient denies chest pain, shortness of breath, fever, chills, nausea, vomiting or abdominal pain.  Patient states that his blood pressure is usually around 115/75.  He has been on Lasix 40 mg since last few days.   ED Course:  In the emergency department, temperature was 97.5, HR was 110 bpm, BP was 83/64, respiratory rate 11/min and O2 sat was 96% on room air.  Workup in the ED showed normal CBC except for WBC of 11.5 and platelets of 118, BMP was normal except for chloride of 97, BUN/creatinine 90/2.71 (no prior labs for comparison) and blood glucose of 155.  BNP 531, troponin 3, 628 > 3,084, lactic acid 3.1 > 2.1.  Urinalysis was normal.  Blood culture pending.  Chest x-ray showed cardiomegaly with no additional acute cardiopulmonary process.  IV hydration was provided, cardiologist on-call at [facility] was consulted recommended starting patient on heparin drip and for patient to be admitted to [facility] for follow-up once patient arrives at [facility].     Assessment and Plan:  NSTEMI  -HS troponin trending down from 3600  -continue IV heparin infusion -appreciate cardiology consult   -pt admitted to [facility]   Permanent Atrial Fibrillation -currently rate controlled and remains on IV heparin for full anticoagulation -would use metoprolol if needed for rate control  AKI (presumed) -no baseline renal function labs found -follow BMP with treatments  -renally dose meds as able   Lactic Acidosis -resolved after hydration given in ED  Afib RVR - resolved now, follow telemetry   Hypotension -fortunately resolved after IV fluid   Type 2 DM -continue SSI coverage and CBG monitoring  Thrombocytopenia - recheck CBC in AM  Acute Heart Failure  -Pt presents very volume overloaded -follow up TTE ordered this admission -further recommendations to follow   DVT prophylaxis: IV heparin infusion Code Status: Full  Family Communication: wife bedside update 03/29/22 Disposition: Transfer to [facility] Remains inpatient appropriate because: IV heparin infusion required   Consultants:  Cardiology  Procedures:  TTE pending Venous US BLE pending Antimicrobials:  N/a   Subjective: No chest pain, no SOB.  No palpitations.  He c/o right knee pain.  Objective: Vitals:   03/29/22 0630 03/29/22 0700 03/29/22 0726 03/29/22 0730  BP: 119/83 (!) 125/92  114/82  Pulse: 70  79 90  Resp: '16 11 12 12  '$ Temp:      TempSrc:      SpO2: 94%  95% 95%    Intake/Output Summary (Last 24 hours) at 03/29/2022 1045 Last data filed at 03/29/2022 0617 Gross per 24 hour  Intake 37 ml  Output  650 ml  Net -613 ml    Examination:  General exam: Appears calm and comfortable  Respiratory system: Clear to auscultation. Respiratory effort normal. Cardiovascular system: normal S1 & S2 heard. No JVD, murmurs, rubs, gallops or clicks. 3+ pedal edema. Gastrointestinal system: Abdomen is nondistended, soft and nontender. No organomegaly or masses felt. Normal bowel sounds heard. Central nervous system: Alert and oriented. No focal neurological deficits. Extremities: 3+ edema BLEs, Symmetric 5 x 5  power. Skin: No rashes, lesions or ulcers. Psychiatry: Judgement and insight appear normal. Mood & affect appropriate.   Data Reviewed: I have personally reviewed following labs and imaging studies  CBC: Recent Labs  Lab 03/28/22 1816  WBC 11.5*  HGB 13.3  HCT 40.1  MCV 92.4  PLT 118*    Basic Metabolic Panel: Recent Labs  Lab 03/28/22 1816  NA 139  K 3.6  CL 97*  CO2 31  GLUCOSE 155*  BUN 90*  CREATININE 2.71*  CALCIUM 8.3*    CBG: Recent Labs  Lab 03/28/22 1907 03/29/22 0815  GLUCAP 166* 144*    Recent Results (from the past 240 hour(s))  Culture, blood (Routine x 2)     Status: None (Preliminary result)   Collection Time: 03/28/22  6:16 PM   Specimen: BLOOD  Result Value Ref Range Status   Specimen Description BLOOD LEFT ANTECUBITAL  Final   Special Requests   Final    BOTTLES DRAWN AEROBIC AND ANAEROBIC Blood Culture results may not be optimal due to an excessive volume of blood received in culture bottles Performed at [facility], [address], [place], [place] [address]    Culture PENDING  Incomplete   Report Status PENDING  Incomplete  Culture, blood (Routine x 2)     Status: None (Preliminary result)   Collection Time: 03/28/22  6:16 PM   Specimen: BLOOD  Result Value Ref Range Status   Specimen Description BLOOD BLOOD RIGHT ARM  Final   Special Requests   Final    BOTTLES DRAWN AEROBIC AND ANAEROBIC Blood Culture results may not be optimal due to an excessive volume of blood received in culture bottles Performed at [facility], [address], [place], [place] [address]    Culture PENDING  Incomplete   Report Status PENDING  Incomplete     Radiology Studies: DG Chest 2 View  Result Date: 03/28/2022 CLINICAL DATA:  Sepsis suspected EXAM: CHEST - 2 VIEW COMPARISON:  01/14/2020 FINDINGS: Mild cardiomegaly. No focal pulmonary opacity. No pleural effusion or pneumothorax. No acute osseous abnormality. IMPRESSION: Cardiomegaly with no additional  acute cardiopulmonary process. Electronically Signed   By: Merilyn Baba M.D.   On: 03/28/2022 18:51    Scheduled Meds:  insulin aspart  0-9 Units Subcutaneous TID WC   Continuous Infusions:  heparin 1,450 Units/hr (03/29/22 0737)     LOS: 1 day   Time spent: 25 mins  Jamere Stidham Wynetta Emery, MD How to contact the [facility] Attending or Consulting provider [place] or covering provider during after hours [place], for this patient?  Check the care team in [facility] and look for a) attending/consulting TR[facility] provider listed and b) the [facility] team listed Log into [email or web address] and use [place]'s universal password to access. If you do not have the password, please contact the hospital operator. Locate the [facility] provider you are looking for under [facility] and page to a number that you can be directly reached. If you still have difficulty reaching the provider, please page the  DOC (Director on Call) for the Hospitalists listed on amion for assistance.  03/29/2022, 10:45 AM

## 2022-03-29 NOTE — Consult Note (Addendum)
Cardiology Consultation   Patient ID: Arthur Wilson MRN: [id number]; DOB: 15-Apr-1945  Admit date: 03/28/2022 Date of Consult: 03/29/2022  PCP:  Arthur Wilson, Arthur Wilson Providers Cardiologist:  Arthur Lesches, MD        Patient Profile:   Arthur Wilson is a 77 y.o. male with a hx of permanent Afib, HTN, OSA who is being seen 03/29/2022 for the evaluation of NSTEMI, hypotension,AKI at the request of Dr. Wynetta Wilson.  History of Present Illness:   Mr. Arthur Wilson is a 77 yo male patient with above PMHx. Patient had a fall and severe knee pain and saw ortho. CT showed no fracture but mild deep vein dilation. He developed worsening LE edema and was started on a diuretic by PCP about 4 days ago. Yesterday he went to PCP and was hypotensive so sent to ED. He denied any symptoms of chest pain, dyspnea, dizziness, palpitations. Was on a diet and lost down to 238 lb but with edema back up to 250 lbs. HR 110, BP 83/64, Troponin 3628, 3084, Crt 2.71, lactic acid 3.1>2.1. started on IV heparin, IV hydration.   Past Medical History:  Diagnosis Date   Atrial fibrillation (HCC)    BPH (benign prostatic hyperplasia)    Cervical spondylosis without myelopathy    Essential hypertension    Impaired glucose tolerance    OSA on CPAP    Testicular hypofunction     Past Surgical History:  Procedure Laterality Date   FOOT SURGERY  01/10/2016   Bunion removal   KNEE SURGERY  1992   removal of ACL from ski fall     Home Medications:  Prior to Admission medications   Medication Sig Start Date End Date Taking? Authorizing Provider  acetaminophen (TYLENOL) 500 MG tablet Take 1,000 mg by mouth every 6 (six) hours as needed for moderate pain.   Yes [provider]  ALPRAZolam Duanne Moron) 0.5 MG tablet Take 0.5 tablets by mouth at bedtime as needed for anxiety or sleep. 07/05/18  Yes [provider]  bisoprolol-hydrochlorothiazide (ZIAC) 5-6.25 MG tablet Take 1 tablet by mouth  daily. 02/17/19  Yes [provider]  ELIQUIS 5 MG TABS tablet Take 5 mg by mouth 2 (two) times daily. 12/31/19  Yes [provider]  fexofenadine (ALLEGRA) 180 MG tablet Take 180 mg by mouth daily.   Yes [provider]  fluticasone (FLONASE) 50 MCG/ACT nasal spray Place 1 spray into both nostrils as needed for allergies. 01/12/20  Yes [provider]  furosemide (LASIX) 40 MG tablet Take 40 mg by mouth daily. 03/23/22  Yes [provider]  HYDROcodone-acetaminophen (NORCO) 7.5-325 MG tablet Take 0.5-1 tablets by mouth 2 (two) times daily as needed for moderate pain.   Yes [provider]  metFORMIN (GLUCOPHAGE) 500 MG tablet Take 500 mg by mouth See admin instructions. Take 1 daily for 14 days, then take 1 tablet twice daily. Start 03/14/22   Yes [provider]  olmesartan (BENICAR) 20 MG tablet Take 20 mg by mouth daily. 02/23/21  Yes [provider]  pregabalin (LYRICA) 100 MG capsule Take 1 twice daily 05/12/21  Yes Suzzanne Cloud, NP  rosuvastatin (CRESTOR) 10 MG tablet Take 10 mg by mouth daily.   Yes [provider]    Inpatient Medications: Scheduled Meds:  insulin aspart  0-9 Units Subcutaneous TID WC   Continuous Infusions:  heparin 1,450 Units/hr (03/29/22 0737)    Allergies:   No Known Allergies  Social History:   Social History   Socioeconomic History   Marital status: Married    Spouse name: Arthur Wilson   Number of children: 3   Years of education: BS-Pharm   Highest education level: Not on file  Occupational History   Occupation: pharmacist     Comment: Agricultural engineer  Tobacco Use   Smoking status: Never   Smokeless tobacco: Never  Vaping Use   Vaping Use: Never used  Substance and Sexual Activity   Alcohol use: No   Drug use: No   Sexual activity: Not on file  Other Topics Concern   Not on file  Social History Narrative   Lives with wife   Patient is right handed.   Patient  drinks caffeine a few times a week.   Social Determinants of Health   Financial Resource Strain: Not on file  Food Insecurity: Not on file  Transportation Needs: Not on file  Physical Activity: Not on file  Stress: Not on file  Social Connections: Not on file  Intimate Partner Violence: Not on file    Family History:     Family History  Problem Relation Age of Onset   Breast cancer Mother    Hypertension Father    Heart attack Father    Breast cancer Sister    Hypertension Paternal Grandfather    Prostate cancer Paternal Uncle      ROS:  Please see the history of present illness.  Review of Systems  Constitutional: Positive for weight gain.  HENT: Negative.    Cardiovascular:  Positive for irregular heartbeat and leg swelling.  Respiratory:  Positive for snoring.   Endocrine: Negative.   Hematologic/Lymphatic: Negative.   Musculoskeletal:  Positive for joint pain.  Gastrointestinal: Negative.   Genitourinary: Negative.   Neurological: Negative.     All other ROS reviewed and negative.     Physical Exam/Data:   Vitals:   03/29/22 0630 03/29/22 0700 03/29/22 0726 03/29/22 0730  BP: 119/83 (!) 125/92  114/82  Pulse: 70  79 90  Resp: '16 11 12 12  '$ Temp:      TempSrc:      SpO2: 94%  95% 95%    Intake/Output Summary (Last 24 hours) at 03/29/2022 0841 Last data filed at 03/29/2022 0617 Gross per 24 hour  Intake 37 ml  Output 650 ml  Net -613 ml      12/06/2021    1:25 PM 04/11/2021    1:09 PM 01/27/2021    2:25 PM  Last 3 Weights  Weight (lbs) 246 lb 6.4 oz 245 lb 248 lb  Weight (kg) 111.766 kg 111.131 kg 112.492 kg     There is no height or weight on file to calculate BMI.  General:  Well nourished, well developed, in no acute distress  HEENT: normal Neck: no JVD Vascular: No carotid bruits; Distal pulses 2+ bilaterally Cardiac:  normal S1, S2; irreg irreg no murmur   Lungs:  decreased breath sounds with few basilar crackles  Abd: soft, nontender, no  hepatomegaly  Ext: significant bilateral edema  Musculoskeletal:  No deformities, BUE and BLE strength normal and equal Skin: warm and dry  Neuro:  CNs 2-12 intact, no focal abnormalities noted Psych:  Normal affect   EKG:  The EKG was personally reviewed and demonstrates:  Afib 113/m no acute change Telemetry:  Telemetry was personally reviewed and demonstrates:  Afib with RVR on admission, now Afib rate controlled some PVC's  Relevant CV Studies:  Echocardiogram: 10/2019  IMPRESSIONS    1. Left ventricular ejection fraction, by estimation, is 60 to 65%. The  left ventricle has normal function. The left ventricle has no regional  wall motion abnormalities. There is mild left ventricular hypertrophy.  Left ventricular diastolic parameters  are indeterminate.   2. Right ventricular systolic function is normal. The right ventricular  size is normal.   3. Left atrial size was moderately dilated.   4. Right atrial size was moderately dilated.   5. The mitral valve is normal in structure. No evidence of mitral valve  regurgitation. No evidence of mitral stenosis.   6. The aortic valve is tricuspid. Aortic valve regurgitation is not  visualized. No aortic stenosis is present.   7. Mild pulmonary HTN, PASP is 36 mmHg.   8. The inferior vena cava is normal in size with greater than 50%  respiratory variability, suggesting right atrial pressure of 3 mmHg.     Laboratory Data:  High Sensitivity Troponin:   Recent Labs  Lab 03/28/22 1816 03/28/22 2029  TROPONINIHS 3,628* 3,084*     Chemistry Recent Labs  Lab 03/28/22 1816  NA 139  K 3.6  CL 97*  CO2 31  GLUCOSE 155*  BUN 90*  CREATININE 2.71*  CALCIUM 8.3*  GFRNONAA 24*  ANIONGAP 11      Hematology Recent Labs  Lab 03/28/22 1816  WBC 11.5*  RBC 4.34  HGB 13.3  HCT 40.1  MCV 92.4  MCH 30.6  MCHC 33.2  RDW 15.5  PLT 118*    BNP Recent Labs  Lab 03/28/22 1816  BNP 531.0*     Radiology/Studies:  DG  Chest 2 View  Result Date: 03/28/2022 CLINICAL DATA:  Sepsis suspected EXAM: CHEST - 2 VIEW COMPARISON:  01/14/2020 FINDINGS: Mild cardiomegaly. No focal pulmonary opacity. No pleural effusion or pneumothorax. No acute osseous abnormality. IMPRESSION: Cardiomegaly with no additional acute cardiopulmonary process. Electronically Signed   By: Merilyn Baba M.D.   On: 03/28/2022 18:51     Assessment and Plan:   NSTEMI troponin 3628, 3084. Unusual presentation with no cardiac symptoms other than LE edema. CT knee 03/22/22 showed mild dilation of deep vein prox LE but he's been on eliquis. Can't do CTA because of AKI but could consider PE. Currently comfortable on IV heparin. Await echo and plan to transfer to [facility] on hospitalist service.  AKI Crt 2.71-repeat since given IV fluids was on HCTZ 6.25 mg(ZIAC), benicar long term  and Lasix for 4 days all on hold  LE edema recently started on lasix 4 days ago. Still with significant edema  Afib with RVR on admission but now controlled and on Eliquis. Bisoprolol 5 mg on hold with hypotension but will likely need rate control meds. BP improved so could add metoprolol if needed.  HTN was hypotensive on admission but now stable after IV fluids.  OSA on CPAP   Risk Assessment/Risk Scores:     TIMI Risk Score for Unstable Angina or Non-ST Elevation MI:   The patient's TIMI risk score is  , which indicates a  % risk of all cause mortality, new or recurrent myocardial infarction or need for urgent revascularization in the next 14 days.   CHA2DS2-VASc Score = 3   This indicates a 3.2% annual risk of stroke. The patient's score is based upon: CHF History: 0 HTN History: 1 Diabetes History: 1 Stroke History: 0 Vascular Disease History: 0 Age Score: 1 Gender Score: 0      For questions or  updates, please contact [place] Please consult [email or web address] for contact info under    Signed, Ermalinda Barrios, PA-C 03/29/2022 8:41  AM    Attending note:  Patient seen and examined.  Case discussed with Ms. Vita Barley and chart reviewed since last office encounter.  Mr. Jamar has a history of permanent atrial fibrillation, hypertension, and OSA on CPAP.  LVEF 60 to 65% with moderate left atrial enlargement, normal RV contraction, and estimated PASP 36 mmHg as of 2021.  Still working at [employer] and reports no major change in status until a mechanical fall (no syncope or dizziness) over the holidays.  Injured his right knee, although no acute findings (no fracture or dislocation) by imaging studies and evaluation by Dr. Wynelle Link.  He states that he has had progressive leg swelling to above his knees, actually preceding this fall, but during his more acute evaluation was placed on Lasix by PCP which he has taken over the last 4 days.  Prior to all this he had also been working on diet and lost about 7 pounds.  He presents now hypotensive and with acute renal failure, also enzymatic evidence of NSTEMI although in the absence of chest pain.  BNP is 531 with initial high-sensitivity troponin I 3628.  ECG shows atrial fibrillation with no acute ST segment changes.  Patient currently afebrile, most recent blood pressure 118/79 (received IV fluids in the ER), heart rate currently in the 70s to 80s in atrial fibrillation by telemetry which I personally reviewed.  Patient reports no active chest pain or breathlessness.  Lungs are clear.  Cardiac exam with irregularly irregular rhythm and no gallop, abdomen nontender.  He has 3+ bilateral leg edema above the knees.  Pertinent lab work includes potassium 3.6, BUN 90, creatinine 2.71 (1.19 in August 2023), lactic acid initially 3.1 down to 1.1, WBC 11.5, hemoglobin 13.3, platelets 118, INR 1.1, blood culture sent and pending.  Chest x-ray reports cardiomegaly with no acute process.  CT of the right knee from December 27 reported no acute fracture or dislocation with degenerative  changes, small knee joint effusion, nonspecific subcutaneous edema, and nonspecific deep vein dilatation in the proximal lower leg.  Patient currently on IV heparin, Eliquis and antihypertensives held.  He received IV fluids in the ER and is hemodynamically stable at this time.  Plan is for him to be transferred to the hospitalist service at [facility] with cardiology following in consultation.  Proceed with echocardiogram.  He is not a candidate for cardiac catheterization now, however this may ultimately need to be considered presuming his renal function improves, particularly if he has evidence of new cardiomyopathy.  He does not report any chest pain so still could be a type II NSTEMI or even myocarditis.  Consider lower extremity venous Dopplers although likelihood of DVT and/or pulmonary embolus would still be low in the setting of regular Eliquis use as an outpatient.  He reports compliance with medications and no palpitations to indicate uncontrolled atrial fibrillation and tachycardia induced cardiomyopathy.  Our service will continue to follow and can make further recommendations as more information becomes available.  Satira Sark, M.D., F.A.C.C.

## 2022-03-29 NOTE — TOC Progression Note (Signed)
  Transition of Care [facility]) Screening Note   Patient Details  Name: Arthur Wilson Date of Birth: 26-Apr-1945   Transition of Care [facility]) CM/SW Contact:    Boneta Lucks, RN Phone Number: 03/29/2022, 1:12 PM    Transition of Care Department [facility]) has reviewed patient and no TOC needs have been identified at this time. We will continue to monitor patient advancement through interdisciplinary progression rounds. If new patient transition needs arise, please place a TOC consult.     Living arrangements for the past 2 months: [place]

## 2022-03-29 NOTE — ED Notes (Signed)
US at bedside

## 2022-03-30 ENCOUNTER — Encounter (HOSPITAL_COMMUNITY)
Admission: EM | Disposition: A | Discharge status: Home Health | Payer: Self-pay | Source: Home / Self Care | Attending: Internal Medicine

## 2022-03-30 ENCOUNTER — Encounter (HOSPITAL_COMMUNITY): Payer: Self-pay | Admitting: Internal Medicine

## 2022-03-30 DIAGNOSIS — I214 Non-ST elevation (NSTEMI) myocardial infarction: Principal | ICD-10-CM

## 2022-03-30 DIAGNOSIS — I219 Acute myocardial infarction, unspecified: Secondary | ICD-10-CM

## 2022-03-30 DIAGNOSIS — I4819 Other persistent atrial fibrillation: Secondary | ICD-10-CM

## 2022-03-30 DIAGNOSIS — I2510 Atherosclerotic heart disease of native coronary artery without angina pectoris: Secondary | ICD-10-CM

## 2022-03-30 HISTORY — DX: Acute myocardial infarction, unspecified: I21.9

## 2022-03-30 HISTORY — PX: RIGHT/LEFT HEART CATH AND CORONARY ANGIOGRAPHY: CATH118266

## 2022-03-30 HISTORY — PX: CORONARY PRESSURE/FFR STUDY: CATH118243

## 2022-03-30 HISTORY — PX: CORONARY STENT INTERVENTION: CATH118234

## 2022-03-30 LAB — BASIC METABOLIC PANEL
Anion gap: 13 (ref 5–15)
BUN: 50 mg/dL — ABNORMAL HIGH (ref 8–23)
CO2: 27 mmol/L (ref 22–32)
Calcium: 8.2 mg/dL — ABNORMAL LOW (ref 8.9–10.3)
Chloride: 99 mmol/L (ref 98–111)
Creatinine, Ser: 1.44 mg/dL — ABNORMAL HIGH (ref 0.61–1.24)
GFR, Estimated: 51 mL/min — ABNORMAL LOW (ref >60)
Glucose, Bld: 165 mg/dL — ABNORMAL HIGH (ref 70–99)
Potassium: 3.3 mmol/L — ABNORMAL LOW (ref 3.5–5.1)
Sodium: 139 mmol/L (ref 135–145)

## 2022-03-30 LAB — POCT ACTIVATED CLOTTING TIME
Activated Clotting Time: 255 seconds
Activated Clotting Time: 287 seconds
Activated Clotting Time: 271 s

## 2022-03-30 LAB — GLUCOSE, CAPILLARY
Glucose-Capillary: 174 mg/dL — ABNORMAL HIGH (ref 70–99)
Glucose-Capillary: 189 mg/dL — ABNORMAL HIGH (ref 70–99)
Glucose-Capillary: 189 mg/dL — ABNORMAL HIGH (ref 70–99)
Glucose-Capillary: 177 mg/dL — ABNORMAL HIGH (ref 70–99)

## 2022-03-30 LAB — POCT I-STAT EG7
Acid-Base Excess: 7 mmol/L — ABNORMAL HIGH (ref 0.0–2.0)
Bicarbonate: 31.1 mmol/L — ABNORMAL HIGH (ref 20.0–28.0)
Calcium, Ion: 1.17 mmol/L (ref 1.15–1.40)
HCT: 36 % — ABNORMAL LOW (ref 39.0–52.0)
Hemoglobin: 12.2 g/dL — ABNORMAL LOW (ref 13.0–17.0)
O2 Saturation: 54 %
Potassium: 4.1 mmol/L (ref 3.5–5.1)
Sodium: 141 mmol/L (ref 135–145)
TCO2: 32 mmol/L (ref 22–32)
pCO2, Ven: 42.2 mmHg — ABNORMAL LOW (ref 44–60)
pH, Ven: 7.476 — ABNORMAL HIGH (ref 7.25–7.43)
pO2, Ven: 27 mmHg — CL (ref 32–45)

## 2022-03-30 LAB — POCT I-STAT 7, (LYTES, BLD GAS, ICA,H+H)
Acid-Base Excess: 7 mmol/L — ABNORMAL HIGH (ref 0.0–2.0)
Acid-Base Excess: 6 mmol/L — ABNORMAL HIGH (ref 0.0–2.0)
Bicarbonate: 30.4 mmol/L — ABNORMAL HIGH (ref 20.0–28.0)
Bicarbonate: 30.4 mmol/L — ABNORMAL HIGH (ref 20.0–28.0)
Calcium, Ion: 1.18 mmol/L (ref 1.15–1.40)
Calcium, Ion: 1.19 mmol/L (ref 1.15–1.40)
HCT: 35 % — ABNORMAL LOW (ref 39.0–52.0)
HCT: 36 % — ABNORMAL LOW (ref 39.0–52.0)
Hemoglobin: 11.9 g/dL — ABNORMAL LOW (ref 13.0–17.0)
Hemoglobin: 12.2 g/dL — ABNORMAL LOW (ref 13.0–17.0)
O2 Saturation: 90 %
O2 Saturation: 60 %
Potassium: 4.2 mmol/L (ref 3.5–5.1)
Potassium: 4.1 mmol/L (ref 3.5–5.1)
Sodium: 140 mmol/L (ref 135–145)
Sodium: 140 mmol/L (ref 135–145)
TCO2: 32 mmol/L (ref 22–32)
TCO2: 32 mmol/L (ref 22–32)
pCO2 arterial: 38.9 mmHg (ref 32–48)
pCO2 arterial: 42.9 mmHg (ref 32–48)
pH, Arterial: 7.501 — ABNORMAL HIGH (ref 7.35–7.45)
pH, Arterial: 7.459 — ABNORMAL HIGH (ref 7.35–7.45)
pO2, Arterial: 54 mmHg — ABNORMAL LOW (ref 83–108)
pO2, Arterial: 30 mmHg — CL (ref 83–108)

## 2022-03-30 LAB — MAGNESIUM: Magnesium: 1.6 mg/dL — ABNORMAL LOW (ref 1.7–2.4)

## 2022-03-30 LAB — APTT: aPTT: 93 seconds — ABNORMAL HIGH (ref 24–36)

## 2022-03-30 LAB — TROPONIN I (HIGH SENSITIVITY)
Troponin I (High Sensitivity): 3058 ng/L (ref <18)
Troponin I (High Sensitivity): 2688 ng/L (ref <18)

## 2022-03-30 LAB — HEPARIN LEVEL (UNFRACTIONATED): Heparin Unfractionated: >1.1 IU/mL — ABNORMAL HIGH (ref 0.30–0.70)

## 2022-03-30 SURGERY — RIGHT/LEFT HEART CATH AND CORONARY ANGIOGRAPHY
Anesthesia: LOCAL

## 2022-03-30 MED ORDER — MAGNESIUM SULFATE 2 GM/50ML IV SOLN
2.0000 g | Freq: Once | INTRAVENOUS | Status: AC
  Administered 2022-03-30: 2 g via INTRAVENOUS
  Filled 2022-03-30: qty 50

## 2022-03-30 MED ORDER — VERAPAMIL HCL 2.5 MG/ML IV SOLN
INTRAVENOUS | Status: DC | PRN
  Administered 2022-03-30: 10 mL via INTRA_ARTERIAL

## 2022-03-30 MED ORDER — POTASSIUM CHLORIDE CRYS ER 20 MEQ PO TBCR
30.0000 meq | EXTENDED_RELEASE_TABLET | ORAL | Status: AC
  Administered 2022-03-30 (×2): 30 meq via ORAL
  Filled 2022-03-30 (×2): qty 1

## 2022-03-30 MED ORDER — HEPARIN (PORCINE) IN NACL 1000-0.9 UT/500ML-% IV SOLN
INTRAVENOUS | Status: AC
  Filled 2022-03-30: qty 500

## 2022-03-30 MED ORDER — LIDOCAINE HCL (PF) 1 % IJ SOLN
INTRAMUSCULAR | Status: DC | PRN
  Administered 2022-03-30: 5 mL via INTRADERMAL
  Administered 2022-03-30: 10 mL via INTRADERMAL

## 2022-03-30 MED ORDER — CLOPIDOGREL BISULFATE 300 MG PO TABS
ORAL_TABLET | ORAL | Status: DC | PRN
  Administered 2022-03-30: 600 mg via ORAL

## 2022-03-30 MED ORDER — ATORVASTATIN CALCIUM 40 MG PO TABS
40.0000 mg | ORAL_TABLET | Freq: Every day | ORAL | Status: DC
  Administered 2022-03-30 – 2022-04-03 (×5): 40 mg via ORAL
  Filled 2022-03-30 (×6): qty 1

## 2022-03-30 MED ORDER — SODIUM CHLORIDE 0.9 % IV SOLN: 250.0000 mL | INTRAVENOUS | Status: DC | PRN

## 2022-03-30 MED ORDER — MIDAZOLAM HCL 2 MG/2ML IJ SOLN
INTRAMUSCULAR | Status: AC
  Filled 2022-03-30: qty 2

## 2022-03-30 MED ORDER — VERAPAMIL HCL 2.5 MG/ML IV SOLN
INTRAVENOUS | Status: AC
  Filled 2022-03-30: qty 2

## 2022-03-30 MED ORDER — SODIUM CHLORIDE 0.9% FLUSH
3.0000 mL | Freq: Two times a day (BID) | INTRAVENOUS | Status: DC
  Administered 2022-03-30 – 2022-04-03 (×8): 3 mL via INTRAVENOUS

## 2022-03-30 MED ORDER — METOPROLOL TARTRATE 25 MG PO TABS
25.0000 mg | ORAL_TABLET | Freq: Four times a day (QID) | ORAL | Status: DC
  Administered 2022-03-30 – 2022-03-31 (×3): 25 mg via ORAL
  Filled 2022-03-30 (×3): qty 1

## 2022-03-30 MED ORDER — MIDAZOLAM HCL 2 MG/2ML IJ SOLN
INTRAMUSCULAR | Status: DC | PRN
  Administered 2022-03-30 (×4): 1 mg via INTRAVENOUS

## 2022-03-30 MED ORDER — SODIUM CHLORIDE 0.9 % WEIGHT BASED INFUSION
1.0000 mL/kg/h | INTRAVENOUS | Status: DC
  Administered 2022-03-30: 1 mL/kg/h via INTRAVENOUS

## 2022-03-30 MED ORDER — SODIUM CHLORIDE 0.9% FLUSH: 3.0000 mL | INTRAVENOUS | Status: DC | PRN

## 2022-03-30 MED ORDER — ACETAMINOPHEN 325 MG PO TABS
650.0000 mg | ORAL_TABLET | ORAL | Status: DC | PRN
  Administered 2022-03-30 – 2022-04-01 (×3): 650 mg via ORAL
  Filled 2022-03-30 (×3): qty 2

## 2022-03-30 MED ORDER — HEPARIN SODIUM (PORCINE) 1000 UNIT/ML IJ SOLN
INTRAMUSCULAR | Status: AC
  Filled 2022-03-30: qty 10

## 2022-03-30 MED ORDER — IOHEXOL 350 MG/ML SOLN
INTRAVENOUS | Status: DC | PRN
  Administered 2022-03-30: 135 mL

## 2022-03-30 MED ORDER — SODIUM CHLORIDE 0.9 % WEIGHT BASED INFUSION: 3.0000 mL/kg/h | INTRAVENOUS | Status: DC

## 2022-03-30 MED ORDER — HEPARIN (PORCINE) IN NACL 1000-0.9 UT/500ML-% IV SOLN
INTRAVENOUS | Status: DC | PRN
  Administered 2022-03-30 (×3): 500 mL

## 2022-03-30 MED ORDER — HEPARIN (PORCINE) IN NACL 1000-0.9 UT/500ML-% IV SOLN
INTRAVENOUS | Status: AC
  Filled 2022-03-30: qty 1000

## 2022-03-30 MED ORDER — CLOPIDOGREL BISULFATE 300 MG PO TABS
ORAL_TABLET | ORAL | Status: AC
  Filled 2022-03-30: qty 2

## 2022-03-30 MED ORDER — METOPROLOL TARTRATE 5 MG/5ML IV SOLN
2.5000 mg | INTRAVENOUS | Status: AC
  Administered 2022-03-30 (×2): 2.5 mg via INTRAVENOUS
  Filled 2022-03-30: qty 5

## 2022-03-30 MED ORDER — HEPARIN SODIUM (PORCINE) 1000 UNIT/ML IJ SOLN
INTRAMUSCULAR | Status: DC | PRN
  Administered 2022-03-30: 5000 [IU] via INTRAVENOUS
  Administered 2022-03-30: 3000 [IU] via INTRAVENOUS
  Administered 2022-03-30: 6000 [IU] via INTRAVENOUS
  Administered 2022-03-30: 4000 [IU] via INTRAVENOUS

## 2022-03-30 MED ORDER — POLYVINYL ALCOHOL 1.4 % OP SOLN: 2.0000 [drp] | OPHTHALMIC | Status: DC | PRN

## 2022-03-30 MED ORDER — CLOPIDOGREL BISULFATE 75 MG PO TABS
75.0000 mg | ORAL_TABLET | Freq: Every day | ORAL | Status: DC
  Administered 2022-03-31 – 2022-04-04 (×5): 75 mg via ORAL
  Filled 2022-03-30 (×5): qty 1

## 2022-03-30 MED ORDER — FENTANYL CITRATE (PF) 100 MCG/2ML IJ SOLN
INTRAMUSCULAR | Status: DC | PRN
  Administered 2022-03-30 (×4): 25 ug via INTRAVENOUS
  Administered 2022-03-30: 25 ug

## 2022-03-30 MED ORDER — LIDOCAINE HCL (PF) 1 % IJ SOLN
INTRAMUSCULAR | Status: AC
  Filled 2022-03-30: qty 30

## 2022-03-30 MED ORDER — HEPARIN (PORCINE) 25000 UT/250ML-% IV SOLN
1500.0000 [IU]/h | INTRAVENOUS | Status: DC
  Administered 2022-03-30: 1650 [IU]/h via INTRAVENOUS
  Filled 2022-03-30: qty 250

## 2022-03-30 MED ORDER — LABETALOL HCL 5 MG/ML IV SOLN: 10.0000 mg | INTRAVENOUS | Status: DC | PRN

## 2022-03-30 MED ORDER — FENTANYL CITRATE (PF) 100 MCG/2ML IJ SOLN
INTRAMUSCULAR | Status: AC
  Filled 2022-03-30: qty 2

## 2022-03-30 MED ORDER — HYDRALAZINE HCL 20 MG/ML IJ SOLN: 10.0000 mg | INTRAMUSCULAR | Status: DC | PRN

## 2022-03-30 MED ORDER — ASPIRIN 81 MG PO CHEW
81.0000 mg | CHEWABLE_TABLET | ORAL | Status: AC
  Administered 2022-03-30: 81 mg via ORAL
  Filled 2022-03-30: qty 1

## 2022-03-30 MED ORDER — ONDANSETRON HCL 4 MG/2ML IJ SOLN: 4.0000 mg | Freq: Four times a day (QID) | INTRAMUSCULAR | Status: DC | PRN

## 2022-03-30 MED ORDER — SODIUM CHLORIDE 0.9 % WEIGHT BASED INFUSION: 1.0000 mL/kg/h | INTRAVENOUS | Status: DC

## 2022-03-30 MED ORDER — FENTANYL CITRATE PF 50 MCG/ML IJ SOSY
25.0000 ug | PREFILLED_SYRINGE | INTRAMUSCULAR | Status: DC | PRN
  Administered 2022-03-30 – 2022-04-04 (×16): 25 ug via INTRAVENOUS
  Filled 2022-03-30 (×17): qty 1

## 2022-03-30 MED ORDER — METOPROLOL TARTRATE 25 MG PO TABS: 25.0000 mg | ORAL_TABLET | Freq: Two times a day (BID) | ORAL | Status: DC

## 2022-03-30 MED ORDER — ASPIRIN 81 MG PO CHEW
81.0000 mg | CHEWABLE_TABLET | Freq: Every day | ORAL | Status: DC
  Administered 2022-03-31 – 2022-04-04 (×5): 81 mg via ORAL
  Filled 2022-03-30 (×5): qty 1

## 2022-03-30 MED ORDER — DICLOFENAC SODIUM 1 % EX GEL
2.0000 g | Freq: Four times a day (QID) | CUTANEOUS | Status: DC
  Administered 2022-03-31 – 2022-04-04 (×16): 2 g via TOPICAL
  Filled 2022-03-30 (×2): qty 100

## 2022-03-30 MED ORDER — ASPIRIN 81 MG PO CHEW: 81.0000 mg | CHEWABLE_TABLET | ORAL | Status: DC

## 2022-03-30 MED ORDER — SODIUM CHLORIDE 0.9 % IV SOLN: INTRAVENOUS | Status: DC

## 2022-03-30 SURGICAL SUPPLY — 41 items
BALL SAPPHIRE NC24 3.0X12 (BALLOONS) ×1
BALLN EMERGE MR 2.5X12 (BALLOONS) ×1
BALLN EMERGE MR 2.5X8 (BALLOONS) ×1
BALLN ~~LOC~~ EMERGE MR 2.75X12 (BALLOONS) ×1
BALLOON EMERGE MR 2.5X12 (BALLOONS) IMPLANT
BALLOON EMERGE MR 2.5X8 (BALLOONS) IMPLANT
BALLOON SAPPHIRE NC24 3.0X12 (BALLOONS) IMPLANT
BALLOON ~~LOC~~ EMERGE MR 2.75X12 (BALLOONS) IMPLANT
CATH DIAG 6FR JR4 (CATHETERS) IMPLANT
CATH DIAG 6FR PIGTAIL ANGLED (CATHETERS) IMPLANT
CATH EXPO 5F MPA-1 (CATHETERS) IMPLANT
CATH INFINITI 5 FR 3DRC (CATHETERS) IMPLANT
CATH INFINITI 6F AL1 (CATHETERS) IMPLANT
CATH LAUNCHER 5F 3DRIGHT (CATHETERS) IMPLANT
CATH LAUNCHER 6FR AL.75 (CATHETERS) IMPLANT
CATH LAUNCHER 6FR HS (CATHETERS) IMPLANT
CATH OPTITORQUE TIG 4.0 6F (CATHETERS) IMPLANT
CATH SWAN GANZ 7F STRAIGHT (CATHETERS) IMPLANT
CATH TELESCOPE 6F GEC (CATHETERS) IMPLANT
CATHETER LAUNCHER 5F 3DRIGHT (CATHETERS) ×1
CLOSURE PERCLOSE PROSTYLE (VASCULAR PRODUCTS) IMPLANT
DEVICE RAD COMP TR BAND LRG (VASCULAR PRODUCTS) IMPLANT
GLIDESHEATH SLEND SS 6F .021 (SHEATH) IMPLANT
GLIDESHEATH SLENDER 7FR .021G (SHEATH) IMPLANT
GUIDEWIRE INQWIRE 1.5J.035X260 (WIRE) IMPLANT
GUIDEWIRE PRESSURE X 175 (WIRE) IMPLANT
GUIDEWIRE VAS SION BLUE 190 (WIRE) IMPLANT
INQWIRE 1.5J .035X260CM (WIRE) ×2
KIT ENCORE 26 ADVANTAGE (KITS) IMPLANT
KIT ESSENTIALS PG (KITS) IMPLANT
KIT HEART LEFT (KITS) ×1 IMPLANT
PACK CARDIAC CATHETERIZATION (CUSTOM PROCEDURE TRAY) ×1 IMPLANT
SHEATH PINNACLE 6F 10CM (SHEATH) IMPLANT
SHEATH PROBE COVER 6X72 (BAG) IMPLANT
STENT ONYX FRONTIER 2.75X18 (Permanent Stent) IMPLANT
STENT ONYX FRONTIER 3.0X15 (Permanent Stent) IMPLANT
TRANSDUCER W/STOPCOCK (MISCELLANEOUS) ×1 IMPLANT
TUBING CIL FLEX 10 FLL-RA (TUBING) ×1 IMPLANT
WIRE EMERALD 3MM-J .025X260CM (WIRE) IMPLANT
WIRE EMERALD 3MM-J .035X150CM (WIRE) IMPLANT
WIRE MICRO SET SILHO 5FR 7 (SHEATH) IMPLANT

## 2022-03-30 NOTE — Progress Notes (Signed)
ANTICOAGULATION CONSULT NOTE  Pharmacy Consult for Heparin Indication: chest pain/ACS  No Known Allergies  Patient Measurements: Height: '5\' 10"'$  (177.8 cm) Weight: 110.6 kg (243 lb 13.3 oz) IBW/kg (Calculated) : 73  Heparin Dosing Weight: 97 kg  Vital Signs: Temp: 98.7 F (37.1 C) (01/04 1105) Temp Source: Oral (01/04 1105) BP: 114/76 (01/04 1105) Pulse Rate: 99 (01/04 1105)  Labs: Recent Labs    03/28/22 1816 03/28/22 2029 03/29/22 1125 03/29/22 1911 03/30/22 0457 03/30/22 0935  HGB 13.3  --   --   --   --   --   HCT 40.1  --   --   --   --   --   PLT 118*  --   --   --   --   --   APTT  --   --  85* 56*  --  93*  LABPROT 16.7*  --   --   --   --   --   INR 1.4*  --   --   --   --   --   HEPARINUNFRC  --   --   --   --   --  >1.10*  CREATININE 2.71*  --  1.68*  --  1.44*  --   TROPONINIHS 3,628* 3,084* 3,058*  --  2,688*  --     Estimated Creatinine Clearance: 56 mL/min (A) (by C-G formula based on SCr of 1.44 mg/dL (H)).   Assessment: 77 YO m with medical history significant for atrial fibrillation on apixaban PTA who is admitted for elevated troponins. Pharmacy consulted to manage heparin for ACS. Last apixaban dose 01/02 AM per medication history.0 Will adjust heparin based on aPTT until correlation with heparin levels.   aPTT 93 in goal range on 1650 units/hr. No issues with heparin infusion or signs of bleeding reported.  Goal of Therapy:  Heparin level 0.3-0.7 units/ml aPTT 66-102 seconds Monitor platelets by anticoagulation protocol: Yes   Plan:  Continue heparin infusion at 1650 units/hr Check heparin level in 8 hours and daily while on heparin Continue to monitor H&H and platelets F/u plans for [facility], possible this afternoon  Thank you for allowing pharmacy to be a part of this patient's care.  Ardyth Harps, PharmD Clinical Pharmacist

## 2022-03-30 NOTE — Interval H&P Note (Signed)
History and Physical Interval Note:  03/30/2022 1:56 PM  Arthur Wilson  has presented today for surgery, with the diagnosis of nstemi.  The various methods of treatment have been discussed with the patient and family. After consideration of risks, benefits and other options for treatment, the patient has consented to  Procedure(s): RIGHT/LEFT HEART CATH AND CORONARY ANGIOGRAPHY (N/A) as a surgical intervention.  The patient's history has been reviewed, patient examined, no change in status, stable for surgery.  I have reviewed the patient's chart and labs.  Questions were answered to the patient's satisfaction.     Early [place]

## 2022-03-30 NOTE — H&P (View-Only) (Signed)
Rounding Note    Patient Name: CLAVIN RUHLMAN Date of Encounter: 03/30/2022  [place] Cardiologist: Rozann Lesches, MD   Subjective   Patient appears to be quite uncomfortable this morning and he confirms that his right knee is causing significant pain. Otherwise patient continues to deny sensation of chest pain, shortness of breath/orthopnea. He confirms that other than low BP that prompted his ED visit, he has also not noticed any dizziness. It sounds like this low BP occurred following 3 days of oral lasix '40mg'$  that patient was given by his PCP after he was noted with bilateral weeping edema in lower extremities at his orthopedist's office on 12/28. Of note, patient reports chronic lower extremity edema for years that necessitates daily use of compression stockings and regular elevation of his feet.  Inpatient Medications    Scheduled Meds:  insulin aspart  0-9 Units Subcutaneous TID WC   potassium chloride  30 mEq Oral Q4H   Continuous Infusions:  heparin 1,650 Units/hr (03/30/22 0650)   PRN Meds: fentaNYL (SUBLIMAZE) injection   Vital Signs    Vitals:   03/30/22 0400 03/30/22 0431 03/30/22 0511 03/30/22 0722  BP: 111/69 111/69 130/79 127/79  Pulse: (!) 120 (!) 114 (!) 107 (!) 109  Resp: '17 18 17 18  '$ Temp: 98.8 F (37.1 C)  98.8 F (37.1 C) 98.5 F (36.9 C)  TempSrc: Oral  Oral Oral  SpO2: 94% 95% 95% 94%  Weight:      Height:        Intake/Output Summary (Last 24 hours) at 03/30/2022 0747 Last data filed at 03/30/2022 0650 Gross per 24 hour  Intake 715.02 ml  Output 2500 ml  Net -1784.98 ml      03/30/2022    1:00 AM 03/30/2022   12:48 AM 03/29/2022    7:59 PM  Last 3 Weights  Weight (lbs) 243 lb 13.3 oz 243 lb 13.3 oz 246 lb 7.6 oz  Weight (kg) 110.6 kg 110.6 kg 111.8 kg      Telemetry    Atrial fibrillation with highly variable ventricular rates - Personally Reviewed  ECG    No new tracing. Repeat ordered.  Physical Exam   GEN: No  acute distress.   Neck: No JVD Cardiac: irregularly irregular, no murmurs, rubs, or gallops.  Respiratory: Clear to auscultation bilaterally. GI: Soft, nontender, non-distended  MS: 3+ bilateral lower extremity edema. Neuro:  Nonfocal  Psych: Normal affect   Labs    High Sensitivity Troponin:   Recent Labs  Lab 03/28/22 1816 03/28/22 2029 03/29/22 1125 03/30/22 0457  TROPONINIHS 3,628* 3,084* 3,058* 2,688*     Chemistry Recent Labs  Lab 03/28/22 1816 03/29/22 1125 03/30/22 0457  NA 139 139 139  K 3.6 3.3* 3.3*  CL 97* 97* 99  CO2 31 32 27  GLUCOSE 155* 158* 165*  BUN 90* 74* 50*  CREATININE 2.71* 1.68* 1.44*  CALCIUM 8.3* 8.1* 8.2*  MG  --   --  1.6*  GFRNONAA 24* 42* 51*  ANIONGAP '11 10 13    '$ Lipids No results for input(s): "CHOL", "TRIG", "HDL", "LABVLDL", "LDLCALC", "CHOLHDL" in the last 168 hours.  Hematology Recent Labs  Lab 03/28/22 1816  WBC 11.5*  RBC 4.34  HGB 13.3  HCT 40.1  MCV 92.4  MCH 30.6  MCHC 33.2  RDW 15.5  PLT 118*   Thyroid No results for input(s): "TSH", "FREET4" in the last 168 hours.  BNP Recent Labs  Lab 03/28/22 1816  BNP 531.0*    DDimer No results for input(s): "DDIMER" in the last 168 hours.   Radiology    US Venous Img Lower Bilateral (DVT)  Result Date: 03/29/2022 CLINICAL DATA:  Bilateral leg edema EXAM: BILATERAL LOWER EXTREMITY VENOUS DOPPLER ULTRASOUND TECHNIQUE: Gray-scale sonography with compression, as well as color and duplex ultrasound, were performed to evaluate the deep venous system(s) from the level of the common femoral vein through the popliteal and proximal calf veins. COMPARISON:  None Available. FINDINGS: VENOUS Normal compressibility of the common femoral, superficial femoral, and popliteal veins, as well as the visualized calf veins. Visualized portions of profunda femoral vein and great saphenous vein unremarkable. No filling defects to suggest DVT on grayscale or color Doppler imaging. Doppler  waveforms show normal direction of venous flow, normal respiratory plasticity and response to augmentation. OTHER None. Limitations: none IMPRESSION: Negative for DVT in the bilateral lower extremities Electronically Signed   By: Merilyn Baba M.D.   On: 03/29/2022 12:07   ECHOCARDIOGRAM COMPLETE  Result Date: 03/29/2022    ECHOCARDIOGRAM REPORT   Patient Name:   AHAAN ZOBRIST Date of Exam: 03/29/2022 Medical Rec #:  [id number]       Height:       70.0 in Accession #:    [id number]      Weight:       246.4 lb Date of Birth:  1945/04/02      BSA:          2.281 m Patient Age:    77 years        BP:           15/77 mmHg Patient Gender: M               HR:           85 bpm. Exam Location:  [facility] Procedure: 2D Echo, Cardiac Doppler and Color Doppler Indications:    CHF-Acute Diastolic Q59.56  History:        Patient has prior history of Echocardiogram examinations, most                 recent 11/05/2019. Previous Myocardial Infarction and CAD,                 Arrythmias:Atrial Fibrillation; Risk Factors:Hypertension and                 Diabetes. OSA (obstructive sleep apnea) (From Hx).  Sonographer:    Alvino Chapel RCS Referring Phys: [id number] OLADAPO ADEFESO IMPRESSIONS  1. Left ventricular ejection fraction, by estimation, is 50 to 55%. The left ventricle has low normal function. The left ventricle demonstrates regional wall motion abnormalities - cannot exclude mild inferior mid to basal hypokinesis. There is mild left ventricular hypertrophy. Left ventricular diastolic parameters are indeterminate.  2. Right ventricular systolic function is mildly reduced. The right ventricular size is normal. There is normal pulmonary artery systolic pressure. The estimated right ventricular systolic pressure is 32.9 mmHg.  3. Left atrial size was moderately dilated.  4. Right atrial size was mildly dilated.  5. The mitral valve is grossly normal. Mild to moderate mitral valve regurgitation.  6. The aortic valve is  tricuspid. There is mild calcification of the aortic valve. Aortic valve regurgitation is not visualized.  7. The inferior vena cava is normal in size with greater than 50% respiratory variability, suggesting right atrial pressure of 3 mmHg. Comparison(s): Prior images reviewed side by side. LVEF low normal at 50-55% with  possible mild, mid to basal inferior hypokinesis. Mild RV dysfunction with upper normal estimated RVSP. FINDINGS  Left Ventricle: Left ventricular ejection fraction, by estimation, is 50 to 55%. The left ventricle has low normal function. The left ventricle demonstrates regional wall motion abnormalities. The left ventricular internal cavity size was normal in size. There is mild left ventricular hypertrophy. Left ventricular diastolic function could not be evaluated due to atrial fibrillation. Left ventricular diastolic parameters are indeterminate. Right Ventricle: The right ventricular size is normal. No increase in right ventricular wall thickness. Right ventricular systolic function is mildly reduced. There is normal pulmonary artery systolic pressure. The tricuspid regurgitant velocity is 2.76 m/s, and with an assumed right atrial pressure of 3 mmHg, the estimated right ventricular systolic pressure is 59.7 mmHg. Left Atrium: Left atrial size was moderately dilated. Right Atrium: Right atrial size was mildly dilated. Pericardium: Trivial pericardial effusion is present. The pericardial effusion is posterior to the left ventricle. Mitral Valve: The mitral valve is grossly normal. Mild to moderate mitral valve regurgitation. Tricuspid Valve: The tricuspid valve is grossly normal. Tricuspid valve regurgitation is mild. Aortic Valve: The aortic valve is tricuspid. There is mild calcification of the aortic valve. Aortic valve regurgitation is not visualized. Pulmonic Valve: The pulmonic valve was grossly normal. Pulmonic valve regurgitation is trivial. Aorta: The aortic root is normal in size and  structure. Venous: The inferior vena cava is normal in size with greater than 50% respiratory variability, suggesting right atrial pressure of 3 mmHg. IAS/Shunts: No atrial level shunt detected by color flow Doppler.  LEFT VENTRICLE PLAX 2D LVIDd:         4.70 cm LVIDs:         2.90 cm LV PW:         1.10 cm LV IVS:        1.30 cm LVOT diam:     2.10 cm LV SV:         59 LV SV Index:   26 LVOT Area:     3.46 cm  RIGHT VENTRICLE TAPSE (M-mode): 1.5 cm LEFT ATRIUM              Index        RIGHT ATRIUM           Index LA diam:        4.20 cm  1.84 cm/m   RA Area:     25.00 cm LA Vol (A2C):   128.0 ml 56.12 ml/m  RA Volume:   76.10 ml  33.36 ml/m LA Vol (A4C):   104.0 ml 45.60 ml/m LA Biplane Vol: 118.0 ml 51.73 ml/m  AORTIC VALVE LVOT Vmax:   75.70 cm/s LVOT Vmean:  55.000 cm/s LVOT VTI:    0.169 m  AORTA Ao Root diam: 3.70 cm MITRAL VALVE                TRICUSPID VALVE MV Area (PHT): 4.39 cm     TR Peak grad:   30.5 mmHg MV Decel Time: 173 msec     TR Vmax:        276.00 cm/s MV E velocity: 115.00 cm/s                             SHUNTS                             Systemic VTI:  0.17 m                             Systemic Diam: 2.10 cm Rozann Lesches MD Electronically signed by Rozann Lesches MD Signature Date/Time: 03/29/2022/11:42:43 AM    Final    DG Chest 2 View  Result Date: 03/28/2022 CLINICAL DATA:  Sepsis suspected EXAM: CHEST - 2 VIEW COMPARISON:  01/14/2020 FINDINGS: Mild cardiomegaly. No focal pulmonary opacity. No pleural effusion or pneumothorax. No acute osseous abnormality. IMPRESSION: Cardiomegaly with no additional acute cardiopulmonary process. Electronically Signed   By: Merilyn Baba M.D.   On: 03/28/2022 18:51    Cardiac Studies   03/29/22 TTE  IMPRESSIONS     1. Left ventricular ejection fraction, by estimation, is 50 to 55%. The  left ventricle has low normal function. The left ventricle demonstrates  regional wall motion abnormalities - cannot exclude mild inferior mid to   basal hypokinesis. There is mild  left ventricular hypertrophy. Left ventricular diastolic parameters are  indeterminate.   2. Right ventricular systolic function is mildly reduced. The right  ventricular size is normal. There is normal pulmonary artery systolic  pressure. The estimated right ventricular systolic pressure is 02.7 mmHg.   3. Left atrial size was moderately dilated.   4. Right atrial size was mildly dilated.   5. The mitral valve is grossly normal. Mild to moderate mitral valve  regurgitation.   6. The aortic valve is tricuspid. There is mild calcification of the  aortic valve. Aortic valve regurgitation is not visualized.   7. The inferior vena cava is normal in size with greater than 50%  respiratory variability, suggesting right atrial pressure of 3 mmHg.   Comparison(s): Prior images reviewed side by side. LVEF low normal at  50-55% with possible mild, mid to basal inferior hypokinesis. Mild RV  dysfunction with upper normal estimated RVSP.   FINDINGS   Left Ventricle: Left ventricular ejection fraction, by estimation, is 50  to 55%. The left ventricle has low normal function. The left ventricle  demonstrates regional wall motion abnormalities. The left ventricular  internal cavity size was normal in  size. There is mild left ventricular hypertrophy. Left ventricular  diastolic function could not be evaluated due to atrial fibrillation. Left  ventricular diastolic parameters are indeterminate.   Right Ventricle: The right ventricular size is normal. No increase in  right ventricular wall thickness. Right ventricular systolic function is  mildly reduced. There is normal pulmonary artery systolic pressure. The  tricuspid regurgitant velocity is 2.76  m/s, and with an assumed right atrial pressure of 3 mmHg, the estimated  right ventricular systolic pressure is 25.3 mmHg.   Left Atrium: Left atrial size was moderately dilated.   Right Atrium: Right atrial size  was mildly dilated.   Pericardium: Trivial pericardial effusion is present. The pericardial  effusion is posterior to the left ventricle.   Mitral Valve: The mitral valve is grossly normal. Mild to moderate mitral  valve regurgitation.   Tricuspid Valve: The tricuspid valve is grossly normal. Tricuspid valve  regurgitation is mild.   Aortic Valve: The aortic valve is tricuspid. There is mild calcification  of the aortic valve. Aortic valve regurgitation is not visualized.   Pulmonic Valve: The pulmonic valve was grossly normal. Pulmonic valve  regurgitation is trivial.   Aorta: The aortic root is normal in size and structure.   Venous: The inferior vena cava is normal in size with greater  than 50%  respiratory variability, suggesting right atrial pressure of 3 mmHg.   IAS/Shunts: No atrial level shunt detected by color flow Doppler.   Patient Profile     VERMON GRAYS is a 77 y.o. male with a hx of permanent Afib, HTN, OSA who is being seen 03/29/2022 for the evaluation of NSTEMI, hypotension, AKI at the request of Dr. Wynetta Emery. Patient had a fall and severe knee pain and saw ortho. CT showed no fracture but mild deep vein dilation. He developed worsening LE edema and was started on a diuretic by PCP about 4 days ago. On 1/2 he went to PCP and was hypotensive so sent to ED. He denied any symptoms of chest pain, dyspnea, dizziness, palpitations.   Assessment & Plan    Elevated troponin Regional wall motion abnormalities  Patient admitted following discovery of low BP at his PCP's office on 1/2. Noted to have elevated troponin 3628, 3084, 3058, 2688. Echocardiogram completed, noted with LVEF 50-55% (down from 60-65% in 2021) and concern for mild inferior mid to basal hypokinesis. ECG without acute ischemic changes.  Patient's presentation is rather atypical with no recent or current chest pain and no exertional tolerance changes. However, peak troponin of 3628 with regional wall  motion abnormalities and new cardiomyopathy, there is concern for Type I NSTEMI. Given normal RV size, no evidence of strain, PE less likely.  Creatine improved to 1.44 today, will discuss timing of LHC with Dr. Marlou Porch  Atrial fibrillation  Patient with afib first diagnosed around May of 2022. Given lack of symptoms, it appears that a rate control strategy has been pursued. Patient endorses compliance with home Eliquis '5mg'$ /Bisoprolol '5mg'$  and reports ventricular rates are usually 70s-80s per his Fitbit device.  Bisoprolol was held following hypotensive episode but BP now recovered. HR elevated this morning, suspect this is primarily due to significant right knee pain. Metoprolol Tartrate '25mg'$  BID ordered. Continue heparin infusion Although patient appears to have been without symptoms with afib, would consider EP evaluation for rhythm control strategies.  Lower extremity edema, bilateral  Patient's admission appears to be in part a result of diuretic induced hypotension after he was given Lasix for significant lower extremity edema/weeping.  Patient still has 3+ bilateral edema on exam today. Otherwise appears euvolemic. Given that he reports chronic leg swelling (though to a lesser degree), I wonder about lymphedema etiology. Would hold further diuretics at this time given recovering AKI and lack of improvement with lasix at home.  AKI  Patient with creatinine elevated to 2.71 on admission following 3 days of PO lasix '40mg'$ . Now improved to 1.44.       For questions or updates, please contact [place] Please consult [email or web address] for contact info under        Signed, Lily Kocher, PA-C  03/30/2022, 7:47 AM     NSTEMI or Type 2 MI in the setting of hypotension/AKI-troponin up to 3600. --Will proceed with right and left cardiac catheterization.  Has longstanding edema as well.  Will be helpful to know his right-sided heart pressures.  Risks and benefits have been  discussed including stroke heart attack death renal impairment bleeding, he is willing to proceed.  We will go ahead with this in a few hours since he had a very light breakfast this morning, just part of a banana, a bite of sausage and very little liquid. -Echocardiogram now reduced with EF 50 to 55% low normal with wall motion abnormality.  This is a signal for  potential ischemia.  He has not had any significant chest pain however.  If there is no evidence of significant coronary plaque rupture/erosion, I would label this as type 2 myocardial infarction.  AKI - Creatinine continues to improve.  Continue to monitor.  I think it is fair for him to go forward with catheterization today.  Overdiuresis took place in the setting of his lower extremity edema.   Lower extremity edema/knee pain - Appears to be fairly chronic from edema perspective.  He fell and hit his right knee in December.  Pain has been an ongoing issue off and on.  Atrial fibrillation persistent -We will go ahead and change the metoprolol to 25 every 6 hours to hopefully help with rate control.  He was on bisoprolol at home.  Heart rate is also elevated in the setting of knee pain currently.   Candee Furbish, MD

## 2022-03-30 NOTE — Progress Notes (Addendum)
Rounding Note    Patient Name: Arthur Wilson Date of Encounter: 03/30/2022  [place] Cardiologist: Rozann Lesches, MD   Subjective   Patient appears to be quite uncomfortable this morning and he confirms that his right knee is causing significant pain. Otherwise patient continues to deny sensation of chest pain, shortness of breath/orthopnea. He confirms that other than low BP that prompted his ED visit, he has also not noticed any dizziness. It sounds like this low BP occurred following 3 days of oral lasix '40mg'$  that patient was given by his PCP after he was noted with bilateral weeping edema in lower extremities at his orthopedist's office on 12/28. Of note, patient reports chronic lower extremity edema for years that necessitates daily use of compression stockings and regular elevation of his feet.  Inpatient Medications    Scheduled Meds:  insulin aspart  0-9 Units Subcutaneous TID WC   potassium chloride  30 mEq Oral Q4H   Continuous Infusions:  heparin 1,650 Units/hr (03/30/22 0650)   PRN Meds: fentaNYL (SUBLIMAZE) injection   Vital Signs    Vitals:   03/30/22 0400 03/30/22 0431 03/30/22 0511 03/30/22 0722  BP: 111/69 111/69 130/79 127/79  Pulse: (!) 120 (!) 114 (!) 107 (!) 109  Resp: '17 18 17 18  '$ Temp: 98.8 F (37.1 C)  98.8 F (37.1 C) 98.5 F (36.9 C)  TempSrc: Oral  Oral Oral  SpO2: 94% 95% 95% 94%  Weight:      Height:        Intake/Output Summary (Last 24 hours) at 03/30/2022 0747 Last data filed at 03/30/2022 0650 Gross per 24 hour  Intake 715.02 ml  Output 2500 ml  Net -1784.98 ml      03/30/2022    1:00 AM 03/30/2022   12:48 AM 03/29/2022    7:59 PM  Last 3 Weights  Weight (lbs) 243 lb 13.3 oz 243 lb 13.3 oz 246 lb 7.6 oz  Weight (kg) 110.6 kg 110.6 kg 111.8 kg      Telemetry    Atrial fibrillation with highly variable ventricular rates - Personally Reviewed  ECG    No new tracing. Repeat ordered.  Physical Exam   GEN: No  acute distress.   Neck: No JVD Cardiac: irregularly irregular, no murmurs, rubs, or gallops.  Respiratory: Clear to auscultation bilaterally. GI: Soft, nontender, non-distended  MS: 3+ bilateral lower extremity edema. Neuro:  Nonfocal  Psych: Normal affect   Labs    High Sensitivity Troponin:   Recent Labs  Lab 03/28/22 1816 03/28/22 2029 03/29/22 1125 03/30/22 0457  TROPONINIHS 3,628* 3,084* 3,058* 2,688*     Chemistry Recent Labs  Lab 03/28/22 1816 03/29/22 1125 03/30/22 0457  NA 139 139 139  K 3.6 3.3* 3.3*  CL 97* 97* 99  CO2 31 32 27  GLUCOSE 155* 158* 165*  BUN 90* 74* 50*  CREATININE 2.71* 1.68* 1.44*  CALCIUM 8.3* 8.1* 8.2*  MG  --   --  1.6*  GFRNONAA 24* 42* 51*  ANIONGAP '11 10 13    '$ Lipids No results for input(s): "CHOL", "TRIG", "HDL", "LABVLDL", "LDLCALC", "CHOLHDL" in the last 168 hours.  Hematology Recent Labs  Lab 03/28/22 1816  WBC 11.5*  RBC 4.34  HGB 13.3  HCT 40.1  MCV 92.4  MCH 30.6  MCHC 33.2  RDW 15.5  PLT 118*   Thyroid No results for input(s): "TSH", "FREET4" in the last 168 hours.  BNP Recent Labs  Lab 03/28/22 1816  BNP 531.0*    DDimer No results for input(s): "DDIMER" in the last 168 hours.   Radiology    US Venous Img Lower Bilateral (DVT)  Result Date: 03/29/2022 CLINICAL DATA:  Bilateral leg edema EXAM: BILATERAL LOWER EXTREMITY VENOUS DOPPLER ULTRASOUND TECHNIQUE: Gray-scale sonography with compression, as well as color and duplex ultrasound, were performed to evaluate the deep venous system(s) from the level of the common femoral vein through the popliteal and proximal calf veins. COMPARISON:  None Available. FINDINGS: VENOUS Normal compressibility of the common femoral, superficial femoral, and popliteal veins, as well as the visualized calf veins. Visualized portions of profunda femoral vein and great saphenous vein unremarkable. No filling defects to suggest DVT on grayscale or color Doppler imaging. Doppler  waveforms show normal direction of venous flow, normal respiratory plasticity and response to augmentation. OTHER None. Limitations: none IMPRESSION: Negative for DVT in the bilateral lower extremities Electronically Signed   By: Merilyn Baba M.D.   On: 03/29/2022 12:07   ECHOCARDIOGRAM COMPLETE  Result Date: 03/29/2022    ECHOCARDIOGRAM REPORT   Patient Name:   ALEXUS MICHAEL Date of Exam: 03/29/2022 Medical Rec #:  [id number]       Height:       70.0 in Accession #:    [id number]      Weight:       246.4 lb Date of Birth:  1946/01/08      BSA:          2.281 m Patient Age:    77 years        BP:           15/77 mmHg Patient Gender: M               HR:           85 bpm. Exam Location:  [facility] Procedure: 2D Echo, Cardiac Doppler and Color Doppler Indications:    CHF-Acute Diastolic Y86.57  History:        Patient has prior history of Echocardiogram examinations, most                 recent 11/05/2019. Previous Myocardial Infarction and CAD,                 Arrythmias:Atrial Fibrillation; Risk Factors:Hypertension and                 Diabetes. OSA (obstructive sleep apnea) (From Hx).  Sonographer:    Alvino Chapel RCS Referring Phys: [id number] OLADAPO ADEFESO IMPRESSIONS  1. Left ventricular ejection fraction, by estimation, is 50 to 55%. The left ventricle has low normal function. The left ventricle demonstrates regional wall motion abnormalities - cannot exclude mild inferior mid to basal hypokinesis. There is mild left ventricular hypertrophy. Left ventricular diastolic parameters are indeterminate.  2. Right ventricular systolic function is mildly reduced. The right ventricular size is normal. There is normal pulmonary artery systolic pressure. The estimated right ventricular systolic pressure is 52.8 mmHg.  3. Left atrial size was moderately dilated.  4. Right atrial size was mildly dilated.  5. The mitral valve is grossly normal. Mild to moderate mitral valve regurgitation.  6. The aortic valve is  tricuspid. There is mild calcification of the aortic valve. Aortic valve regurgitation is not visualized.  7. The inferior vena cava is normal in size with greater than 50% respiratory variability, suggesting right atrial pressure of 3 mmHg. Comparison(s): Prior images reviewed side by side. LVEF low normal at 50-55% with  possible mild, mid to basal inferior hypokinesis. Mild RV dysfunction with upper normal estimated RVSP. FINDINGS  Left Ventricle: Left ventricular ejection fraction, by estimation, is 50 to 55%. The left ventricle has low normal function. The left ventricle demonstrates regional wall motion abnormalities. The left ventricular internal cavity size was normal in size. There is mild left ventricular hypertrophy. Left ventricular diastolic function could not be evaluated due to atrial fibrillation. Left ventricular diastolic parameters are indeterminate. Right Ventricle: The right ventricular size is normal. No increase in right ventricular wall thickness. Right ventricular systolic function is mildly reduced. There is normal pulmonary artery systolic pressure. The tricuspid regurgitant velocity is 2.76 m/s, and with an assumed right atrial pressure of 3 mmHg, the estimated right ventricular systolic pressure is 85.2 mmHg. Left Atrium: Left atrial size was moderately dilated. Right Atrium: Right atrial size was mildly dilated. Pericardium: Trivial pericardial effusion is present. The pericardial effusion is posterior to the left ventricle. Mitral Valve: The mitral valve is grossly normal. Mild to moderate mitral valve regurgitation. Tricuspid Valve: The tricuspid valve is grossly normal. Tricuspid valve regurgitation is mild. Aortic Valve: The aortic valve is tricuspid. There is mild calcification of the aortic valve. Aortic valve regurgitation is not visualized. Pulmonic Valve: The pulmonic valve was grossly normal. Pulmonic valve regurgitation is trivial. Aorta: The aortic root is normal in size and  structure. Venous: The inferior vena cava is normal in size with greater than 50% respiratory variability, suggesting right atrial pressure of 3 mmHg. IAS/Shunts: No atrial level shunt detected by color flow Doppler.  LEFT VENTRICLE PLAX 2D LVIDd:         4.70 cm LVIDs:         2.90 cm LV PW:         1.10 cm LV IVS:        1.30 cm LVOT diam:     2.10 cm LV SV:         59 LV SV Index:   26 LVOT Area:     3.46 cm  RIGHT VENTRICLE TAPSE (M-mode): 1.5 cm LEFT ATRIUM              Index        RIGHT ATRIUM           Index LA diam:        4.20 cm  1.84 cm/m   RA Area:     25.00 cm LA Vol (A2C):   128.0 ml 56.12 ml/m  RA Volume:   76.10 ml  33.36 ml/m LA Vol (A4C):   104.0 ml 45.60 ml/m LA Biplane Vol: 118.0 ml 51.73 ml/m  AORTIC VALVE LVOT Vmax:   75.70 cm/s LVOT Vmean:  55.000 cm/s LVOT VTI:    0.169 m  AORTA Ao Root diam: 3.70 cm MITRAL VALVE                TRICUSPID VALVE MV Area (PHT): 4.39 cm     TR Peak grad:   30.5 mmHg MV Decel Time: 173 msec     TR Vmax:        276.00 cm/s MV E velocity: 115.00 cm/s                             SHUNTS                             Systemic VTI:  0.17 m                             Systemic Diam: 2.10 cm Rozann Lesches MD Electronically signed by Rozann Lesches MD Signature Date/Time: 03/29/2022/11:42:43 AM    Final    DG Chest 2 View  Result Date: 03/28/2022 CLINICAL DATA:  Sepsis suspected EXAM: CHEST - 2 VIEW COMPARISON:  01/14/2020 FINDINGS: Mild cardiomegaly. No focal pulmonary opacity. No pleural effusion or pneumothorax. No acute osseous abnormality. IMPRESSION: Cardiomegaly with no additional acute cardiopulmonary process. Electronically Signed   By: Merilyn Baba M.D.   On: 03/28/2022 18:51    Cardiac Studies   03/29/22 TTE  IMPRESSIONS     1. Left ventricular ejection fraction, by estimation, is 50 to 55%. The  left ventricle has low normal function. The left ventricle demonstrates  regional wall motion abnormalities - cannot exclude mild inferior mid to   basal hypokinesis. There is mild  left ventricular hypertrophy. Left ventricular diastolic parameters are  indeterminate.   2. Right ventricular systolic function is mildly reduced. The right  ventricular size is normal. There is normal pulmonary artery systolic  pressure. The estimated right ventricular systolic pressure is 24.0 mmHg.   3. Left atrial size was moderately dilated.   4. Right atrial size was mildly dilated.   5. The mitral valve is grossly normal. Mild to moderate mitral valve  regurgitation.   6. The aortic valve is tricuspid. There is mild calcification of the  aortic valve. Aortic valve regurgitation is not visualized.   7. The inferior vena cava is normal in size with greater than 50%  respiratory variability, suggesting right atrial pressure of 3 mmHg.   Comparison(s): Prior images reviewed side by side. LVEF low normal at  50-55% with possible mild, mid to basal inferior hypokinesis. Mild RV  dysfunction with upper normal estimated RVSP.   FINDINGS   Left Ventricle: Left ventricular ejection fraction, by estimation, is 50  to 55%. The left ventricle has low normal function. The left ventricle  demonstrates regional wall motion abnormalities. The left ventricular  internal cavity size was normal in  size. There is mild left ventricular hypertrophy. Left ventricular  diastolic function could not be evaluated due to atrial fibrillation. Left  ventricular diastolic parameters are indeterminate.   Right Ventricle: The right ventricular size is normal. No increase in  right ventricular wall thickness. Right ventricular systolic function is  mildly reduced. There is normal pulmonary artery systolic pressure. The  tricuspid regurgitant velocity is 2.76  m/s, and with an assumed right atrial pressure of 3 mmHg, the estimated  right ventricular systolic pressure is 97.3 mmHg.   Left Atrium: Left atrial size was moderately dilated.   Right Atrium: Right atrial size  was mildly dilated.   Pericardium: Trivial pericardial effusion is present. The pericardial  effusion is posterior to the left ventricle.   Mitral Valve: The mitral valve is grossly normal. Mild to moderate mitral  valve regurgitation.   Tricuspid Valve: The tricuspid valve is grossly normal. Tricuspid valve  regurgitation is mild.   Aortic Valve: The aortic valve is tricuspid. There is mild calcification  of the aortic valve. Aortic valve regurgitation is not visualized.   Pulmonic Valve: The pulmonic valve was grossly normal. Pulmonic valve  regurgitation is trivial.   Aorta: The aortic root is normal in size and structure.   Venous: The inferior vena cava is normal in size with greater  than 50%  respiratory variability, suggesting right atrial pressure of 3 mmHg.   IAS/Shunts: No atrial level shunt detected by color flow Doppler.   Patient Profile     TJ KITCHINGS is a 77 y.o. male with a hx of permanent Afib, HTN, OSA who is being seen 03/29/2022 for the evaluation of NSTEMI, hypotension, AKI at the request of Dr. Wynetta Emery. Patient had a fall and severe knee pain and saw ortho. CT showed no fracture but mild deep vein dilation. He developed worsening LE edema and was started on a diuretic by PCP about 4 days ago. On 1/2 he went to PCP and was hypotensive so sent to ED. He denied any symptoms of chest pain, dyspnea, dizziness, palpitations.   Assessment & Plan    Elevated troponin Regional wall motion abnormalities  Patient admitted following discovery of low BP at his PCP's office on 1/2. Noted to have elevated troponin 3628, 3084, 3058, 2688. Echocardiogram completed, noted with LVEF 50-55% (down from 60-65% in 2021) and concern for mild inferior mid to basal hypokinesis. ECG without acute ischemic changes.  Patient's presentation is rather atypical with no recent or current chest pain and no exertional tolerance changes. However, peak troponin of 3628 with regional wall  motion abnormalities and new cardiomyopathy, there is concern for Type I NSTEMI. Given normal RV size, no evidence of strain, PE less likely.  Creatine improved to 1.44 today, will discuss timing of LHC with Dr. Marlou Porch  Atrial fibrillation  Patient with afib first diagnosed around May of 2022. Given lack of symptoms, it appears that a rate control strategy has been pursued. Patient endorses compliance with home Eliquis '5mg'$ /Bisoprolol '5mg'$  and reports ventricular rates are usually 70s-80s per his Fitbit device.  Bisoprolol was held following hypotensive episode but BP now recovered. HR elevated this morning, suspect this is primarily due to significant right knee pain. Metoprolol Tartrate '25mg'$  BID ordered. Continue heparin infusion Although patient appears to have been without symptoms with afib, would consider EP evaluation for rhythm control strategies.  Lower extremity edema, bilateral  Patient's admission appears to be in part a result of diuretic induced hypotension after he was given Lasix for significant lower extremity edema/weeping.  Patient still has 3+ bilateral edema on exam today. Otherwise appears euvolemic. Given that he reports chronic leg swelling (though to a lesser degree), I wonder about lymphedema etiology. Would hold further diuretics at this time given recovering AKI and lack of improvement with lasix at home.  AKI  Patient with creatinine elevated to 2.71 on admission following 3 days of PO lasix '40mg'$ . Now improved to 1.44.       For questions or updates, please contact [place] Please consult [email or web address] for contact info under        Signed, Lily Kocher, PA-C  03/30/2022, 7:47 AM     NSTEMI or Type 2 MI in the setting of hypotension/AKI-troponin up to 3600. --Will proceed with right and left cardiac catheterization.  Has longstanding edema as well.  Will be helpful to know his right-sided heart pressures.  Risks and benefits have been  discussed including stroke heart attack death renal impairment bleeding, he is willing to proceed.  We will go ahead with this in a few hours since he had a very light breakfast this morning, just part of a banana, a bite of sausage and very little liquid. -Echocardiogram now reduced with EF 50 to 55% low normal with wall motion abnormality.  This is a signal for  potential ischemia.  He has not had any significant chest pain however.  If there is no evidence of significant coronary plaque rupture/erosion, I would label this as type 2 myocardial infarction.  AKI - Creatinine continues to improve.  Continue to monitor.  I think it is fair for him to go forward with catheterization today.  Overdiuresis took place in the setting of his lower extremity edema.   Lower extremity edema/knee pain - Appears to be fairly chronic from edema perspective.  He fell and hit his right knee in December.  Pain has been an ongoing issue off and on.  Atrial fibrillation persistent -We will go ahead and change the metoprolol to 25 every 6 hours to hopefully help with rate control.  He was on bisoprolol at home.  Heart rate is also elevated in the setting of knee pain currently.   Candee Furbish, MD

## 2022-03-30 NOTE — Progress Notes (Signed)
PROGRESS NOTE   Arthur Wilson  JHE:[id number] DOB: 1946-02-03 DOA: 75/04/2022 PCP: [name], MD   Chief Complaint  Patient presents with   Hypotension  Brief Admission History:  77/M with history of paroxysmal A-fib on Eliquis, type 2 diabetes mellitus, chronic leg edema presented to the ED with low blood pressure at his PCPs office. -Patient reports falling and injuring his right knee in early December, history of ongoing pain for several weeks, saw Dr. Reynaldo Minium last week, imaging was unremarkable, noted to have swelling and asked to see his primary care doctor then.  Went to his PCPs office on 1/2, was noted to be hypotensive and sent to the emergency room.  BP in the 80s, labs in the ED noted white count of 11.5, creatinine 2.7, troponin 3628, lactate 3.1, BNP 531 chest x-ray noted cardiomegaly, otherwise unremarkable    Subjective: Denies chest pain or dyspnea, continues to complain of right knee pain  Assessment and Plan:  NSTEMI  -Continue IV heparin, troponin peaked at 3600 -Creatinine improving, plan for left heart cath today -Echo with a EF of 50-55%, new wall motion abnormalities, mildly reduced RV -Continue metoprolol, add statin  Paroxysmal atrial fibrillation -Could be contributing to his CHF as well -Bisoprolol on hold, started on metoprolol now -Continue IV heparin, Eliquis on hold  Significant edema Volume overload, suspect diastolic CHF,?  RV failure -2D echo with EF 50-55%, mildly reduced RV function -Will need diuretics, on low-dose HCTZ at baseline -Dopplers negative for DVT -Start IV Lasix tomorrow, check albumin, no history of liver disease  Right knee pain -Recent fall, saw orthopedics last week, CT right knee 12/27, No evidence of acute fracture or dislocation.,  Mild tricompartmental degenerative changes noted and small knee joint effusion -Supportive care, add Voltaren gel -Physical therapy  AKI (presumed) -Likely ATN in the setting of  hypotension, improving, baseline unknown  Lactic Acidosis -resolved after hydration given in ED  Type 2 DM -continue SSI coverage and CBG monitoring  Thrombocytopenia - recheck CBC in AM  DVT prophylaxis: IV heparin infusion Code Status: Full  Family Communication: No family at bedside Disposition: Home pending above workup  Remains inpatient appropriate because: IV heparin infusion required   Consultants:  Cardiology   Antimicrobials:  N/a   Objective: Vitals:   03/30/22 0400 03/30/22 0431 03/30/22 0511 03/30/22 0722  BP: 111/69 111/69 130/79 127/79  Pulse: (!) 120 (!) 114 (!) 107 (!) 109  Resp: '17 18 17 18  '$ Temp: 98.8 F (37.1 C)  98.8 F (37.1 C) 98.5 F (36.9 C)  TempSrc: Oral  Oral Oral  SpO2: 94% 95% 95% 94%  Weight:      Height:        Intake/Output Summary (Last 24 hours) at 03/30/2022 1007 Last data filed at 03/30/2022 0815 Gross per 24 hour  Intake 955.02 ml  Output 2500 ml  Net -1544.98 ml    Examination:  General exam: Obese male laying in bed, AAOx3 CVS: S1-S2, irregularly irregular rhythm Lungs: Decreased breath sounds at the bases otherwise clear Abdomen: Soft, nontender, bowel sounds present Extremities: 2+ edema, mild tenderness with full range of motion of right knee Skin: No rashes, lesions or ulcers. Psychiatry: Anxious  Data Reviewed: I have personally reviewed following labs and imaging studies  CBC: Recent Labs  Lab 03/28/22 1816  WBC 11.5*  HGB 13.3  HCT 40.1  MCV 92.4  PLT 118*    Basic Metabolic Panel: Recent Labs  Lab 03/28/22 1816 03/29/22 1125 03/30/22  0457  NA 139 139 139  K 3.6 3.3* 3.3*  CL 97* 97* 99  CO2 31 32 27  GLUCOSE 155* 158* 165*  BUN 90* 74* 50*  CREATININE 2.71* 1.68* 1.44*  CALCIUM 8.3* 8.1* 8.2*  MG  --   --  1.6*    CBG: Recent Labs  Lab 03/29/22 1218 03/29/22 1641 03/29/22 2059 03/30/22 0617 03/30/22 0725  GLUCAP 154* 182* 224* 174* 189*    Recent Results (from the past 240  hour(s))  Culture, blood (Routine x 2)     Status: None (Preliminary result)   Collection Time: 03/28/22  6:16 PM   Specimen: BLOOD  Result Value Ref Range Status   Specimen Description   Final    BLOOD LEFT ANTECUBITAL Performed at [facility], [address], [place], [place] [address]    Special Requests   Final    BOTTLES DRAWN AEROBIC AND ANAEROBIC Blood Culture results may not be optimal due to an excessive volume of blood received in culture bottles Performed at [facility], [address], [place], [place] [address]    Culture   Final    NO GROWTH < 24 HOURS Performed at [facility], [place] [address] [place], [place] [address]    Report Status PENDING  Incomplete  Culture, blood (Routine x 2)     Status: None (Preliminary result)   Collection Time: 03/28/22  6:16 PM   Specimen: BLOOD  Result Value Ref Range Status   Specimen Description   Final    BLOOD BLOOD RIGHT ARM Performed at [facility], [address], [place], [place] [address]    Special Requests   Final    BOTTLES DRAWN AEROBIC AND ANAEROBIC Blood Culture results may not be optimal due to an excessive volume of blood received in culture bottles Performed at [facility], [address], [place], [place] [address]    Culture   Final    NO GROWTH < 24 HOURS Performed at [facility] [address] [place], [place] [address]    Report Status PENDING  Incomplete     Radiology Studies: US Venous Img Lower Bilateral (DVT)  Result Date: 03/29/2022 CLINICAL DATA:  Bilateral leg edema EXAM: BILATERAL LOWER EXTREMITY VENOUS DOPPLER ULTRASOUND TECHNIQUE: Gray-scale sonography with compression, as well as color and duplex ultrasound, were performed to evaluate the deep venous system(s) from the level of the common femoral vein through the popliteal and proximal calf veins. COMPARISON:  None Available. FINDINGS: VENOUS Normal compressibility of the common femoral, superficial femoral, and popliteal veins, as  well as the visualized calf veins. Visualized portions of profunda femoral vein and great saphenous vein unremarkable. No filling defects to suggest DVT on grayscale or color Doppler imaging. Doppler waveforms show normal direction of venous flow, normal respiratory plasticity and response to augmentation. OTHER None. Limitations: none IMPRESSION: Negative for DVT in the bilateral lower extremities Electronically Signed   By: Merilyn Baba M.D.   On: 03/29/2022 12:07   ECHOCARDIOGRAM COMPLETE  Result Date: 03/29/2022    ECHOCARDIOGRAM REPORT   Patient Name:   Arthur Wilson Date of Exam: 03/29/2022 Medical Rec #:  [id number]       Height:       70.0 in Accession #:    [id number]      Weight:       246.4 lb Date of Birth:  May 17, 1945      BSA:  2.281 m Patient Age:    26 years        BP:           15/77 mmHg Patient Gender: M               HR:           85 bpm. Exam Location:  [facility] Procedure: 2D Echo, Cardiac Doppler and Color Doppler Indications:    CHF-Acute Diastolic W23.76  History:        Patient has prior history of Echocardiogram examinations, most                 recent 11/05/2019. Previous Myocardial Infarction and CAD,                 Arrythmias:Atrial Fibrillation; Risk Factors:Hypertension and                 Diabetes. OSA (obstructive sleep apnea) (From Hx).  Sonographer:    Alvino Chapel RCS Referring Phys: [id number] OLADAPO ADEFESO IMPRESSIONS  1. Left ventricular ejection fraction, by estimation, is 50 to 55%. The left ventricle has low normal function. The left ventricle demonstrates regional wall motion abnormalities - cannot exclude mild inferior mid to basal hypokinesis. There is mild left ventricular hypertrophy. Left ventricular diastolic parameters are indeterminate.  2. Right ventricular systolic function is mildly reduced. The right ventricular size is normal. There is normal pulmonary artery systolic pressure. The estimated right ventricular systolic pressure is 61.6 mmHg.  3.  Left atrial size was moderately dilated.  4. Right atrial size was mildly dilated.  5. The mitral valve is grossly normal. Mild to moderate mitral valve regurgitation.  6. The aortic valve is tricuspid. There is mild calcification of the aortic valve. Aortic valve regurgitation is not visualized.  7. The inferior vena cava is normal in size with greater than 50% respiratory variability, suggesting right atrial pressure of 3 mmHg. Comparison(s): Prior images reviewed side by side. LVEF low normal at 50-55% with possible mild, mid to basal inferior hypokinesis. Mild RV dysfunction with upper normal estimated RVSP. FINDINGS  Left Ventricle: Left ventricular ejection fraction, by estimation, is 50 to 55%. The left ventricle has low normal function. The left ventricle demonstrates regional wall motion abnormalities. The left ventricular internal cavity size was normal in size. There is mild left ventricular hypertrophy. Left ventricular diastolic function could not be evaluated due to atrial fibrillation. Left ventricular diastolic parameters are indeterminate. Right Ventricle: The right ventricular size is normal. No increase in right ventricular wall thickness. Right ventricular systolic function is mildly reduced. There is normal pulmonary artery systolic pressure. The tricuspid regurgitant velocity is 2.76 m/s, and with an assumed right atrial pressure of 3 mmHg, the estimated right ventricular systolic pressure is 07.3 mmHg. Left Atrium: Left atrial size was moderately dilated. Right Atrium: Right atrial size was mildly dilated. Pericardium: Trivial pericardial effusion is present. The pericardial effusion is posterior to the left ventricle. Mitral Valve: The mitral valve is grossly normal. Mild to moderate mitral valve regurgitation. Tricuspid Valve: The tricuspid valve is grossly normal. Tricuspid valve regurgitation is mild. Aortic Valve: The aortic valve is tricuspid. There is mild calcification of the aortic  valve. Aortic valve regurgitation is not visualized. Pulmonic Valve: The pulmonic valve was grossly normal. Pulmonic valve regurgitation is trivial. Aorta: The aortic root is normal in size and structure. Venous: The inferior vena cava is normal in size with greater than 50% respiratory variability, suggesting right atrial pressure of  3 mmHg. IAS/Shunts: No atrial level shunt detected by color flow Doppler.  LEFT VENTRICLE PLAX 2D LVIDd:         4.70 cm LVIDs:         2.90 cm LV PW:         1.10 cm LV IVS:        1.30 cm LVOT diam:     2.10 cm LV SV:         59 LV SV Index:   26 LVOT Area:     3.46 cm  RIGHT VENTRICLE TAPSE (M-mode): 1.5 cm LEFT ATRIUM              Index        RIGHT ATRIUM           Index LA diam:        4.20 cm  1.84 cm/m   RA Area:     25.00 cm LA Vol (A2C):   128.0 ml 56.12 ml/m  RA Volume:   76.10 ml  33.36 ml/m LA Vol (A4C):   104.0 ml 45.60 ml/m LA Biplane Vol: 118.0 ml 51.73 ml/m  AORTIC VALVE LVOT Vmax:   75.70 cm/s LVOT Vmean:  55.000 cm/s LVOT VTI:    0.169 m  AORTA Ao Root diam: 3.70 cm MITRAL VALVE                TRICUSPID VALVE MV Area (PHT): 4.39 cm     TR Peak grad:   30.5 mmHg MV Decel Time: 173 msec     TR Vmax:        276.00 cm/s MV E velocity: 115.00 cm/s                             SHUNTS                             Systemic VTI:  0.17 m                             Systemic Diam: 2.10 cm Rozann Lesches MD Electronically signed by Rozann Lesches MD Signature Date/Time: 03/29/2022/11:42:43 AM    Final    DG Chest 2 View  Result Date: 03/28/2022 CLINICAL DATA:  Sepsis suspected EXAM: CHEST - 2 VIEW COMPARISON:  01/14/2020 FINDINGS: Mild cardiomegaly. No focal pulmonary opacity. No pleural effusion or pneumothorax. No acute osseous abnormality. IMPRESSION: Cardiomegaly with no additional acute cardiopulmonary process. Electronically Signed   By: Merilyn Baba M.D.   On: 03/28/2022 18:51    Scheduled Meds:  diclofenac Sodium  2 g Topical QID   insulin aspart  0-9 Units  Subcutaneous TID WC   metoprolol tartrate  25 mg Oral Q6H   Continuous Infusions:  sodium chloride Stopped (03/30/22 1001)   Followed by   sodium chloride     heparin 1,650 Units/hr (03/30/22 0650)     LOS: 2 days   Time spent: 38 mins  Domenic Polite, MD   03/30/2022, 10:07 AM

## 2022-03-30 NOTE — Progress Notes (Signed)
Heart Failure Nurse Navigator Progress Note  PCP: [name], MD PCP-Cardiologist: Domenic Polite Admission Diagnosis: NSTEMI, Hypotension due to hypovolemia, AKI, Atrial fibrillation with rapid ventricular response.  Admitted from: PCP- [facility]  Presentation:   Arthur Wilson presented after being seen by his PCP and found to be hypotensive 83/64, HR 120's with bilateral lower extremity edema ( more then usual), PCP started him on diuretics 3 days prior of admission. ) weakness. Painful right knee since a fall in December 2023. BNP 531, Troponin 3,628, BMI 34.99. EKG : showed Atrial fibrillation, consider anterior infarct. IV heparin started, CXR : cardiomegaly, Plan for R/ L heart cath 03/30/2022.   Patient and wife ( at bedside) wee educated on the sign and symptoms of heart failure, daily weights, when to call his doctor or go to the ED, Diet/ fluid restrictions, taking all his medications as prescribed, and attending all medical appointments. Both patient and wife verbalized their understanding of the education. A hospital HF TOC appointment was scheduled for 04/12/2022 @ 3 pm.   ECHO/ LVEF: 50-55%  Clinical Course:  Past Medical History:  Diagnosis Date   Atrial fibrillation (HCC)    BPH (benign prostatic hyperplasia)    Cervical spondylosis without myelopathy    Essential hypertension    Impaired glucose tolerance    OSA on CPAP    Testicular hypofunction      Social History   Socioeconomic History   Marital status: Married    Spouse name: Manuela Schwartz   Number of children: 3   Years of education: BS-Pharm   Highest education level: Not on file  Occupational History   Occupation: pharmacist     Comment: Agricultural engineer  Tobacco Use   Smoking status: Never   Smokeless tobacco: Never  Vaping Use   Vaping Use: Never used  Substance and Sexual Activity   Alcohol use: No   Drug use: No   Sexual activity: Not on file  Other Topics Concern   Not on file   Social History Narrative   Lives with wife   Patient is right handed.   Patient drinks caffeine a few times a week.   Social Determinants of Health   Financial Resource Strain: Not on file  Food Insecurity: No Food Insecurity (03/29/2022)   Hunger Vital Sign    Worried About Running Out of Food in the Last Year: Never true    Ran Out of Food in the Last Year: Never true  Transportation Needs: No Transportation Needs (03/29/2022)   PRAPARE - Hydrologist (Medical): No    Lack of Transportation (Non-Medical): No  Physical Activity: Not on file  Stress: Not on file  Social Connections: Not on file   Education Assessment and Provision:  Detailed education and instructions provided on heart failure disease management including the following:  Signs and symptoms of Heart Failure When to call the physician Importance of daily weights Low sodium diet Fluid restriction Medication management Anticipated future follow-up appointments  Patient education given on each of the above topics.  Patient acknowledges understanding via teach back method and acceptance of all instructions.  Education Materials:  "Living Better With Heart Failure" Booklet, HF zone tool, & Daily Weight Tracker Tool.  Patient has scale at home: yes Patient has pill box at home: NA    High Risk Criteria for Readmission and/or Poor Patient Outcomes: Heart failure hospital admissions (last 6 months): 0  No Show rate: 0 Difficult social  situation: No Demonstrates medication adherence: Yes Primary Language: English Literacy level: Reading , writing, and comprehension  Barriers of Care:   New HF education Diet/ fluid restrictions ( salt)  Considerations/Referrals:   Referral made to Heart Failure Pharmacist Stewardship: Yes Referral made to Heart Failure CSW/NCM TOC: No Referral made to Heart & Vascular TOC clinic: Yes, 04/12/2022 @ 3 pm  Items for Follow-up on DC/TOC: Continued HF  education  Diet/ fluid restrictions ( salt)   Earnestine Leys, BSN, RN Heart Failure Transport planner Only

## 2022-03-30 NOTE — Progress Notes (Signed)
ANTICOAGULATION CONSULT NOTE  Pharmacy Consult for Heparin Indication: chest pain/ACS  No Known Allergies  Patient Measurements: Height: '5\' 10"'$  (177.8 cm) Weight: 110.6 kg (243 lb 13.3 oz) IBW/kg (Calculated) : 73  Heparin Dosing Weight: 97 kg  Vital Signs: Temp: 98.7 F (37.1 C) (01/04 2015) Temp Source: Oral (01/04 2015) BP: 144/89 (01/04 2031) Pulse Rate: 98 (01/04 2015)  Labs: Recent Labs    03/28/22 1816 03/28/22 2029 03/29/22 1125 03/29/22 1911 03/30/22 0457 03/30/22 0935 03/30/22 1520 03/30/22 1524 03/30/22 1526  HGB 13.3  --   --   --   --   --  11.9* 12.2* 12.2*  HCT 40.1  --   --   --   --   --  35.0* 36.0* 36.0*  PLT 118*  --   --   --   --   --   --   --   --   APTT  --   --  85* 56*  --  93*  --   --   --   LABPROT 16.7*  --   --   --   --   --   --   --   --   INR 1.4*  --   --   --   --   --   --   --   --   HEPARINUNFRC  --   --   --   --   --  >1.10*  --   --   --   CREATININE 2.71*  --  1.68*  --  1.44*  --   --   --   --   [id number] 0,712* 3,084* 3,058*  --  2,688*  --   --   --   --      Estimated Creatinine Clearance: 56 mL/min (A) (by C-G formula based on SCr of 1.44 mg/dL (H)).   Assessment: 77 YO m with medical history significant for atrial fibrillation on apixaban PTA who is admitted for elevated troponins. Pharmacy consulted to manage heparin for ACS. Last apixaban dose 01/02 AM per medication history. Will adjust heparin based on aPTT until correlation with heparin levels.   Patient s/p heart cath today - d/w cardiology and will resume heparin 2 hours post TR band removal. Band removed ~10pm. Heparin previously therapeutic at 1650 units/hr.   Goal of Therapy:  Heparin level 0.3-0.7 units/ml aPTT 66-102 seconds Monitor platelets by anticoagulation protocol: Yes   Plan:  Resume heparin infusion at 1650 units/hr at midnight Daily aPTT and heparin level Continue to monitor H&H and platelets  Thank you for allowing pharmacy to be  a part of this patient's care.  Dimple Nanas, PharmD, BCPS 03/30/2022 10:01 PM

## 2022-03-30 NOTE — Progress Notes (Signed)
Critical lab troponin of 2688 given to Charge RN. Cards page to notify. Primary RN notified.

## 2022-03-30 NOTE — Plan of Care (Signed)

## 2022-03-31 ENCOUNTER — Other Ambulatory Visit (HOSPITAL_COMMUNITY): Payer: Self-pay

## 2022-03-31 ENCOUNTER — Encounter (HOSPITAL_COMMUNITY): Payer: Self-pay | Admitting: Internal Medicine

## 2022-03-31 ENCOUNTER — Inpatient Hospital Stay (HOSPITAL_COMMUNITY)
Admission: EM | Disposition: A | Discharge status: Home Health | Payer: Self-pay | Source: Home / Self Care | Attending: Internal Medicine

## 2022-03-31 DIAGNOSIS — I214 Non-ST elevation (NSTEMI) myocardial infarction: Secondary | ICD-10-CM | POA: Diagnosis not present

## 2022-03-31 HISTORY — PX: CORONARY STENT INTERVENTION: CATH118234

## 2022-03-31 HISTORY — PX: CORONARY ATHERECTOMY: CATH118238

## 2022-03-31 LAB — GLUCOSE, CAPILLARY
Glucose-Capillary: 174 mg/dL — ABNORMAL HIGH (ref 70–99)
Glucose-Capillary: 149 mg/dL — ABNORMAL HIGH (ref 70–99)
Glucose-Capillary: 165 mg/dL — ABNORMAL HIGH (ref 70–99)
Glucose-Capillary: 158 mg/dL — ABNORMAL HIGH (ref 70–99)

## 2022-03-31 LAB — COMPREHENSIVE METABOLIC PANEL
ALT: 75 U/L — ABNORMAL HIGH (ref 0–44)
AST: 33 U/L (ref 15–41)
Albumin: 2.2 g/dL — ABNORMAL LOW (ref 3.5–5.0)
Alkaline Phosphatase: 32 U/L — ABNORMAL LOW (ref 38–126)
Anion gap: 8 (ref 5–15)
BUN: 31 mg/dL — ABNORMAL HIGH (ref 8–23)
CO2: 24 mmol/L (ref 22–32)
Calcium: 8.1 mg/dL — ABNORMAL LOW (ref 8.9–10.3)
Chloride: 105 mmol/L (ref 98–111)
Creatinine, Ser: 1.16 mg/dL (ref 0.61–1.24)
GFR, Estimated: >60 mL/min (ref >60)
Glucose, Bld: 168 mg/dL — ABNORMAL HIGH (ref 70–99)
Potassium: 4.3 mmol/L (ref 3.5–5.1)
Sodium: 137 mmol/L (ref 135–145)
Total Bilirubin: 1.7 mg/dL — ABNORMAL HIGH (ref 0.3–1.2)
Total Protein: 4.6 g/dL — ABNORMAL LOW (ref 6.5–8.1)

## 2022-03-31 LAB — POCT ACTIVATED CLOTTING TIME
Activated Clotting Time: 358 seconds
Activated Clotting Time: 277 seconds
Activated Clotting Time: 260 seconds

## 2022-03-31 LAB — CBC
HCT: 35.4 % — ABNORMAL LOW (ref 39.0–52.0)
Hemoglobin: 12.3 g/dL — ABNORMAL LOW (ref 13.0–17.0)
MCH: 31.2 pg (ref 26.0–34.0)
MCHC: 34.7 g/dL (ref 30.0–36.0)
MCV: 89.8 fL (ref 80.0–100.0)
Platelets: 100 10*3/uL — ABNORMAL LOW (ref 150–400)
RBC: 3.94 MIL/uL — ABNORMAL LOW (ref 4.22–5.81)
RDW: 15.4 % (ref 11.5–15.5)
WBC: 6.3 10*3/uL (ref 4.0–10.5)
nRBC: 0 % (ref 0.0–0.2)

## 2022-03-31 LAB — APTT: aPTT: 126 seconds — ABNORMAL HIGH (ref 24–36)

## 2022-03-31 LAB — HEPARIN LEVEL (UNFRACTIONATED): Heparin Unfractionated: 0.78 IU/mL — ABNORMAL HIGH (ref 0.30–0.70)

## 2022-03-31 SURGERY — CORONARY STENT INTERVENTION
Anesthesia: LOCAL

## 2022-03-31 MED ORDER — HEPARIN SODIUM (PORCINE) 1000 UNIT/ML IJ SOLN
INTRAMUSCULAR | Status: AC
  Filled 2022-03-31: qty 10

## 2022-03-31 MED ORDER — ONDANSETRON HCL 4 MG/2ML IJ SOLN: 4.0000 mg | Freq: Four times a day (QID) | INTRAMUSCULAR | Status: DC | PRN

## 2022-03-31 MED ORDER — IOHEXOL 350 MG/ML SOLN
INTRAVENOUS | Status: DC | PRN
  Administered 2022-03-31: 130 mL

## 2022-03-31 MED ORDER — LIDOCAINE HCL (PF) 1 % IJ SOLN
INTRAMUSCULAR | Status: AC
  Filled 2022-03-31: qty 30

## 2022-03-31 MED ORDER — NITROGLYCERIN 1 MG/10 ML FOR IR/CATH LAB
INTRA_ARTERIAL | Status: AC
  Filled 2022-03-31: qty 10

## 2022-03-31 MED ORDER — HYDRALAZINE HCL 20 MG/ML IJ SOLN: 10.0000 mg | INTRAMUSCULAR | Status: AC | PRN

## 2022-03-31 MED ORDER — LABETALOL HCL 5 MG/ML IV SOLN: 10.0000 mg | INTRAVENOUS | Status: AC | PRN

## 2022-03-31 MED ORDER — HEPARIN SODIUM (PORCINE) 1000 UNIT/ML IJ SOLN
INTRAMUSCULAR | Status: DC | PRN
  Administered 2022-03-31: 10000 [IU] via INTRAVENOUS
  Administered 2022-03-31 (×2): 1000 [IU] via INTRAVENOUS

## 2022-03-31 MED ORDER — ACETAMINOPHEN 325 MG PO TABS: 650.0000 mg | ORAL_TABLET | ORAL | Status: DC | PRN

## 2022-03-31 MED ORDER — SODIUM CHLORIDE 0.9 % IV SOLN: 250.0000 mL | INTRAVENOUS | Status: DC | PRN

## 2022-03-31 MED ORDER — CLOPIDOGREL BISULFATE 75 MG PO TABS: 75.0000 mg | ORAL_TABLET | Freq: Every day | ORAL | Status: DC

## 2022-03-31 MED ORDER — SODIUM CHLORIDE 0.9 % IV SOLN: INTRAVENOUS | Status: AC

## 2022-03-31 MED ORDER — MIDAZOLAM HCL 2 MG/2ML IJ SOLN
INTRAMUSCULAR | Status: AC
  Filled 2022-03-31: qty 2

## 2022-03-31 MED ORDER — VERAPAMIL HCL 2.5 MG/ML IV SOLN: INTRAVENOUS | Status: DC | PRN

## 2022-03-31 MED ORDER — LIDOCAINE HCL (PF) 1 % IJ SOLN
INTRAMUSCULAR | Status: DC | PRN
  Administered 2022-03-31: 2 mL

## 2022-03-31 MED ORDER — BOOST / RESOURCE BREEZE PO LIQD CUSTOM
1.0000 | Freq: Three times a day (TID) | ORAL | Status: DC
  Administered 2022-04-01 – 2022-04-02 (×4): 1 via ORAL

## 2022-03-31 MED ORDER — MIDAZOLAM HCL 2 MG/2ML IJ SOLN
INTRAMUSCULAR | Status: DC | PRN
  Administered 2022-03-31: 1 mg via INTRAVENOUS

## 2022-03-31 MED ORDER — MELATONIN 5 MG PO TABS
10.0000 mg | ORAL_TABLET | Freq: Every evening | ORAL | Status: DC | PRN
  Administered 2022-04-02 – 2022-04-03 (×3): 10 mg via ORAL
  Filled 2022-03-31 (×3): qty 2

## 2022-03-31 MED ORDER — SODIUM CHLORIDE 0.9% FLUSH
3.0000 mL | INTRAVENOUS | Status: DC | PRN
  Administered 2022-03-31: 3 mL via INTRAVENOUS

## 2022-03-31 MED ORDER — ASPIRIN 81 MG PO CHEW: 81.0000 mg | CHEWABLE_TABLET | Freq: Every day | ORAL | Status: DC

## 2022-03-31 MED ORDER — HEPARIN (PORCINE) IN NACL 1000-0.9 UT/500ML-% IV SOLN
INTRAVENOUS | Status: AC
  Filled 2022-03-31: qty 500

## 2022-03-31 MED ORDER — HEPARIN (PORCINE) IN NACL 1000-0.9 UT/500ML-% IV SOLN
INTRAVENOUS | Status: DC | PRN
  Administered 2022-03-31 (×2): 500 mL

## 2022-03-31 MED ORDER — VERAPAMIL HCL 2.5 MG/ML IV SOLN
INTRAVENOUS | Status: AC
  Filled 2022-03-31: qty 2

## 2022-03-31 MED ORDER — METOPROLOL TARTRATE 50 MG PO TABS
50.0000 mg | ORAL_TABLET | Freq: Four times a day (QID) | ORAL | Status: DC
  Administered 2022-04-01: 50 mg via ORAL
  Filled 2022-03-31 (×3): qty 1

## 2022-03-31 MED ORDER — FENTANYL CITRATE (PF) 100 MCG/2ML IJ SOLN
INTRAMUSCULAR | Status: DC | PRN
  Administered 2022-03-31: 25 ug via INTRAVENOUS

## 2022-03-31 MED ORDER — SODIUM CHLORIDE 0.9% FLUSH
3.0000 mL | Freq: Two times a day (BID) | INTRAVENOUS | Status: DC
  Administered 2022-03-31 – 2022-04-03 (×6): 3 mL via INTRAVENOUS

## 2022-03-31 MED ORDER — FENTANYL CITRATE (PF) 100 MCG/2ML IJ SOLN
INTRAMUSCULAR | Status: AC
  Filled 2022-03-31: qty 2

## 2022-03-31 MED ORDER — APIXABAN 5 MG PO TABS
5.0000 mg | ORAL_TABLET | Freq: Two times a day (BID) | ORAL | Status: DC
  Administered 2022-04-01 – 2022-04-04 (×7): 5 mg via ORAL
  Filled 2022-03-31 (×7): qty 1

## 2022-03-31 SURGICAL SUPPLY — 30 items
BALLN EMERGE MR 2.5X15 (BALLOONS) ×1
BALLN EMERGE MR 3.0X15 (BALLOONS) ×1
BALLN ~~LOC~~ EMERGE MR 4.0X20 (BALLOONS) ×1
BALLOON EMERGE MR 2.5X15 (BALLOONS) IMPLANT
BALLOON EMERGE MR 3.0X15 (BALLOONS) IMPLANT
BALLOON ~~LOC~~ EMERGE MR 4.0X20 (BALLOONS) IMPLANT
CATH DIAG 6FR PIGTAIL ANGLED (CATHETERS) IMPLANT
CATH DRAGONFLY OPSTAR (CATHETERS) IMPLANT
CATH LAUNCHER 6FR EBU3.5 (CATHETERS) IMPLANT
CATH TELEPORT (CATHETERS) IMPLANT
CATH TELESCOPE 6F GEC (CATHETERS) IMPLANT
CROWN DIAMONDBACK CLASSIC 1.25 (BURR) IMPLANT
DEVICE RAD COMP TR BAND LRG (VASCULAR PRODUCTS) IMPLANT
GLIDESHEATH SLEND SS 6F .021 (SHEATH) IMPLANT
GUIDEWIRE INQWIRE 1.5J.035X260 (WIRE) IMPLANT
GUIDEWIRE VAS SION BLUE 190 (WIRE) IMPLANT
INQWIRE 1.5J .035X260CM (WIRE) ×1
KIT ENCORE 26 ADVANTAGE (KITS) IMPLANT
KIT HEART LEFT (KITS) ×1 IMPLANT
LUBRICANT VIPERSLIDE CORONARY (MISCELLANEOUS) IMPLANT
MAT PREVALON FULL STRYKER (MISCELLANEOUS) IMPLANT
PACK CARDIAC CATHETERIZATION (CUSTOM PROCEDURE TRAY) ×1 IMPLANT
SHEATH PROBE COVER 6X72 (BAG) IMPLANT
STENT SYNERGY XD 4.0X32 (Permanent Stent) IMPLANT
SYNERGY XD 4.0X32 (Permanent Stent) ×1 IMPLANT
TRANSDUCER W/STOPCOCK (MISCELLANEOUS) ×1 IMPLANT
TUBING CIL FLEX 10 FLL-RA (TUBING) ×1 IMPLANT
VALVE GUARDIAN II ~~LOC~~ HEMO (MISCELLANEOUS) IMPLANT
WIRE ASAHI PROWATER 300CM (WIRE) IMPLANT
WIRE VIPERWIRE COR FLEX .012 (WIRE) IMPLANT

## 2022-03-31 NOTE — Interval H&P Note (Signed)
History and Physical Interval Note:  03/31/2022 9:14 AM  Arthur Wilson  has presented today for surgery, with the diagnosis of CAD.  The various methods of treatment have been discussed with the patient and family. After consideration of risks, benefits and other options for treatment, the patient has consented to  Procedure(s): CORONARY STENT INTERVENTION (N/A) as a surgical intervention.  The patient's history has been reviewed, patient examined, no change in status, stable for surgery.  I have reviewed the patient's chart and labs.  Questions were answered to the patient's satisfaction.     Early Osmond

## 2022-03-31 NOTE — Progress Notes (Signed)
PROGRESS NOTE   TIERNAN SUTO  SWH:[id number] DOB: [birth year] DOA: 03/28/2022 PCP: [name], MD   Chief Complaint  Patient presents with   Hypotension  Brief Admission History:  76/M with history of paroxysmal A-fib on Eliquis, type 2 diabetes mellitus, chronic leg edema presented to the ED with low blood pressure at his PCPs office. -Patient reports falling and injuring his right knee in early December, history of ongoing pain for several weeks, saw Dr. Reynaldo Minium last week, imaging was unremarkable, noted to have swelling and asked to see his primary care doctor then.  Went to his PCPs office on 1/2, was noted to be hypotensive and sent to the emergency room.  BP in the 80s, labs in the ED noted white count of 11.5, creatinine 2.7, troponin 3628, lactate 3.1, BNP 531 chest x-ray noted cardiomegaly, otherwise unremarkable -Echo with EF 50-55% with new wall motion abnormalities, mildly reduced RV -LHC 1/4 high-grade disease of proximal LAD and obtuse marginal with RFR positive ostial and proximal to mid right coronary artery disease, PCI of the right coronary artery was pursued with 2 overlapping drug-eluting stents placed. Due to dye expenditure, PCI of the proximal LAD and left circumflex will be staged depending on renal function. -   Subjective: Feels okay overall, denies chest pain or dyspnea, right knee improving, swelling is a little better  Assessment and Plan:  NSTEMI  -Continue IV heparin, troponin peaked at 3600 -Creatinine improved, underwent stenting of RCA with 2 overlapping stents yesterday, started on Plavix now -Plan for staged PCI of proximal LAD and left circumflex today -Echo with a EF of 50-55%, new wall motion abnormalities, mildly reduced RV -Continue metoprolol, statin  Paroxysmal atrial fibrillation -Could be contributing to his CHF as well -Bisoprolol on hold, started on metoprolol now -Continue IV heparin, Eliquis on hold -May end up needing DCCV  Acute  on chronic diastolic CHF Hypoalbuminemia, third spacing -2D echo with EF 50-55%, mildly reduced RV function -Right heart cath with preserved cardiac output, wedge elevated at 18 -Resume diuretics after cath tomorrow, he is 3.1 L negative -Dopplers negative for DVT -Increase protein supplements, albumin is low, no history of liver disease  Right knee pain/contusion -Recent fall, saw orthopedics last week, CT right knee 12/27, No evidence of acute fracture or dislocation.,  Mild tricompartmental degenerative changes noted and small knee joint effusion -Supportive care,  Voltaren gel -Physical therapy  AKI (presumed) -Likely ATN in the setting of hypotension, improving, baseline unknown  Lactic Acidosis -resolved after hydration given in ED  Type 2 DM -continue SSI coverage and CBG monitoring  Thrombocytopenia -Improving, recheck CBC in AM  DVT prophylaxis: IV heparin infusion Code Status: Full  Family Communication: No family at bedside Disposition: Home pending above workup   Consultants:  Cardiology   Procedure LHC 1/4 high-grade disease of proximal LAD and obtuse marginal with RFR positive ostial and proximal to mid right coronary artery disease, PCI of the right coronary artery was pursued with 2 overlapping drug-eluting stents placed.  Antimicrobials:  N/a   Objective: Vitals:   03/31/22 0038 03/31/22 0354 03/31/22 0732 03/31/22 0739  BP: 95/61 124/89  108/63  Pulse: 80 98 99 98  Resp: '19 16  19  '$ Temp: 98.8 F (37.1 C) 97.7 F (36.5 C) 98.3 F (36.8 C) 97.7 F (36.5 C)  TempSrc: Oral Oral Oral Oral  SpO2: 96% 98%  97%  Weight:  108.5 kg    Height:        Intake/Output  Summary (Last 24 hours) at 03/31/2022 1140 Last data filed at 03/30/2022 2212 Gross per 24 hour  Intake 0 ml  Output 600 ml  Net -600 ml    Examination:  General exam: Obese male laying in bed, AAOx3 CVS: S1-S2, irregularly irregular rhythm Lungs: Decreased breath sounds at the  bases Abdomen: Soft, nontender, bowel sounds present Extremities: 2+ edema, minimal tenderness on full range of motion of right knee Skin: No rashes, lesions or ulcers. Psychiatry: Anxious  Data Reviewed: I have personally reviewed following labs and imaging studies  CBC: Recent Labs  Lab 03/28/22 1816 03/30/22 1520 03/30/22 1524 03/30/22 1526 03/31/22 0532  WBC 11.5*  --   --   --  6.3  HGB 13.3 11.9* 12.2* 12.2* 12.3*  HCT 40.1 35.0* 36.0* 36.0* 35.4*  MCV 92.4  --   --   --  89.8  PLT 118*  --   --   --  100*    Basic Metabolic Panel: Recent Labs  Lab 03/28/22 1816 03/29/22 1125 03/30/22 0457 03/30/22 1520 03/30/22 1524 03/30/22 1526 03/31/22 0532  NA 139 139 139 140 140 141 137  K 3.6 3.3* 3.3* 4.2 4.1 4.1 4.3  CL 97* 97* 99  --   --   --  105  CO2 31 32 27  --   --   --  24  GLUCOSE 155* 158* 165*  --   --   --  168*  BUN 90* 74* 50*  --   --   --  31*  CREATININE 2.71* 1.68* 1.44*  --   --   --  1.16  CALCIUM 8.3* 8.1* 8.2*  --   --   --  8.1*  MG  --   --  1.6*  --   --   --   --     CBG: Recent Labs  Lab 03/30/22 0617 03/30/22 0725 03/30/22 1102 03/30/22 2206 03/31/22 0850  GLUCAP 174* 189* 189* 177* 174*    Recent Results (from the past 240 hour(s))  Culture, blood (Routine x 2)     Status: None (Preliminary result)   Collection Time: 03/28/22  6:16 PM   Specimen: BLOOD  Result Value Ref Range Status   Specimen Description BLOOD LEFT ANTECUBITAL  Final   Special Requests   Final    BOTTLES DRAWN AEROBIC AND ANAEROBIC Blood Culture results may not be optimal due to an excessive volume of blood received in culture bottles   Culture   Final    NO GROWTH 3 DAYS Performed at [facility], [address], [place], [place] [address]    Report Status PENDING  Incomplete  Culture, blood (Routine x 2)     Status: None (Preliminary result)   Collection Time: 03/28/22  6:16 PM   Specimen: BLOOD  Result Value Ref Range Status   Specimen Description  BLOOD BLOOD RIGHT ARM  Final   Special Requests   Final    BOTTLES DRAWN AEROBIC AND ANAEROBIC Blood Culture results may not be optimal due to an excessive volume of blood received in culture bottles   Culture   Final    NO GROWTH 3 DAYS Performed at [facility], [address] [place], [place] [address]    Report Status PENDING  Incomplete     Radiology Studies: CARDIAC CATHETERIZATION  Result Date: 03/30/2022   Prox LAD lesion is 75% stenosed.   1st Mrg lesion is 90% stenosed.   Ost RCA to Prox RCA  lesion is 60% stenosed.   Prox RCA lesion is 70% stenosed.   A stent was successfully placed.   Post intervention, there is a 0% residual stenosis.   Post intervention, there is a 0% residual stenosis. 1.  High-grade disease of proximal LAD and obtuse marginal with RFR positive ostial and proximal to mid right coronary artery disease. 2.  After review with Dr. Marlou Porch, PCI of the right coronary artery was pursued with 2 overlapping drug-eluting stents placed. 3.  Due to dye expenditure, PCI of the proximal LAD and left circumflex will be staged depending on renal function. 4.  LVEDP of 9 mmHg. 5.  Fick cardiac output of 5.5 L/min and Fick cardiac index of 2.4 L/min/m with a mean RA pressure of 11 mmHg, mean wedge pressure of 18 mmHg, and mean PA pressure of 30 mmHg. 6.  Very difficult cannulation of right coronary artery; please see procedural details. Recommendation: Given history of atrial fibrillation the patient will be maintained on triple therapy for 1 month and then Plavix and Eliquis for 6 months in total and then Eliquis indefinitely.  Depending on the patient's creatinine staged PCI of the LAD and left circumflex will be pursued.   US Venous Img Lower Bilateral (DVT)  Result Date: 03/29/2022 CLINICAL DATA:  Bilateral leg edema EXAM: BILATERAL LOWER EXTREMITY VENOUS DOPPLER ULTRASOUND TECHNIQUE: Gray-scale sonography with compression, as well as color and duplex ultrasound, were performed to  evaluate the deep venous system(s) from the level of the common femoral vein through the popliteal and proximal calf veins. COMPARISON:  None Available. FINDINGS: VENOUS Normal compressibility of the common femoral, superficial femoral, and popliteal veins, as well as the visualized calf veins. Visualized portions of profunda femoral vein and great saphenous vein unremarkable. No filling defects to suggest DVT on grayscale or color Doppler imaging. Doppler waveforms show normal direction of venous flow, normal respiratory plasticity and response to augmentation. OTHER None. Limitations: none IMPRESSION: Negative for DVT in the bilateral lower extremities Electronically Signed   By: Merilyn Baba M.D.   On: 03/29/2022 12:07    Scheduled Meds:  aspirin  81 mg Oral Daily   atorvastatin  40 mg Oral Daily   clopidogrel  75 mg Oral Q breakfast   diclofenac Sodium  2 g Topical QID   insulin aspart  0-9 Units Subcutaneous TID WC   metoprolol tartrate  50 mg Oral Q6H   sodium chloride flush  3 mL Intravenous Q12H   Continuous Infusions:  sodium chloride     sodium chloride 50 mL/hr at 03/31/22 1001   heparin 1,500 Units/hr (03/31/22 0945)     LOS: 3 days   Time spent: 38 mins  Domenic Polite, MD   03/31/2022, 11:40 AM

## 2022-03-31 NOTE — Progress Notes (Addendum)
Rounding Note    Patient Name: Arthur Wilson Date of Encounter: 03/31/2022  [place] Cardiologist: Rozann Lesches, MD   Subjective   Patient reports no chest pain, palpitations, or shortness of breath this morning. Continues to have right knee pain and bilateral leg swelling.  Inpatient Medications    Scheduled Meds:  aspirin  81 mg Oral Daily   atorvastatin  40 mg Oral Daily   clopidogrel  75 mg Oral Q breakfast   diclofenac Sodium  2 g Topical QID   insulin aspart  0-9 Units Subcutaneous TID WC   metoprolol tartrate  25 mg Oral Q6H   sodium chloride flush  3 mL Intravenous Q12H   Continuous Infusions:  sodium chloride 100 mL/hr (03/30/22 1909)   sodium chloride     heparin 1,650 Units/hr (03/30/22 2323)   PRN Meds: sodium chloride, acetaminophen, fentaNYL (SUBLIMAZE) injection, ondansetron (ZOFRAN) IV, polyvinyl alcohol, sodium chloride flush   Vital Signs    Vitals:   03/31/22 0038 03/31/22 0354 03/31/22 0732 03/31/22 0739  BP: 95/61 124/89  108/63  Pulse: 80 98 99 98  Resp: '19 16  19  '$ Temp: 98.8 F (37.1 C) 97.7 F (36.5 C) 98.3 F (36.8 C) 97.7 F (36.5 C)  TempSrc: Oral Oral Oral Oral  SpO2: 96% 98%  97%  Weight:  108.5 kg    Height:        Intake/Output Summary (Last 24 hours) at 03/31/2022 0814 Last data filed at 03/30/2022 2212 Gross per 24 hour  Intake 240 ml  Output 1000 ml  Net -760 ml      03/31/2022    3:54 AM 03/30/2022    1:00 AM 03/30/2022   12:48 AM  Last 3 Weights  Weight (lbs) 239 lb 3.2 oz 243 lb 13.3 oz 243 lb 13.3 oz  Weight (kg) 108.5 kg 110.6 kg 110.6 kg      Telemetry    Atrial fibrillation with intermittent RVR. Ventricular rates typically 100-120bpm - Personally Reviewed  ECG    Afib with RVR - Personally Reviewed  Physical Exam   GEN: No acute distress.   Neck: No JVD Cardiac: irregularly irregular, no murmurs, rubs, or gallops.  Respiratory: Clear to auscultation bilaterally. GI: Soft, nontender,  non-distended  MS: bilateral lower extremity edema 3+; No deformity. Neuro:  Nonfocal  Psych: Normal affect   Labs    High Sensitivity Troponin:   Recent Labs  Lab 03/28/22 1816 03/28/22 2029 03/29/22 1125 03/30/22 0457  TROPONINIHS 3,628* 3,084* 3,058* 2,688*     Chemistry Recent Labs  Lab 03/29/22 1125 03/30/22 0457 03/30/22 1520 03/30/22 1524 03/30/22 1526 03/31/22 0532  NA 139 139   < > 140 141 137  K 3.3* 3.3*   < > 4.1 4.1 4.3  CL 97* 99  --   --   --  105  CO2 32 27  --   --   --  24  GLUCOSE 158* 165*  --   --   --  168*  BUN 74* 50*  --   --   --  31*  CREATININE 1.68* 1.44*  --   --   --  1.16  CALCIUM 8.1* 8.2*  --   --   --  8.1*  MG  --  1.6*  --   --   --   --   PROT  --   --   --   --   --  4.6*  ALBUMIN  --   --   --   --   --  2.2*  AST  --   --   --   --   --  33  ALT  --   --   --   --   --  75*  ALKPHOS  --   --   --   --   --  32*  BILITOT  --   --   --   --   --  1.7*  GFRNONAA 42* 51*  --   --   --  >60  ANIONGAP 10 13  --   --   --  8   < > = values in this interval not displayed.    Lipids No results for input(s): "CHOL", "TRIG", "HDL", "LABVLDL", "LDLCALC", "CHOLHDL" in the last 168 hours.  Hematology Recent Labs  Lab 03/28/22 1816 03/30/22 1520 03/30/22 1524 03/30/22 1526 03/31/22 0532  WBC 11.5*  --   --   --  6.3  RBC 4.34  --   --   --  3.94*  HGB 13.3   < > 12.2* 12.2* 12.3*  HCT 40.1   < > 36.0* 36.0* 35.4*  MCV 92.4  --   --   --  89.8  MCH 30.6  --   --   --  31.2  MCHC 33.2  --   --   --  34.7  RDW 15.5  --   --   --  15.4  PLT 118*  --   --   --  100*   < > = values in this interval not displayed.   Thyroid No results for input(s): "TSH", "FREET4" in the last 168 hours.  BNP Recent Labs  Lab 03/28/22 1816  BNP 531.0*    DDimer No results for input(s): "DDIMER" in the last 168 hours.   Radiology    CARDIAC CATHETERIZATION  Result Date: 03/30/2022   Prox LAD lesion is 75% stenosed.   1st Mrg lesion is 90%  stenosed.   Ost RCA to Prox RCA lesion is 60% stenosed.   Prox RCA lesion is 70% stenosed.   A stent was successfully placed.   Post intervention, there is a 0% residual stenosis.   Post intervention, there is a 0% residual stenosis. 1.  High-grade disease of proximal LAD and obtuse marginal with RFR positive ostial and proximal to mid right coronary artery disease. 2.  After review with Dr. Marlou Porch, PCI of the right coronary artery was pursued with 2 overlapping drug-eluting stents placed. 3.  Due to dye expenditure, PCI of the proximal LAD and left circumflex will be staged depending on renal function. 4.  LVEDP of 9 mmHg. 5.  Fick cardiac output of 5.5 L/min and Fick cardiac index of 2.4 L/min/m with a mean RA pressure of 11 mmHg, mean wedge pressure of 18 mmHg, and mean PA pressure of 30 mmHg. 6.  Very difficult cannulation of right coronary artery; please see procedural details. Recommendation: Given history of atrial fibrillation the patient will be maintained on triple therapy for 1 month and then Plavix and Eliquis for 6 months in total and then Eliquis indefinitely.  Depending on the patient's creatinine staged PCI of the LAD and left circumflex will be pursued.   US Venous Img Lower Bilateral (DVT)  Result Date: 03/29/2022 CLINICAL DATA:  Bilateral leg edema EXAM: BILATERAL LOWER EXTREMITY VENOUS DOPPLER ULTRASOUND TECHNIQUE: Gray-scale sonography with compression, as well as color and duplex ultrasound, were performed to evaluate the deep venous system(s) from the level of the  common femoral vein through the popliteal and proximal calf veins. COMPARISON:  None Available. FINDINGS: VENOUS Normal compressibility of the common femoral, superficial femoral, and popliteal veins, as well as the visualized calf veins. Visualized portions of profunda femoral vein and great saphenous vein unremarkable. No filling defects to suggest DVT on grayscale or color Doppler imaging. Doppler waveforms show normal  direction of venous flow, normal respiratory plasticity and response to augmentation. OTHER None. Limitations: none IMPRESSION: Negative for DVT in the bilateral lower extremities Electronically Signed   By: Merilyn Baba M.D.   On: 03/29/2022 12:07   ECHOCARDIOGRAM COMPLETE  Result Date: 03/29/2022    ECHOCARDIOGRAM REPORT   Patient Name:   ELZA SORTOR Date of Exam: 03/29/2022 Medical Rec #:  [id number]       Height:       70.0 in Accession #:    [id number]      Weight:       246.4 lb Date of Birth:  [birth year]      BSA:          2.281 m Patient Age:    22 years        BP:           15/77 mmHg Patient Gender: M               HR:           85 bpm. Exam Location:  [facility] Procedure: 2D Echo, Cardiac Doppler and Color Doppler Indications:    CHF-Acute Diastolic S34.19  History:        Patient has prior history of Echocardiogram examinations, most                 recent 11/05/2019. Previous Myocardial Infarction and CAD,                 Arrythmias:Atrial Fibrillation; Risk Factors:Hypertension and                 Diabetes. OSA (obstructive sleep apnea) (From Hx).  Sonographer:    Alvino Chapel RCS Referring Phys: [id number] OLADAPO ADEFESO IMPRESSIONS  1. Left ventricular ejection fraction, by estimation, is 50 to 55%. The left ventricle has low normal function. The left ventricle demonstrates regional wall motion abnormalities - cannot exclude mild inferior mid to basal hypokinesis. There is mild left ventricular hypertrophy. Left ventricular diastolic parameters are indeterminate.  2. Right ventricular systolic function is mildly reduced. The right ventricular size is normal. There is normal pulmonary artery systolic pressure. The estimated right ventricular systolic pressure is 89.2 mmHg.  3. Left atrial size was moderately dilated.  4. Right atrial size was mildly dilated.  5. The mitral valve is grossly normal. Mild to moderate mitral valve regurgitation.  6. The aortic valve is tricuspid. There is mild  calcification of the aortic valve. Aortic valve regurgitation is not visualized.  7. The inferior vena cava is normal in size with greater than 50% respiratory variability, suggesting right atrial pressure of 3 mmHg. Comparison(s): Prior images reviewed side by side. LVEF low normal at 50-55% with possible mild, mid to basal inferior hypokinesis. Mild RV dysfunction with upper normal estimated RVSP. FINDINGS  Left Ventricle: Left ventricular ejection fraction, by estimation, is 50 to 55%. The left ventricle has low normal function. The left ventricle demonstrates regional wall motion abnormalities. The left ventricular internal cavity size was normal in size. There is mild left ventricular hypertrophy. Left ventricular diastolic function could not be evaluated due to  atrial fibrillation. Left ventricular diastolic parameters are indeterminate. Right Ventricle: The right ventricular size is normal. No increase in right ventricular wall thickness. Right ventricular systolic function is mildly reduced. There is normal pulmonary artery systolic pressure. The tricuspid regurgitant velocity is 2.76 m/s, and with an assumed right atrial pressure of 3 mmHg, the estimated right ventricular systolic pressure is 31.4 mmHg. Left Atrium: Left atrial size was moderately dilated. Right Atrium: Right atrial size was mildly dilated. Pericardium: Trivial pericardial effusion is present. The pericardial effusion is posterior to the left ventricle. Mitral Valve: The mitral valve is grossly normal. Mild to moderate mitral valve regurgitation. Tricuspid Valve: The tricuspid valve is grossly normal. Tricuspid valve regurgitation is mild. Aortic Valve: The aortic valve is tricuspid. There is mild calcification of the aortic valve. Aortic valve regurgitation is not visualized. Pulmonic Valve: The pulmonic valve was grossly normal. Pulmonic valve regurgitation is trivial. Aorta: The aortic root is normal in size and structure. Venous: The  inferior vena cava is normal in size with greater than 50% respiratory variability, suggesting right atrial pressure of 3 mmHg. IAS/Shunts: No atrial level shunt detected by color flow Doppler.  LEFT VENTRICLE PLAX 2D LVIDd:         4.70 cm LVIDs:         2.90 cm LV PW:         1.10 cm LV IVS:        1.30 cm LVOT diam:     2.10 cm LV SV:         59 LV SV Index:   26 LVOT Area:     3.46 cm  RIGHT VENTRICLE TAPSE (M-mode): 1.5 cm LEFT ATRIUM              Index        RIGHT ATRIUM           Index LA diam:        4.20 cm  1.84 cm/m   RA Area:     25.00 cm LA Vol (A2C):   128.0 ml 56.12 ml/m  RA Volume:   76.10 ml  33.36 ml/m LA Vol (A4C):   104.0 ml 45.60 ml/m LA Biplane Vol: 118.0 ml 51.73 ml/m  AORTIC VALVE LVOT Vmax:   75.70 cm/s LVOT Vmean:  55.000 cm/s LVOT VTI:    0.169 m  AORTA Ao Root diam: 3.70 cm MITRAL VALVE                TRICUSPID VALVE MV Area (PHT): 4.39 cm     TR Peak grad:   30.5 mmHg MV Decel Time: 173 msec     TR Vmax:        276.00 cm/s MV E velocity: 115.00 cm/s                             SHUNTS                             Systemic VTI:  0.17 m                             Systemic Diam: 2.10 cm Rozann Lesches MD Electronically signed by Rozann Lesches MD Signature Date/Time: 03/29/2022/11:42:43 AM    Final     Cardiac Studies   03/29/22 TTE   IMPRESSIONS     1.  Left ventricular ejection fraction, by estimation, is 50 to 55%. The  left ventricle has low normal function. The left ventricle demonstrates  regional wall motion abnormalities - cannot exclude mild inferior mid to  basal hypokinesis. There is mild  left ventricular hypertrophy. Left ventricular diastolic parameters are  indeterminate.   2. Right ventricular systolic function is mildly reduced. The right  ventricular size is normal. There is normal pulmonary artery systolic  pressure. The estimated right ventricular systolic pressure is 02.5 mmHg.   3. Left atrial size was moderately dilated.   4. Right atrial size  was mildly dilated.   5. The mitral valve is grossly normal. Mild to moderate mitral valve  regurgitation.   6. The aortic valve is tricuspid. There is mild calcification of the  aortic valve. Aortic valve regurgitation is not visualized.   7. The inferior vena cava is normal in size with greater than 50%  respiratory variability, suggesting right atrial pressure of 3 mmHg.   Comparison(s): Prior images reviewed side by side. LVEF low normal at  50-55% with possible mild, mid to basal inferior hypokinesis. Mild RV  dysfunction with upper normal estimated RVSP.   FINDINGS   Left Ventricle: Left ventricular ejection fraction, by estimation, is 50  to 55%. The left ventricle has low normal function. The left ventricle  demonstrates regional wall motion abnormalities. The left ventricular  internal cavity size was normal in  size. There is mild left ventricular hypertrophy. Left ventricular  diastolic function could not be evaluated due to atrial fibrillation. Left  ventricular diastolic parameters are indeterminate.   Right Ventricle: The right ventricular size is normal. No increase in  right ventricular wall thickness. Right ventricular systolic function is  mildly reduced. There is normal pulmonary artery systolic pressure. The  tricuspid regurgitant velocity is 2.76  m/s, and with an assumed right atrial pressure of 3 mmHg, the estimated  right ventricular systolic pressure is 42.7 mmHg.   Left Atrium: Left atrial size was moderately dilated.   Right Atrium: Right atrial size was mildly dilated.   Pericardium: Trivial pericardial effusion is present. The pericardial  effusion is posterior to the left ventricle.   Mitral Valve: The mitral valve is grossly normal. Mild to moderate mitral  valve regurgitation.   Tricuspid Valve: The tricuspid valve is grossly normal. Tricuspid valve  regurgitation is mild.   Aortic Valve: The aortic valve is tricuspid. There is mild  calcification  of the aortic valve. Aortic valve regurgitation is not visualized.   Pulmonic Valve: The pulmonic valve was grossly normal. Pulmonic valve  regurgitation is trivial.   Aorta: The aortic root is normal in size and structure.   Venous: The inferior vena cava is normal in size with greater than 50%  respiratory variability, suggesting right atrial pressure of 3 mmHg.   IAS/Shunts: No atrial level shunt detected by color flow Doppler.   03/30/22 LHC    Prox LAD lesion is 75% stenosed.   1st Mrg lesion is 90% stenosed.   Ost RCA to Prox RCA lesion is 60% stenosed.   Prox RCA lesion is 70% stenosed.   A stent was successfully placed.   Post intervention, there is a 0% residual stenosis.   Post intervention, there is a 0% residual stenosis.   1.  High-grade disease of proximal LAD and obtuse marginal with RFR positive ostial and proximal to mid right coronary artery disease. 2.  After review with Dr. Marlou Porch, PCI of the right coronary artery was  pursued with 2 overlapping drug-eluting stents placed. 3.  Due to dye expenditure, PCI of the proximal LAD and left circumflex will be staged depending on renal function. 4.  LVEDP of 9 mmHg. 5.  Fick cardiac output of 5.5 L/min and Fick cardiac index of 2.4 L/min/m with a mean RA pressure of 11 mmHg, mean wedge pressure of 18 mmHg, and mean PA pressure of 30 mmHg. 6.  Very difficult cannulation of right coronary artery; please see procedural details.   Recommendation: Given history of atrial fibrillation the patient will be maintained on triple therapy for 1 month and then Plavix and Eliquis for 6 months in total and then Eliquis indefinitely.  Depending on the patient's creatinine staged PCI of the LAD and left circumflex will be pursued.  Diagnostic Dominance: Right  Intervention    Patient Profile     Arthur Wilson is a 77 y.o. male with a hx of permanent Afib, HTN, OSA who is being seen 03/29/2022 for the evaluation of  NSTEMI, hypotension, AKI at the request of Dr. Wynetta Emery. Patient had a fall and severe knee pain and saw ortho. CT showed no fracture but mild deep vein dilation. He developed worsening LE edema and was started on a diuretic by PCP about 4 days ago. On 1/2 he went to PCP and was hypotensive so sent to ED. He denied any symptoms of chest pain, dyspnea, dizziness, palpitations.   Assessment & Plan    Elevated troponin Regional wall motion abnormalities   Patient admitted following discovery of low BP at his PCP's office on 1/2. Noted to have elevated troponin 3628, 3084, 3058, 2688. Echocardiogram completed, noted with LVEF 50-55% (down from 60-65% in 2021) and concern for mild inferior mid to basal hypokinesis. ECG without acute ischemic changes.   LHC on 1/4 with Prox LAD lesion is 75% stenosed, 1st Mrg lesion is 90% stenosed, Ost RCA to Prox RCA lesion is 60% stenosed, Prox RCA lesion is 70% stenosed. Overlapping DES stents placed in RCA with 0% residual stenosis.  Plan for staged PCI of LAD and Lcx today   Atrial fibrillation   Patient with afib first diagnosed around May of 2022. Given lack of symptoms, it appears that a rate control strategy has been pursued. Patient endorses compliance with home Eliquis '5mg'$ /Bisoprolol '5mg'$  and reports ventricular rates are usually 70s-80s per his Fitbit device.   Bisoprolol was held following hypotensive episode but BP now recovered. HR elevated yesterday morning, suspect this is primarily due to significant right knee pain. Increase Metoprolol Tartrate to '50mg'$  Q6 hour. Continue heparin infusion Although patient appears to have been without symptoms with afib, would consider EP evaluation for rhythm control strategies.   Lower extremity edema, bilateral   Patient's admission appears to be in part a result of diuretic induced hypotension after he was given Lasix for significant lower extremity edema/weeping. RHC completed 1/4 found  LVEDP of 9 mmHg. Fick  cardiac output of 5.5 L/min and Fick cardiac index of 2.4 L/min/m with a mean RA pressure of 11 mmHg, mean wedge pressure of 18 mmHg, and mean PA pressure of 30 mmHg.   Patient still has 3+ bilateral edema on exam today. Otherwise appears euvolemic and RHC without obvious HF etiology. Given that he reports chronic leg swelling (though to a lesser degree), I wonder about venous insuffiencey.  Continue to hold diuretics given plans for staged PCI today.   AKI   Patient with creatinine elevated to 2.71 on admission following 3 days  of PO lasix '40mg'$ . Now improved to 1.16. Pre-cath fluids given today.       For questions or updates, please contact [place] Please consult [email or web address] for contact info under        Signed, Lily Kocher, PA-C  03/31/2022, 8:14 AM    Personally seen and examined. Agree with above.  Will go ahead and proceed with staged PCI today of LAD and OM.  Discussed with Dr. Ali Lowe.  Creatinine is decreased once again today with hydration.  Non-ST elevation myocardial infarction - Troponin 3500.  Mildly reduced ejection fraction.  We will go ahead and continue with goal-directed medical therapy.  Cardiac catheterization staged procedure today.  Atrial fibrillation with rapid ventricular response - Currently on metoprolol tartrate 25 mg every 6 hours.  We will increase this to 50 every 6 hours.  Heart rate still above 110 at times.  He will be on triple therapy for 30 days post PCI. After 30 days, we will drop the aspirin 81 mg and continue with Plavix and Eliquis. Watch for any signs of bleeding.  Candee Furbish, MD

## 2022-03-31 NOTE — Progress Notes (Signed)
ANTICOAGULATION CONSULT NOTE  Pharmacy Consult for Heparin Indication: chest pain/ACS  No Known Allergies  Patient Measurements: Height: '5\' 10"'$  (177.8 cm) Weight: 108.5 kg (239 lb 3.2 oz) IBW/kg (Calculated) : 73  Heparin Dosing Weight: 97 kg  Vital Signs: Temp: 97.7 F (36.5 C) (01/05 0739) Temp Source: Oral (01/05 0739) BP: 108/63 (01/05 0739) Pulse Rate: 98 (01/05 0739)  Labs: Recent Labs    03/28/22 1816 03/28/22 1816 03/28/22 2029 03/29/22 1125 03/29/22 1911 03/30/22 0457 03/30/22 0935 03/30/22 1520 03/30/22 1524 03/30/22 1526 03/31/22 0532 03/31/22 0740  HGB 13.3  --   --   --   --   --   --    < > 12.2* 12.2* 12.3*  --   HCT 40.1  --   --   --   --   --   --    < > 36.0* 36.0* 35.4*  --   PLT 118*  --   --   --   --   --   --   --   --   --  100*  --   APTT  --    < >  --  85* 56*  --  93*  --   --   --   --  126*  LABPROT 16.7*  --   --   --   --   --   --   --   --   --   --   --   INR 1.4*  --   --   --   --   --   --   --   --   --   --   --   HEPARINUNFRC  --   --   --   --   --   --  >1.10*  --   --   --   --   --   CREATININE 2.71*  --   --  1.68*  --  1.44*  --   --   --   --  1.16  --   TROPONINIHS 3,628*  --  3,084* 3,058*  --  2,688*  --   --   --   --   --   --    < > = values in this interval not displayed.     Estimated Creatinine Clearance: 68.9 mL/min (by C-G formula based on SCr of 1.16 mg/dL).   Assessment: 77 YO m with medical history significant for atrial fibrillation on apixaban PTA who is admitted for elevated troponins. Pharmacy consulted to manage heparin for ACS. Last apixaban dose 01/02 AM per medication history. Will adjust heparin based on aPTT until correlation with heparin levels.   Patient s/p heart cath today - d/w cardiology and will resume heparin 2 hours post TR band removal. Band removed ~10pm.   aPTT is above goal at 126 seconds, heparin level pending but suspect elevated due to DOAC use. H/H stable, pltc remains  low.  Goal of Therapy:  Heparin level 0.3-0.7 units/ml aPTT 66-102 seconds Monitor platelets by anticoagulation protocol: Yes   Plan:  Reduce heparin to 1500 units/h Staged PCI later today  Arrie Senate, PharmD, BCPS, [facility] Clinical Pharmacist [phone] Please check AMION for all [place] numbers 03/31/2022

## 2022-03-31 NOTE — Progress Notes (Addendum)
   Heart Failure Stewardship Pharmacist Progress Note   PCP: [name], MD PCP-Cardiologist: Rozann Lesches, MD    HPI:  77 yo M with PMH of afib, HTN, and OSA.  Presented to the ED on 1/2 with hypotension, LE edema, and generalized weakness and confusion. Recently went to orthopedic for knee pain and was started on furosemide for edema. BNP, troponin, and lactic acid elevated on arrival. Found to be in afib RVR. CXR showed cardiomegaly without acute cardiopulmonary process. ECHO 1/3 showed LVEF 50-55%, regional wall motion abnormalities, mild LVH, RV mildly reduced. Taken for [facility] on 1/4 and found to have high grade stenosis of RCA s/p 2 overlapping DES. Was also found to have disease in LAD, plan for staged PCI with DES today. RA 11, PA 30, wedge 18, CO 5.6, CI 2.5.  Current HF Medications: Beta Blocker: metoprolol tartrate 50 mg q6h  Prior to admission HF Medications: Diuretic: furosemide 40 mg daily Beta blocker: bisoprolol 5 mg daily ACE/ARB/ARNI: olmesartan 20 mg daily  Pertinent Lab Values: Serum creatinine 1.16, BUN 31, Potassium 4.3, Sodium 137, BNP 531.0, Magnesium 1.6, A1c 7.4   Vital Signs: Weight: 239 lbs (admission weight: 246 lbs) Blood pressure: 100-120/80s  Heart rate: 90-100s  I/O: -1.7L yesterday; net -3.2L  Medication Assistance / Insurance Benefits Check: Does the patient have prescription insurance?  Yes Type of insurance plan: [place] [employer]  Outpatient Pharmacy:  Prior to admission outpatient pharmacy: [place] Is the patient willing to use [place] pharmacy at discharge? Yes Is the patient willing to transition their outpatient pharmacy to utilize a [facility] outpatient pharmacy?   No    Assessment: 1. Acute on chronic diastolic CHF (LVEF 62-94%), due to ICM. NYHA class III symptoms. - Consider resuming lasix after cath given LE edema and wedge slightly elevated on RHC. Strict I/Os and daily weights. Keep K>4 and recheck magnesium  in AM. - Agree with increasing metoprolol to 50 mg q6h. Was on bisoprolol PTA.  - Consider adding SGLT2i and MRA prior to discharge pending renal function and BP.   Plan: 1) Medication changes recommended at this time: - Consider restarting lasix after cath  2) Patient assistance: - Farxiga copay $45  3)  Education  - To be completed prior to discharge  Kerby Nora, PharmD, BCPS Heart Failure Stewardship Pharmacist Phone [phone]

## 2022-04-01 DIAGNOSIS — I214 Non-ST elevation (NSTEMI) myocardial infarction: Secondary | ICD-10-CM | POA: Diagnosis not present

## 2022-04-01 LAB — CBC
HCT: 33 % — ABNORMAL LOW (ref 39.0–52.0)
Hemoglobin: 11.3 g/dL — ABNORMAL LOW (ref 13.0–17.0)
MCH: 30.6 pg (ref 26.0–34.0)
MCHC: 34.2 g/dL (ref 30.0–36.0)
MCV: 89.4 fL (ref 80.0–100.0)
Platelets: 106 10*3/uL — ABNORMAL LOW (ref 150–400)
RBC: 3.69 MIL/uL — ABNORMAL LOW (ref 4.22–5.81)
RDW: 15.7 % — ABNORMAL HIGH (ref 11.5–15.5)
WBC: 6.4 10*3/uL (ref 4.0–10.5)
nRBC: 0 % (ref 0.0–0.2)

## 2022-04-01 LAB — BASIC METABOLIC PANEL
Anion gap: 9 (ref 5–15)
BUN: 30 mg/dL — ABNORMAL HIGH (ref 8–23)
CO2: 22 mmol/L (ref 22–32)
Calcium: 7.7 mg/dL — ABNORMAL LOW (ref 8.9–10.3)
Chloride: 106 mmol/L (ref 98–111)
Creatinine, Ser: 1.19 mg/dL (ref 0.61–1.24)
GFR, Estimated: >60 mL/min (ref >60)
Glucose, Bld: 146 mg/dL — ABNORMAL HIGH (ref 70–99)
Potassium: 4.1 mmol/L (ref 3.5–5.1)
Sodium: 137 mmol/L (ref 135–145)

## 2022-04-01 LAB — LIPOPROTEIN A (LPA): Lipoprotein (a): 120.3 nmol/L — ABNORMAL HIGH (ref <75.0)

## 2022-04-01 LAB — GLUCOSE, CAPILLARY
Glucose-Capillary: 188 mg/dL — ABNORMAL HIGH (ref 70–99)
Glucose-Capillary: 245 mg/dL — ABNORMAL HIGH (ref 70–99)
Glucose-Capillary: 203 mg/dL — ABNORMAL HIGH (ref 70–99)
Glucose-Capillary: 204 mg/dL — ABNORMAL HIGH (ref 70–99)

## 2022-04-01 MED ORDER — FUROSEMIDE 10 MG/ML IJ SOLN
20.0000 mg | Freq: Two times a day (BID) | INTRAMUSCULAR | Status: DC
  Administered 2022-04-01 (×2): 20 mg via INTRAVENOUS
  Filled 2022-04-01 (×2): qty 2

## 2022-04-01 MED ORDER — LIDOCAINE 5 % EX PTCH
1.0000 | MEDICATED_PATCH | CUTANEOUS | Status: DC
  Administered 2022-04-01 – 2022-04-04 (×4): 1 via TRANSDERMAL
  Filled 2022-04-01 (×4): qty 1

## 2022-04-01 MED ORDER — METOPROLOL SUCCINATE ER 100 MG PO TB24
100.0000 mg | ORAL_TABLET | Freq: Two times a day (BID) | ORAL | Status: DC
  Administered 2022-04-01 – 2022-04-04 (×6): 100 mg via ORAL
  Filled 2022-04-01 (×6): qty 1

## 2022-04-01 MED ORDER — PANTOPRAZOLE SODIUM 40 MG PO TBEC
40.0000 mg | DELAYED_RELEASE_TABLET | Freq: Every day | ORAL | Status: DC
  Administered 2022-04-01 – 2022-04-04 (×4): 40 mg via ORAL
  Filled 2022-04-01 (×4): qty 1

## 2022-04-01 MED ORDER — NAPHAZOLINE-GLYCERIN 0.012-0.25 % OP SOLN: 1.0000 [drp] | Freq: Three times a day (TID) | OPHTHALMIC | Status: DC | PRN

## 2022-04-01 MED ORDER — DEXAMETHASONE 4 MG PO TABS
4.0000 mg | ORAL_TABLET | Freq: Two times a day (BID) | ORAL | Status: AC
  Administered 2022-04-01 (×2): 4 mg via ORAL
  Filled 2022-04-01 (×2): qty 1

## 2022-04-01 MED ORDER — ALBUMIN HUMAN 25 % IV SOLN
25.0000 g | Freq: Four times a day (QID) | INTRAVENOUS | Status: AC
  Administered 2022-04-01 (×2): 25 g via INTRAVENOUS
  Filled 2022-04-01 (×2): qty 100

## 2022-04-01 NOTE — Progress Notes (Signed)
CARDIAC REHAB PHASE I   PRE:  Rate/Rhythm: 100 afib    BP: sitting 106/73    SaO2: 94 RA  MODE:  Ambulation: to door   POST:  Rate/Rhythm: 124 afib    BP: sitting 117/68     SaO2: 95 RA  Pt eager to get out of bed however significant knee pain with movement. Needed mod assist to get to EOB and mod-max assist with gait belt to stand.  Used RW to walk to door but c/o 8/10 pain on right knee therefore turned and ambulated to recliner on other side of bed. Pain improved with sitting however pt did say in continued to come and go. Receiving pain meds from RN, ice, and elevation. No cardiac c/o with ambulation.  Discussed with pt and wife MI, HF/fluid, restrictions, importance of Plavix with stents, low sodium, exercise as tolerated, NTG and CRPII. Pt receptive but somewhat distracted by pain. Will refer to [place]. Pt has PT c/s and expect he will need HHPT.  [phone]  Yves Dill BS, ACSM-CEP 04/01/2022 8:43 AM

## 2022-04-01 NOTE — Evaluation (Signed)
Physical Therapy Evaluation Patient Details Name: Arthur Wilson MRN: [id number] DOB: Mar 01, 1946 Today's Date: 04/01/2022  History of Present Illness  Pt is a 77 yo male who was sent to ED from PCP due to hypotension. Pt started oral lasix 3 days prior when orthopedic MD noticed bilat weeping edema in LEs. Pt fell in early December on R knee and it is now causing significant pain. Images have been done and no injury noted. Pt also found to have NSTEMI or Type 2 MI and had a R and L cardiac cath. PMH: afib on eloquis, bilat LE edema, CHF   Clinical Impression  Pt admitted with above. Pt very limited by R knee pain requiring increased assist for OOB mobility and is currently unable to ambulate due to 10/10 R knee pain in weightbearing. Pt with increased bilat LE edema and believe the extra swelling is causing pain/stiffness in R knee from his recent fall in December. Encouraged pt to sit up minimally on EOB, preferably in chair, for each meal and then complete his LE HEP. Pt also provided with HEP to complete while in supine in bed. Pt strongly desires to go home but is aware he isn't safe to do so at this time until he can mobilize on his own as his wife has lung ca and can't help him physically. Spoke with mobility specialist team to work with patient as well to increase mobilization frequency. Acute PT to cont to follow.       Recommendations for follow up therapy are one component of a multi-disciplinary discharge planning process, led by the attending physician.  Recommendations may be updated based on patient status, additional functional criteria and insurance authorization.  Follow Up Recommendations Home health PT      Assistance Recommended at Discharge Frequent or constant Supervision/Assistance  Patient can return home with the following  A little help with walking and/or transfers;A little help with bathing/dressing/bathroom;Assist for transportation;Help with stairs or ramp for  entrance    Equipment Recommendations Rolling walker (2 wheels)  Recommendations for Other Services       Functional Status Assessment Patient has had a recent decline in their functional status and demonstrates the ability to make significant improvements in function in a reasonable and predictable amount of time.     Precautions / Restrictions Precautions Precautions: Fall Precaution Comments: bilat LE edema Restrictions Weight Bearing Restrictions: No Other Position/Activity Restrictions: pt self limiting with WBing on R LE due to pain      Mobility  Bed Mobility Overal bed mobility: Needs Assistance Bed Mobility: Supine to Sit, Sit to Supine     Supine to sit: Mod assist Sit to supine: Mod assist   General bed mobility comments: HOB elevated, modA for trunk elevation and modA for LE management back into bed, increased time    Transfers Overall transfer level: Needs assistance Equipment used: Rolling walker (2 wheels) Transfers: Sit to/from Stand Sit to Stand: Min assist, Mod assist, From elevated surface (modA for lower surface height, minA from elevated bed)           General transfer comment: verbal cues for safe hand placement, increased time, modA to power up from lower surface height    Ambulation/Gait               General Gait Details: marched in place x 10, 2 times. Pt with increased pain in R knee limiting ability to ambulate. Pt did side step to [facility] with RW.  Stairs  Wheelchair Mobility    Modified Rankin (Stroke Patients Only)       Balance Overall balance assessment: Needs assistance Sitting-balance support: Feet supported, No upper extremity supported Sitting balance-Leahy Scale: Good     Standing balance support: During functional activity, Reliant on assistive device for balance, Bilateral upper extremity supported Standing balance-Leahy Scale: Poor Standing balance comment: dependent on RW due to R Knee pain and  limited ability to WB on it                             Pertinent Vitals/Pain Pain Assessment Pain Assessment: 0-10 Pain Score: 10-Worst pain ever Pain Location: 0/10 R knee pain at rest, 10/10 R knee pain in WBing Pain Descriptors / Indicators: Discomfort, Sharp Pain Intervention(s): Patient requesting pain meds-RN notified    Home Living Family/patient expects to be discharged to:: Private residence Living Arrangements: Spouse/significant other Available Help at Discharge: Family;Available 24 hours/day Type of Home: House Home Access: Level entry       Home Layout: Able to live on main level with bedroom/bathroom Home Equipment: Rolling Walker (2 wheels);Crutches      Prior Function Prior Level of Function : Independent/Modified Independent;Working/employed             Mobility Comments: used RW one day for knee pain s/p fall, owns 3 pharmacies in [place] ADLs Comments: indep     Hand Dominance   Dominant Hand: Right    Extremity/Trunk Assessment   Upper Extremity Assessment Upper Extremity Assessment: Generalized weakness (R UE more swollen and bruised than L UE)    Lower Extremity Assessment Lower Extremity Assessment: Generalized weakness (bilat LE edema, R LE weaker than L due to onset of R Knee pain)    Cervical / Trunk Assessment Cervical / Trunk Assessment: Normal;Other exceptions (has h/o back pain, uses lidocaine patch)  Communication   Communication: No difficulties  Cognition Arousal/Alertness: Awake/alert Behavior During Therapy: WFL for tasks assessed/performed Overall Cognitive Status: Within Functional Limits for tasks assessed                                 General Comments: pt tangential and appears to have depressed spirits regarding his inability to move and medical condition however gave good effort and followed all commands        General Comments General comments (skin integrity, edema, etc.): bilat LE  edema, R UE edema, bruising t/o body, HR from 90s-140s, in afib, BP 117/53    Exercises General Exercises - Lower Extremity Ankle Circles/Pumps: AROM, Both, 10 reps, Supine Quad Sets: AROM, Both, 10 reps, Supine Gluteal Sets: AROM, Both, 10 reps, Supine Long Arc Quad: AROM, Both, 10 reps, Seated Heel Slides: AROM, Both, 5 reps, Supine   Assessment/Plan    PT Assessment Patient needs continued PT services  PT Problem List Decreased strength;Decreased activity tolerance;Decreased balance;Decreased mobility;Decreased range of motion       PT Treatment Interventions DME instruction;Gait training;Stair training;Functional mobility training;Therapeutic activities;Therapeutic exercise;Balance training    PT Goals (Current goals can be found in the Care Plan section)  Acute Rehab PT Goals Patient Stated Goal: walk and go home PT Goal Formulation: With patient Time For Goal Achievement: 04/15/22 Potential to Achieve Goals: Good    Frequency Min 3X/week     Co-evaluation               AM-PAC PT "6  Clicks" Mobility  Outcome Measure Help needed turning from your back to your side while in a flat bed without using bedrails?: A Lot Help needed moving from lying on your back to sitting on the side of a flat bed without using bedrails?: A Lot Help needed moving to and from a bed to a chair (including a wheelchair)?: A Lot Help needed standing up from a chair using your arms (e.g., wheelchair or bedside chair)?: A Lot Help needed to walk in hospital room?: A Lot Help needed climbing 3-5 steps with a railing? : A Lot 6 Click Score: 12    End of Session Equipment Utilized During Treatment: Gait belt Activity Tolerance: Patient limited by pain Patient left: in bed;with call bell/phone within reach;with bed alarm set Nurse Communication: Mobility status PT Visit Diagnosis: Unsteadiness on feet (R26.81);Muscle weakness (generalized) (M62.81);Difficulty in walking, not elsewhere  classified (R26.2)    Time: 1305-1400 PT Time Calculation (min) (ACUTE ONLY): 55 min   Charges:   PT Evaluation $PT Eval Moderate Complexity: 1 Mod PT Treatments $Therapeutic Exercise: 23-37 mins $Therapeutic Activity: 8-22 mins        Kittie Plater, PT, DPT Acute Rehabilitation Services Secure chat preferred Office #: [phone]   Berline Lopes 04/01/2022, 3:32 PM

## 2022-04-01 NOTE — Progress Notes (Signed)
Rounding Note    Patient Name: Arthur Wilson Date of Encounter: 04/01/2022  [place] Cardiologist: Rozann Lesches, MD   Subjective   NAEO. S/p staged PCI to the LAD yesterday.  Inpatient Medications    Scheduled Meds:  apixaban  5 mg Oral BID   aspirin  81 mg Oral Daily   atorvastatin  40 mg Oral Daily   clopidogrel  75 mg Oral Q breakfast   dexamethasone  4 mg Oral Q12H   diclofenac Sodium  2 g Topical QID   feeding supplement  1 Container Oral TID BM   furosemide  20 mg Intravenous BID   insulin aspart  0-9 Units Subcutaneous TID WC   lidocaine  1 patch Transdermal Q24H   metoprolol tartrate  50 mg Oral Q6H   pantoprazole  40 mg Oral Q1200   sodium chloride flush  3 mL Intravenous Q12H   sodium chloride flush  3 mL Intravenous Q12H   Continuous Infusions:  sodium chloride     sodium chloride     albumin human 12.5 g (04/01/22 1002)   PRN Meds: sodium chloride, sodium chloride, acetaminophen, fentaNYL (SUBLIMAZE) injection, melatonin, naphazoline-glycerin, ondansetron (ZOFRAN) IV, polyvinyl alcohol, sodium chloride flush, sodium chloride flush   Vital Signs    Vitals:   03/31/22 2336 04/01/22 0315 04/01/22 0445 04/01/22 0818  BP: 106/69 109/63 101/60 106/73  Pulse: 75 81 84 95  Resp: '16 17  16  '$ Temp: 99.1 F (37.3 C) 98.1 F (36.7 C)  98.4 F (36.9 C)  TempSrc: Oral Oral  Oral  SpO2: 95% 93% 94% 96%  Weight:  110.2 kg    Height:        Intake/Output Summary (Last 24 hours) at 04/01/2022 1043 Last data filed at 04/01/2022 0300 Gross per 24 hour  Intake 515.39 ml  Output 1400 ml  Net -884.61 ml      04/01/2022    3:15 AM 03/31/2022    3:54 AM 03/30/2022    1:00 AM  Last 3 Weights  Weight (lbs) 242 lb 15.2 oz 239 lb 3.2 oz 243 lb 13.3 oz  Weight (kg) 110.2 kg 108.5 kg 110.6 kg      Telemetry    AF - Personally Reviewed  ECG    Personally Reviewed  Physical Exam   GEN: No acute distress.   Neck: No JVD Cardiac: irregularly  irregular, no murmurs, rubs, or gallops. Radial access sites soft with good pulses. Significant bruising in bilateral upper extremities. Respiratory: Clear to auscultation bilaterally. GI: Soft, nontender, non-distended  MS: 2+ pitting bilateral LE edema; No deformity. Neuro:  Nonfocal  Psych: Normal affect   Labs    High Sensitivity Troponin:   Recent Labs  Lab 03/28/22 1816 03/28/22 2029 03/29/22 1125 03/30/22 0457  TROPONINIHS 3,628* 3,084* 3,058* 2,688*     Chemistry Recent Labs  Lab 03/30/22 0457 03/30/22 1520 03/30/22 1526 03/31/22 0532 04/01/22 0239  NA 139   < > 141 137 137  K 3.3*   < > 4.1 4.3 4.1  CL 99  --   --  105 106  CO2 27  --   --  24 22  GLUCOSE 165*  --   --  168* 146*  BUN 50*  --   --  31* 30*  CREATININE 1.44*  --   --  1.16 1.19  CALCIUM 8.2*  --   --  8.1* 7.7*  MG 1.6*  --   --   --   --  PROT  --   --   --  4.6*  --   ALBUMIN  --   --   --  2.2*  --   AST  --   --   --  33  --   ALT  --   --   --  75*  --   ALKPHOS  --   --   --  32*  --   BILITOT  --   --   --  1.7*  --   GFRNONAA 51*  --   --  >60 >60  ANIONGAP 13  --   --  8 9   < > = values in this interval not displayed.    Lipids No results for input(s): "CHOL", "TRIG", "HDL", "LABVLDL", "LDLCALC", "CHOLHDL" in the last 168 hours.  Hematology Recent Labs  Lab 03/28/22 1816 03/30/22 1520 03/30/22 1526 03/31/22 0532 04/01/22 0239  WBC 11.5*  --   --  6.3 6.4  RBC 4.34  --   --  3.94* 3.69*  HGB 13.3   < > 12.2* 12.3* 11.3*  HCT 40.1   < > 36.0* 35.4* 33.0*  MCV 92.4  --   --  89.8 89.4  MCH 30.6  --   --  31.2 30.6  MCHC 33.2  --   --  34.7 34.2  RDW 15.5  --   --  15.4 15.7*  PLT 118*  --   --  100* 106*   < > = values in this interval not displayed.   Thyroid No results for input(s): "TSH", "FREET4" in the last 168 hours.  BNP Recent Labs  Lab 03/28/22 1816  BNP 531.0*    DDimer No results for input(s): "DDIMER" in the last 168 hours.   Radiology    CARDIAC  CATHETERIZATION  Result Date: 04/01/2022   Prox LAD lesion is 75% stenosed.   1st Mrg lesion is 90% stenosed.   Non-stenotic Ost RCA to Prox RCA lesion was previously treated.   Non-stenotic Prox RCA lesion was previously treated.   A stent was successfully placed.   Post intervention, there is a 0% residual stenosis. 1.  Successful PCI of proximal LAD aided by orbital atherectomy with OCT optimization. 2.  Residual high-grade and highly calcified lesion of the obtuse marginal; given the relatively small territory subtended by this vessel and that the patient's symptoms are dyspnea rather than angina, after discussion with Dr. Marlou Porch medical therapy will be pursued for this burden of disease. 3.  LVEDP of 10 mmHg but obscured by widely fluctuating RR intervals due to atrial fibrillation. Recommendation: Triple therapy for ideally 4 weeks then Plavix and Eliquis for 6 months in total then Eliquis monotherapy thereafter.   CARDIAC CATHETERIZATION  Result Date: 03/30/2022   Prox LAD lesion is 75% stenosed.   1st Mrg lesion is 90% stenosed.   Ost RCA to Prox RCA lesion is 60% stenosed.   Prox RCA lesion is 70% stenosed.   A stent was successfully placed.   Post intervention, there is a 0% residual stenosis.   Post intervention, there is a 0% residual stenosis. 1.  High-grade disease of proximal LAD and obtuse marginal with RFR positive ostial and proximal to mid right coronary artery disease. 2.  After review with Dr. Marlou Porch, PCI of the right coronary artery was pursued with 2 overlapping drug-eluting stents placed. 3.  Due to dye expenditure, PCI of the proximal LAD and left circumflex will be staged depending on  renal function. 4.  LVEDP of 9 mmHg. 5.  Fick cardiac output of 5.5 L/min and Fick cardiac index of 2.4 L/min/m with a mean RA pressure of 11 mmHg, mean wedge pressure of 18 mmHg, and mean PA pressure of 30 mmHg. 6.  Very difficult cannulation of right coronary artery; please see procedural details.  Recommendation: Given history of atrial fibrillation the patient will be maintained on triple therapy for 1 month and then Plavix and Eliquis for 6 months in total and then Eliquis indefinitely.  Depending on the patient's creatinine staged PCI of the LAD and left circumflex will be pursued.      Assessment & Plan    Mr Nooney is a 77yo man with pAF, DM, CAD who is now s/p PCI to the RCA and LAD this admission.   #NSTEMI #CAD Cont metoprol Cont asa and plavix. After 30 days stop the aspirin and continue plavix in addition to his eliquis. Cont statin  #HFpEF EF 50-55% on this admission echo Volume overloaded on exam Lasix '20mg'$  IV BID ordered, monitor output, may need to uptitrate Keep K>4, Mg>2  #AF Continue eliquis Cont metoprolol   For questions or updates, please contact [place] Please consult [email or web address] for contact info under        Signed, Vickie Epley, MD  04/01/2022, 10:43 AM

## 2022-04-01 NOTE — Progress Notes (Signed)
PROGRESS NOTE   Arthur Wilson  IHK:[id number] DOB: [birth year] DOA: 03/28/2022 PCP: [name], MD   Chief Complaint  Patient presents with   Hypotension  Brief Admission History:  76/M with history of paroxysmal A-fib on Eliquis, type 2 diabetes mellitus, chronic leg edema presented to the ED with low blood pressure at his PCPs office. -Patient reports falling and injuring his right knee in early December, history of ongoing pain for several weeks, saw Dr. Reynaldo Minium last week, imaging was unremarkable, noted to have swelling and asked to see his primary care doctor then.  Went to his PCPs office on 1/2, was noted to be hypotensive and sent to the emergency room.  BP in the 80s, labs in the ED noted white count of 11.5, creatinine 2.7, troponin 3628, lactate 3.1, BNP 531 chest x-ray noted cardiomegaly, otherwise unremarkable -Echo with EF 50-55% with new wall motion abnormalities, mildly reduced RV -LHC 1/4 high-grade disease of proximal LAD and obtuse marginal with RFR positive ostial and proximal to mid right coronary artery disease, PCI of the right coronary artery was pursued with 2 overlapping drug-eluting stents placed. Due to dye expenditure, PCI of the proximal LAD and left circumflex will be staged depending on renal function. -1/5 underwent PCI to proximal LAD   Subjective: Feels fair overall, right knee continues to bother him, denies chest pain or dyspnea   Assessment and Plan:  NSTEMI  -Treated with IV heparin, troponin peaked at 3600 -Creatinine improved, underwent stenting of RCA with 2 overlapping stents 1/4 -1/5 underwent  PCI of proximal LAD  -Plan to manage left circumflex disease medically  -Echo with a EF of 50-55%, new wall motion abnormalities, mildly reduced RV -Continue aspirin/Plavix/metoprolol, statin -Continue triple therapy for 4 weeks followed by Plavix and Eliquis for 6 months followed by Eliquis monotherapy -Increase activity  Acute on chronic diastolic  CHF Hypoalbuminemia, third spacing -2D echo with EF 50-55%, mildly reduced RV function -Right heart cath with preserved cardiac output, wedge elevated at 18 -BP is soft, low-dose IV Lasix today 20 Mg twice daily with albumin, he is 4 L negative thus far, creatinine normalized -GDMT limited by soft BPs -Dopplers negative for DVT -Increase protein supplements, albumin is low, no history of liver disease  Paroxysmal atrial fibrillation -Could be contributing to his CHF as well -Bisoprolol on hold, started on metoprolol now -Eliquis resumed  Right knee pain/contusion -Recent fall, saw orthopedics last week, CT right knee 12/27, No evidence of acute fracture or dislocation.,  Mild tricompartmental degenerative changes noted and small knee joint effusion -Supportive care,  Voltaren gel -Add low-dose Decadron today, unable to use NSAIDs in the setting of triple therapy above -Physical therapy  AKI (presumed) -Likely ATN in the setting of hypotension, now resolved  Lactic Acidosis -resolved after hydration given in ED  Type 2 DM -continue SSI coverage and CBG monitoring  Thrombocytopenia -Improving, recheck CBC in AM  DVT prophylaxis: Eliquis Code Status: Full  Family Communication: Wife at bedside Disposition: Home pending above workup   Consultants:  Cardiology   Procedure LHC 1/4 high-grade disease of proximal LAD and obtuse marginal with RFR positive ostial and proximal to mid right coronary artery disease, PCI of the right coronary artery was pursued with 2 overlapping drug-eluting stents placed.  1/5: PCI to LAD  Antimicrobials:  N/a   Objective: Vitals:   04/01/22 0315 04/01/22 0445 04/01/22 0818 04/01/22 1230  BP: 109/63 101/60 106/73 (!) 90/55  Pulse: 81 84 95 100  Resp:  $'17  16 18  'Q$ Temp: 98.1 F (36.7 C)  98.4 F (36.9 C) 98.1 F (36.7 C)  TempSrc: Oral  Oral Oral  SpO2: 93% 94% 96% 97%  Weight: 110.2 kg     Height:        Intake/Output Summary (Last  24 hours) at 04/01/2022 1309 Last data filed at 04/01/2022 1100 Gross per 24 hour  Intake 515.39 ml  Output 1900 ml  Net -1384.61 ml    Examination:  General exam: Obese male sitting up in the recliner, AAOx3 CVS: S1-S2, irregularly irregular rhythm Lungs: Decreased breath sounds at the bases Abdomen: Soft, nontender, bowel sounds present Extremities: 2+ edema, minimal tenderness on full range of motion of right knee Skin: No rashes, lesions or ulcers. Psychiatry: Anxious  Data Reviewed: I have personally reviewed following labs and imaging studies  CBC: Recent Labs  Lab 03/28/22 1816 03/30/22 1520 03/30/22 1524 03/30/22 1526 03/31/22 0532 04/01/22 0239  WBC 11.5*  --   --   --  6.3 6.4  HGB 13.3 11.9* 12.2* 12.2* 12.3* 11.3*  HCT 40.1 35.0* 36.0* 36.0* 35.4* 33.0*  MCV 92.4  --   --   --  89.8 89.4  PLT 118*  --   --   --  100* 106*    Basic Metabolic Panel: Recent Labs  Lab 03/28/22 1816 03/29/22 1125 03/30/22 0457 03/30/22 1520 03/30/22 1524 03/30/22 1526 03/31/22 0532 04/01/22 0239  NA 139 139 139 140 140 141 137 137  K 3.6 3.3* 3.3* 4.2 4.1 4.1 4.3 4.1  CL 97* 97* 99  --   --   --  105 106  CO2 31 32 27  --   --   --  24 22  GLUCOSE 155* 158* 165*  --   --   --  168* 146*  BUN 90* 74* 50*  --   --   --  31* 30*  CREATININE 2.71* 1.68* 1.44*  --   --   --  1.16 1.19  CALCIUM 8.3* 8.1* 8.2*  --   --   --  8.1* 7.7*  MG  --   --  1.6*  --   --   --   --   --     CBG: Recent Labs  Lab 03/31/22 1215 03/31/22 1847 03/31/22 2117 04/01/22 0850 04/01/22 1212  GLUCAP 149* 165* 158* 188* 245*    Recent Results (from the past 240 hour(s))  Culture, blood (Routine x 2)     Status: None (Preliminary result)   Collection Time: 03/28/22  6:16 PM   Specimen: BLOOD  Result Value Ref Range Status   Specimen Description BLOOD LEFT ANTECUBITAL  Final   Special Requests   Final    BOTTLES DRAWN AEROBIC AND ANAEROBIC Blood Culture results may not be optimal due  to an excessive volume of blood received in culture bottles   Culture   Final    NO GROWTH 4 DAYS Performed at [facility], [address] [place], [place] [address]    Report Status PENDING  Incomplete  Culture, blood (Routine x 2)     Status: None (Preliminary result)   Collection Time: 03/28/22  6:16 PM   Specimen: BLOOD  Result Value Ref Range Status   Specimen Description BLOOD BLOOD RIGHT ARM  Final   Special Requests   Final    BOTTLES DRAWN AEROBIC AND ANAEROBIC Blood Culture results may not be optimal due to an excessive volume of blood received  in culture bottles   Culture   Final    NO GROWTH 4 DAYS Performed at [facility], [address], [place], [place] [address]    Report Status PENDING  Incomplete     Radiology Studies: CARDIAC CATHETERIZATION  Result Date: 04/01/2022   Prox LAD lesion is 75% stenosed.   1st Mrg lesion is 90% stenosed.   Non-stenotic Ost RCA to Prox RCA lesion was previously treated.   Non-stenotic Prox RCA lesion was previously treated.   A stent was successfully placed.   Post intervention, there is a 0% residual stenosis. 1.  Successful PCI of proximal LAD aided by orbital atherectomy with OCT optimization. 2.  Residual high-grade and highly calcified lesion of the obtuse marginal; given the relatively small territory subtended by this vessel and that the patient's symptoms are dyspnea rather than angina, after discussion with Dr. Marlou Porch medical therapy will be pursued for this burden of disease. 3.  LVEDP of 10 mmHg but obscured by widely fluctuating RR intervals due to atrial fibrillation. Recommendation: Triple therapy for ideally 4 weeks then Plavix and Eliquis for 6 months in total then Eliquis monotherapy thereafter.   CARDIAC CATHETERIZATION  Result Date: 03/30/2022   Prox LAD lesion is 75% stenosed.   1st Mrg lesion is 90% stenosed.   Ost RCA to Prox RCA lesion is 60% stenosed.   Prox RCA lesion is 70% stenosed.   A stent was successfully placed.    Post intervention, there is a 0% residual stenosis.   Post intervention, there is a 0% residual stenosis. 1.  High-grade disease of proximal LAD and obtuse marginal with RFR positive ostial and proximal to mid right coronary artery disease. 2.  After review with Dr. Marlou Porch, PCI of the right coronary artery was pursued with 2 overlapping drug-eluting stents placed. 3.  Due to dye expenditure, PCI of the proximal LAD and left circumflex will be staged depending on renal function. 4.  LVEDP of 9 mmHg. 5.  Fick cardiac output of 5.5 L/min and Fick cardiac index of 2.4 L/min/m with a mean RA pressure of 11 mmHg, mean wedge pressure of 18 mmHg, and mean PA pressure of 30 mmHg. 6.  Very difficult cannulation of right coronary artery; please see procedural details. Recommendation: Given history of atrial fibrillation the patient will be maintained on triple therapy for 1 month and then Plavix and Eliquis for 6 months in total and then Eliquis indefinitely.  Depending on the patient's creatinine staged PCI of the LAD and left circumflex will be pursued.    Scheduled Meds:  apixaban  5 mg Oral BID   aspirin  81 mg Oral Daily   atorvastatin  40 mg Oral Daily   clopidogrel  75 mg Oral Q breakfast   dexamethasone  4 mg Oral Q12H   diclofenac Sodium  2 g Topical QID   feeding supplement  1 Container Oral TID BM   furosemide  20 mg Intravenous BID   insulin aspart  0-9 Units Subcutaneous TID WC   lidocaine  1 patch Transdermal Q24H   metoprolol succinate  100 mg Oral BID   pantoprazole  40 mg Oral Q1200   sodium chloride flush  3 mL Intravenous Q12H   sodium chloride flush  3 mL Intravenous Q12H   Continuous Infusions:  sodium chloride     sodium chloride     albumin human 25 g (04/01/22 1002)     LOS: 4 days   Time spent: 40 mins  Domenic Polite, MD   04/01/2022, 1:09 PM

## 2022-04-02 DIAGNOSIS — I214 Non-ST elevation (NSTEMI) myocardial infarction: Secondary | ICD-10-CM | POA: Diagnosis not present

## 2022-04-02 LAB — FERRITIN: Ferritin: 853 ng/mL — ABNORMAL HIGH (ref 24–336)

## 2022-04-02 LAB — CBC
HCT: 27.2 % — ABNORMAL LOW (ref 39.0–52.0)
Hemoglobin: 9.2 g/dL — ABNORMAL LOW (ref 13.0–17.0)
MCH: 30.3 pg (ref 26.0–34.0)
MCHC: 33.8 g/dL (ref 30.0–36.0)
MCV: 89.5 fL (ref 80.0–100.0)
Platelets: 106 10*3/uL — ABNORMAL LOW (ref 150–400)
RBC: 3.04 MIL/uL — ABNORMAL LOW (ref 4.22–5.81)
RDW: 15.5 % (ref 11.5–15.5)
WBC: 4.6 10*3/uL (ref 4.0–10.5)
nRBC: 0 % (ref 0.0–0.2)

## 2022-04-02 LAB — GLUCOSE, CAPILLARY
Glucose-Capillary: 202 mg/dL — ABNORMAL HIGH (ref 70–99)
Glucose-Capillary: 277 mg/dL — ABNORMAL HIGH (ref 70–99)
Glucose-Capillary: 218 mg/dL — ABNORMAL HIGH (ref 70–99)
Glucose-Capillary: 242 mg/dL — ABNORMAL HIGH (ref 70–99)

## 2022-04-02 LAB — RETICULOCYTES
Immature Retic Fract: 21.5 % — ABNORMAL HIGH (ref 2.3–15.9)
RBC.: 3.32 MIL/uL — ABNORMAL LOW (ref 4.22–5.81)
Retic Count, Absolute: 93.3 10*3/uL (ref 19.0–186.0)
Retic Ct Pct: 2.8 % (ref 0.4–3.1)

## 2022-04-02 LAB — BASIC METABOLIC PANEL
Anion gap: 8 (ref 5–15)
BUN: 34 mg/dL — ABNORMAL HIGH (ref 8–23)
CO2: 23 mmol/L (ref 22–32)
Calcium: 7.8 mg/dL — ABNORMAL LOW (ref 8.9–10.3)
Chloride: 100 mmol/L (ref 98–111)
Creatinine, Ser: 0.99 mg/dL (ref 0.61–1.24)
GFR, Estimated: >60 mL/min (ref >60)
Glucose, Bld: 266 mg/dL — ABNORMAL HIGH (ref 70–99)
Potassium: 4 mmol/L (ref 3.5–5.1)
Sodium: 131 mmol/L — ABNORMAL LOW (ref 135–145)

## 2022-04-02 LAB — CULTURE, BLOOD (ROUTINE X 2)
Culture: NO GROWTH
Culture: NO GROWTH

## 2022-04-02 LAB — IRON AND TIBC
Iron: 102 ug/dL (ref 45–182)
Saturation Ratios: 52 % — ABNORMAL HIGH (ref 17.9–39.5)
TIBC: 197 ug/dL — ABNORMAL LOW (ref 250–450)
UIBC: 95 ug/dL

## 2022-04-02 LAB — MAGNESIUM: Magnesium: 1.8 mg/dL (ref 1.7–2.4)

## 2022-04-02 LAB — FOLATE: Folate: 11.6 ng/mL (ref >5.9)

## 2022-04-02 LAB — VITAMIN B12: Vitamin B-12: 296 pg/mL (ref 180–914)

## 2022-04-02 MED ORDER — FUROSEMIDE 10 MG/ML IJ SOLN
40.0000 mg | Freq: Two times a day (BID) | INTRAMUSCULAR | Status: DC
  Administered 2022-04-02 – 2022-04-03 (×4): 40 mg via INTRAVENOUS
  Filled 2022-04-02 (×5): qty 4

## 2022-04-02 MED ORDER — DEXAMETHASONE 2 MG PO TABS
2.0000 mg | ORAL_TABLET | Freq: Two times a day (BID) | ORAL | Status: AC
  Administered 2022-04-02 (×2): 2 mg via ORAL
  Filled 2022-04-02 (×2): qty 1

## 2022-04-02 MED ORDER — INSULIN GLARGINE-YFGN 100 UNIT/ML ~~LOC~~ SOLN
10.0000 [IU] | Freq: Every day | SUBCUTANEOUS | Status: DC
  Administered 2022-04-02 – 2022-04-04 (×3): 10 [IU] via SUBCUTANEOUS
  Filled 2022-04-02 (×3): qty 0.1

## 2022-04-02 MED ORDER — TRAZODONE HCL 50 MG PO TABS
50.0000 mg | ORAL_TABLET | Freq: Every day | ORAL | Status: DC
  Administered 2022-04-02: 50 mg via ORAL
  Filled 2022-04-02: qty 1

## 2022-04-02 NOTE — Progress Notes (Signed)
Mobility Specialist - Progress Note   04/02/22 1500  Mobility  Activity Transferred from bed to chair  Level of Assistance Contact guard assist, steadying assist  Assistive Device Front wheel walker  Distance Ambulated (ft) 4 ft  Activity Response Tolerated well  Mobility Referral Yes  $Mobility charge 1 Mobility    Pt received in bed agreeable to mobility. No complaints throughout. Left in recliner w/ call bell in reach and all needs met.   [place] Specialist Please contact via SecureChat or Rehab office at [phone]

## 2022-04-02 NOTE — Progress Notes (Addendum)
PROGRESS NOTE   Arthur Wilson  XNT:[id number] DOB: 1945-12-12 DOA: 03/28/2022 PCP: [name], MD   Chief Complaint  Patient presents with   Hypotension  Brief Admission History:  76/M with history of paroxysmal A-fib on Eliquis, type 2 diabetes mellitus, chronic leg edema presented to the ED with low blood pressure at his PCPs office. -Patient reports falling and injuring his right knee in early December, history of ongoing pain for several weeks, saw Dr. Reynaldo Minium last week, imaging was unremarkable, noted to have swelling and asked to see his primary care doctor then.  Went to his PCPs office on 1/2, was noted to be hypotensive and sent to the emergency room.  BP in the 80s, labs in the ED noted white count of 11.5, creatinine 2.7, troponin 3628, lactate 3.1, BNP 531 chest x-ray noted cardiomegaly, otherwise unremarkable -Echo with EF 50-55% with new wall motion abnormalities, mildly reduced RV -LHC 1/4 high-grade disease of proximal LAD and obtuse marginal with RFR positive ostial and proximal to mid right coronary artery disease, PCI of the right coronary artery was pursued with 2 overlapping drug-eluting stents placed. Due to dye expenditure, PCI of the proximal LAD and left circumflex will be staged depending on renal function. -1/5 underwent PCI to proximal LAD   Subjective: Feels better overall, knee slightly better, swelling improving  Assessment and Plan:  NSTEMI  -Treated with IV heparin, troponin peaked at 3600 -Creatinine improved, underwent stenting of RCA with 2 overlapping stents 1/4 -1/5 underwent  PCI of proximal LAD, plan to manage left circumflex disease medically  -Echo with a EF of 50-55%, new wall motion abnormalities, mildly reduced RV -Continue aspirin/Plavix/metoprolol, statin -Continue triple therapy for 4 weeks followed by Plavix and Eliquis for 6 months followed by Eliquis monotherapy -Increase activity  Acute on chronic diastolic CHF Hypoalbuminemia,  third spacing -2D echo with EF 50-55%, mildly reduced RV function -Right heart cath with preserved cardiac output, wedge elevated at 18 -Diuresed with IV Lasix, he is 5.5 L negative, given IV albumin as well yesterday, increase Lasix to 40 Mg twice daily, blood pressure more stable, creatinine normalized -GDMT limited by soft BPs -Dopplers negative for DVT -Increase protein supplements, albumin is low, no history of liver disease  Paroxysmal atrial fibrillation -Could be contributing to his CHF as well -Bisoprolol on hold, started on metoprolol -Eliquis resumed  Right knee pain/contusion -Recent fall, saw orthopedics last week, CT right knee 12/27, No evidence of acute fracture or dislocation.,  Mild tricompartmental degenerative changes noted and small knee joint effusion -Supportive care,  Voltaren gel -Repeat low-dose Decadron today, unable to use NSAIDs in the setting of triple therapy above -Physical therapy  Normocytic anemia -Hemoglobin slightly lower, patient denies overt bleeding, check anemia panel, continue to trend  AKI (presumed) -Likely ATN in the setting of hypotension, now resolved  Lactic Acidosis -resolved after hydration given in ED  Type 2 DM -continue SSI coverage and CBG monitoring  Thrombocytopenia -Improving, recheck CBC in AM  DVT prophylaxis: Eliquis Code Status: Full  Family Communication: Wife at bedside yesterday Disposition: Home in 1 to 2 days   Consultants:  Cardiology   Procedure LHC 1/4 high-grade disease of proximal LAD and obtuse marginal with RFR positive ostial and proximal to mid right coronary artery disease, PCI of the right coronary artery was pursued with 2 overlapping drug-eluting stents placed.  1/5: PCI to LAD  Antimicrobials:  N/a   Objective: Vitals:   04/01/22 2350 04/02/22 0338 04/02/22 0815 04/02/22  1119  BP: 107/63 123/69 107/66 112/72  Pulse:  (!) 115 98 99  Resp: '18 18 19 17  '$ Temp:  97.8 F (36.6 C) 98 F  (36.7 C) 97.8 F (36.6 C)  TempSrc:  Oral Oral Oral  SpO2: 95% 95% 97% 98%  Weight:  106.4 kg    Height:        Intake/Output Summary (Last 24 hours) at 04/02/2022 1259 Last data filed at 04/02/2022 1256 Gross per 24 hour  Intake 406.11 ml  Output 2650 ml  Net -2243.89 ml    Examination:  General exam: Obese pleasant male sitting up in bed, AAOx3, no distress HEENT: No JVD CVS: S1-S2, irregularly irregular rhythm Lungs: Decreased breath sounds at the bases Abdomen: Soft, nontender, bowel sounds present Extremities: 1+ edema mild right knee tenderness with full range of motion  Skin: No rashes, lesions or ulcers. Psychiatry: Anxious  Data Reviewed: I have personally reviewed following labs and imaging studies  CBC: Recent Labs  Lab 03/28/22 1816 03/30/22 1520 03/30/22 1524 03/30/22 1526 03/31/22 0532 04/01/22 0239 04/02/22 0143  WBC 11.5*  --   --   --  6.3 6.4 4.6  HGB 13.3   < > 12.2* 12.2* 12.3* 11.3* 9.2*  HCT 40.1   < > 36.0* 36.0* 35.4* 33.0* 27.2*  MCV 92.4  --   --   --  89.8 89.4 89.5  PLT 118*  --   --   --  100* 106* 106*   < > = values in this interval not displayed.    Basic Metabolic Panel: Recent Labs  Lab 03/29/22 1125 03/30/22 0457 03/30/22 1520 03/30/22 1524 03/30/22 1526 03/31/22 0532 04/01/22 0239 04/02/22 0143  NA 139 139   < > 140 141 137 137 131*  K 3.3* 3.3*   < > 4.1 4.1 4.3 4.1 4.0  CL 97* 99  --   --   --  105 106 100  CO2 32 27  --   --   --  '24 22 23  '$ GLUCOSE 158* 165*  --   --   --  168* 146* 266*  BUN 74* 50*  --   --   --  31* 30* 34*  CREATININE 1.68* 1.44*  --   --   --  1.16 1.19 0.99  CALCIUM 8.1* 8.2*  --   --   --  8.1* 7.7* 7.8*  MG  --  1.6*  --   --   --   --   --  1.8   < > = values in this interval not displayed.    CBG: Recent Labs  Lab 04/01/22 1212 04/01/22 1625 04/01/22 2057 04/02/22 0813 04/02/22 1118  GLUCAP 245* 203* 204* 202* 277*    Recent Results (from the past 240 hour(s))  Culture,  blood (Routine x 2)     Status: None   Collection Time: 03/28/22  6:16 PM   Specimen: BLOOD  Result Value Ref Range Status   Specimen Description BLOOD LEFT ANTECUBITAL  Final   Special Requests   Final    BOTTLES DRAWN AEROBIC AND ANAEROBIC Blood Culture results may not be optimal due to an excessive volume of blood received in culture bottles   Culture   Final    NO GROWTH 5 DAYS Performed at [facility], [address] [place], [place] [address]    Report Status 04/02/2022 FINAL  Final  Culture, blood (Routine x 2)     Status: None  Collection Time: 03/28/22  6:16 PM   Specimen: BLOOD  Result Value Ref Range Status   Specimen Description BLOOD BLOOD RIGHT ARM  Final   Special Requests   Final    BOTTLES DRAWN AEROBIC AND ANAEROBIC Blood Culture results may not be optimal due to an excessive volume of blood received in culture bottles   Culture   Final    NO GROWTH 5 DAYS Performed at [facility], [address], [place], [place] [address]    Report Status 04/02/2022 FINAL  Final     Radiology Studies: CARDIAC CATHETERIZATION  Result Date: 04/01/2022   Prox LAD lesion is 75% stenosed.   1st Mrg lesion is 90% stenosed.   Non-stenotic Ost RCA to Prox RCA lesion was previously treated.   Non-stenotic Prox RCA lesion was previously treated.   A stent was successfully placed.   Post intervention, there is a 0% residual stenosis. 1.  Successful PCI of proximal LAD aided by orbital atherectomy with OCT optimization. 2.  Residual high-grade and highly calcified lesion of the obtuse marginal; given the relatively small territory subtended by this vessel and that the patient's symptoms are dyspnea rather than angina, after discussion with Dr. Marlou Porch medical therapy will be pursued for this burden of disease. 3.  LVEDP of 10 mmHg but obscured by widely fluctuating RR intervals due to atrial fibrillation. Recommendation: Triple therapy for ideally 4 weeks then Plavix and Eliquis for 6 months in  total then Eliquis monotherapy thereafter.    Scheduled Meds:  apixaban  5 mg Oral BID   aspirin  81 mg Oral Daily   atorvastatin  40 mg Oral Daily   clopidogrel  75 mg Oral Q breakfast   dexamethasone  2 mg Oral Q12H   diclofenac Sodium  2 g Topical QID   feeding supplement  1 Container Oral TID BM   furosemide  40 mg Intravenous BID   insulin aspart  0-9 Units Subcutaneous TID WC   insulin glargine-yfgn  10 Units Subcutaneous Daily   lidocaine  1 patch Transdermal Q24H   metoprolol succinate  100 mg Oral BID   pantoprazole  40 mg Oral Q1200   sodium chloride flush  3 mL Intravenous Q12H   sodium chloride flush  3 mL Intravenous Q12H   traZODone  50 mg Oral QHS   Continuous Infusions:  sodium chloride     sodium chloride       LOS: 5 days   Time spent: 40 mins  Domenic Polite, MD   04/02/2022, 12:59 PM

## 2022-04-02 NOTE — Progress Notes (Signed)
Rounding Note    Patient Name: Arthur Wilson Date of Encounter: 04/02/2022  [place] Cardiologist: Rozann Lesches, MD   Subjective   NAEO. S/p staged PCI to the LAD Friday.. Family at bedside.  Significant diuresis overnight.  Inpatient Medications    Scheduled Meds:  apixaban  5 mg Oral BID   aspirin  81 mg Oral Daily   atorvastatin  40 mg Oral Daily   clopidogrel  75 mg Oral Q breakfast   dexamethasone  2 mg Oral Q12H   diclofenac Sodium  2 g Topical QID   feeding supplement  1 Container Oral TID BM   furosemide  40 mg Intravenous BID   insulin aspart  0-9 Units Subcutaneous TID WC   insulin glargine-yfgn  10 Units Subcutaneous Daily   lidocaine  1 patch Transdermal Q24H   metoprolol succinate  100 mg Oral BID   pantoprazole  40 mg Oral Q1200   sodium chloride flush  3 mL Intravenous Q12H   sodium chloride flush  3 mL Intravenous Q12H   traZODone  50 mg Oral QHS   Continuous Infusions:  sodium chloride     sodium chloride     PRN Meds: sodium chloride, sodium chloride, acetaminophen, fentaNYL (SUBLIMAZE) injection, melatonin, naphazoline-glycerin, ondansetron (ZOFRAN) IV, polyvinyl alcohol, sodium chloride flush, sodium chloride flush   Vital Signs    Vitals:   04/01/22 2350 04/02/22 0338 04/02/22 0815 04/02/22 1119  BP: 107/63 123/69 107/66 112/72  Pulse:  (!) 115 98 99  Resp: '18 18 19 17  '$ Temp:  97.8 F (36.6 C) 98 F (36.7 C) 97.8 F (36.6 C)  TempSrc:  Oral Oral Oral  SpO2: 95% 95% 97% 98%  Weight:  106.4 kg    Height:        Intake/Output Summary (Last 24 hours) at 04/02/2022 1154 Last data filed at 04/02/2022 1031 Gross per 24 hour  Intake 646.11 ml  Output 1850 ml  Net -1203.89 ml       04/02/2022    3:38 AM 04/01/2022    3:15 AM 03/31/2022    3:54 AM  Last 3 Weights  Weight (lbs) 234 lb 9.1 oz 242 lb 15.2 oz 239 lb 3.2 oz  Weight (kg) 106.4 kg 110.2 kg 108.5 kg      Telemetry    AF - Personally Reviewed  ECG     Personally Reviewed  Physical Exam   GEN: No acute distress.   Neck: No JVD Cardiac: irregularly irregular, no murmurs, rubs, or gallops. Radial access sites soft with good pulses. Significant bruising in bilateral upper extremities. Respiratory: Clear to auscultation bilaterally. GI: Soft, nontender, non-distended  MS: improved 1-2+ pitting bilateral LE edema; No deformity. Neuro:  Nonfocal  Psych: Normal affect   Labs    High Sensitivity Troponin:   Recent Labs  Lab 03/28/22 1816 03/28/22 2029 03/29/22 1125 03/30/22 0457  TROPONINIHS 3,628* 3,084* 3,058* 2,688*      Chemistry Recent Labs  Lab 03/30/22 0457 03/30/22 1520 03/31/22 0532 04/01/22 0239 04/02/22 0143  NA 139   < > 137 137 131*  K 3.3*   < > 4.3 4.1 4.0  CL 99  --  105 106 100  CO2 27  --  '24 22 23  '$ GLUCOSE 165*  --  168* 146* 266*  BUN 50*  --  31* 30* 34*  CREATININE 1.44*  --  1.16 1.19 0.99  CALCIUM 8.2*  --  8.1* 7.7* 7.8*  MG 1.6*  --   --   --  1.8  PROT  --   --  4.6*  --   --   ALBUMIN  --   --  2.2*  --   --   AST  --   --  33  --   --   ALT  --   --  75*  --   --   ALKPHOS  --   --  32*  --   --   BILITOT  --   --  1.7*  --   --   GFRNONAA 51*  --  >60 >60 >60  ANIONGAP 13  --  '8 9 8   '$ < > = values in this interval not displayed.     Lipids No results for input(s): "CHOL", "TRIG", "HDL", "LABVLDL", "LDLCALC", "CHOLHDL" in the last 168 hours.  Hematology Recent Labs  Lab 03/31/22 0532 04/01/22 0239 04/02/22 0143 04/02/22 1005  WBC 6.3 6.4 4.6  --   RBC 3.94* 3.69* 3.04* 3.32*  HGB 12.3* 11.3* 9.2*  --   HCT 35.4* 33.0* 27.2*  --   MCV 89.8 89.4 89.5  --   MCH 31.2 30.6 30.3  --   MCHC 34.7 34.2 33.8  --   RDW 15.4 15.7* 15.5  --   PLT 100* 106* 106*  --     Thyroid No results for input(s): "TSH", "FREET4" in the last 168 hours.  BNP Recent Labs  Lab 03/28/22 1816  BNP 531.0*     DDimer No results for input(s): "DDIMER" in the last 168 hours.   Radiology     CARDIAC CATHETERIZATION  Result Date: 04/01/2022   Prox LAD lesion is 75% stenosed.   1st Mrg lesion is 90% stenosed.   Non-stenotic Ost RCA to Prox RCA lesion was previously treated.   Non-stenotic Prox RCA lesion was previously treated.   A stent was successfully placed.   Post intervention, there is a 0% residual stenosis. 1.  Successful PCI of proximal LAD aided by orbital atherectomy with OCT optimization. 2.  Residual high-grade and highly calcified lesion of the obtuse marginal; given the relatively small territory subtended by this vessel and that the patient's symptoms are dyspnea rather than angina, after discussion with Dr. Marlou Porch medical therapy will be pursued for this burden of disease. 3.  LVEDP of 10 mmHg but obscured by widely fluctuating RR intervals due to atrial fibrillation. Recommendation: Triple therapy for ideally 4 weeks then Plavix and Eliquis for 6 months in total then Eliquis monotherapy thereafter.      Assessment & Plan    Mr Gopal is a 77yo man with pAF, DM, CAD who is now s/p PCI to the RCA and LAD this admission.   #NSTEMI #CAD Cont metoprol Cont asa and plavix. After 30 days stop the aspirin and continue plavix in addition to his eliquis. Cont statin  #HFpEF EF 50-55% on this admission echo Volume overloaded on exam Lasix '20mg'$  IV BID ordered, monitor output. Continue this dose given good response. Keep K>4, Mg>2  #AF Continue eliquis Cont metoprolol   For questions or updates, please contact [place] Please consult [email or web address] for contact info under        Signed, Vickie Epley, MD  04/02/2022, 11:54 AM

## 2022-04-03 ENCOUNTER — Encounter (HOSPITAL_COMMUNITY): Payer: Self-pay | Admitting: Internal Medicine

## 2022-04-03 ENCOUNTER — Other Ambulatory Visit (HOSPITAL_COMMUNITY): Payer: Self-pay

## 2022-04-03 DIAGNOSIS — I214 Non-ST elevation (NSTEMI) myocardial infarction: Secondary | ICD-10-CM | POA: Diagnosis not present

## 2022-04-03 LAB — CBC
HCT: 29.3 % — ABNORMAL LOW (ref 39.0–52.0)
Hemoglobin: 10.5 g/dL — ABNORMAL LOW (ref 13.0–17.0)
MCH: 31.4 pg (ref 26.0–34.0)
MCHC: 35.8 g/dL (ref 30.0–36.0)
MCV: 87.7 fL (ref 80.0–100.0)
Platelets: 152 10*3/uL (ref 150–400)
RBC: 3.34 MIL/uL — ABNORMAL LOW (ref 4.22–5.81)
RDW: 15.5 % (ref 11.5–15.5)
WBC: 8.3 10*3/uL (ref 4.0–10.5)
nRBC: 0 % (ref 0.0–0.2)

## 2022-04-03 LAB — GLUCOSE, CAPILLARY
Glucose-Capillary: 165 mg/dL — ABNORMAL HIGH (ref 70–99)
Glucose-Capillary: 239 mg/dL — ABNORMAL HIGH (ref 70–99)
Glucose-Capillary: 162 mg/dL — ABNORMAL HIGH (ref 70–99)

## 2022-04-03 LAB — BASIC METABOLIC PANEL
Anion gap: 9 (ref 5–15)
BUN: 34 mg/dL — ABNORMAL HIGH (ref 8–23)
CO2: 25 mmol/L (ref 22–32)
Calcium: 8.1 mg/dL — ABNORMAL LOW (ref 8.9–10.3)
Chloride: 100 mmol/L (ref 98–111)
Creatinine, Ser: 0.98 mg/dL (ref 0.61–1.24)
GFR, Estimated: >60 mL/min (ref >60)
Glucose, Bld: 170 mg/dL — ABNORMAL HIGH (ref 70–99)
Potassium: 3.6 mmol/L (ref 3.5–5.1)
Sodium: 134 mmol/L — ABNORMAL LOW (ref 135–145)

## 2022-04-03 MED ORDER — VIPERSLIDE LUBRICANT OPTIME: TOPICAL | Status: DC | PRN

## 2022-04-03 MED ORDER — SENNOSIDES-DOCUSATE SODIUM 8.6-50 MG PO TABS
1.0000 | ORAL_TABLET | Freq: Two times a day (BID) | ORAL | Status: DC
  Administered 2022-04-03 – 2022-04-04 (×2): 1 via ORAL
  Filled 2022-04-03 (×3): qty 1

## 2022-04-03 MED ORDER — EMPAGLIFLOZIN 10 MG PO TABS
10.0000 mg | ORAL_TABLET | Freq: Every day | ORAL | Status: DC
  Administered 2022-04-03 – 2022-04-04 (×2): 10 mg via ORAL
  Filled 2022-04-03 (×2): qty 1

## 2022-04-03 MED ORDER — POLYETHYLENE GLYCOL 3350 17 G PO PACK: 17.0000 g | PACK | Freq: Every day | ORAL | Status: DC | PRN

## 2022-04-03 MED ORDER — DEXAMETHASONE 2 MG PO TABS
2.0000 mg | ORAL_TABLET | Freq: Every day | ORAL | Status: AC
  Administered 2022-04-03: 2 mg via ORAL
  Filled 2022-04-03: qty 1

## 2022-04-03 MED ORDER — BISACODYL 10 MG RE SUPP
10.0000 mg | Freq: Once | RECTAL | Status: DC
  Filled 2022-04-03: qty 1

## 2022-04-03 MED ORDER — POTASSIUM CHLORIDE CRYS ER 20 MEQ PO TBCR
40.0000 meq | EXTENDED_RELEASE_TABLET | Freq: Once | ORAL | Status: AC
  Administered 2022-04-03: 40 meq via ORAL
  Filled 2022-04-03: qty 2

## 2022-04-03 MED FILL — Nitroglycerin IV Soln 100 MCG/ML in D5W: INTRA_ARTERIAL | Qty: 10 | Status: AC

## 2022-04-03 NOTE — Progress Notes (Signed)
Mobility Specialist - Progress Note   04/03/22 0935  Mobility  Activity Transferred from bed to chair  Level of Assistance Contact guard assist, steadying assist  Assistive Device Front wheel walker  Activity Response Tolerated well  Mobility Referral Yes  $Mobility charge 1 Mobility   Pt was received in bed and agreeable to mobility. Pt c/o LLE knee pain. Pt was left in chair with all needs met.   Franki Monte  Mobility Specialist Please contact via Solicitor or Rehab office at [phone]

## 2022-04-03 NOTE — Progress Notes (Signed)
CARDIAC REHAB PHASE I    Pt resting in bed feeling well today. Working with PT to increase ambulation needs. Pt reports right knee pain has not improved and is excruciating during ambulation. Reviewed education provided, no questions or concerns. Will continue to follow.   [id number]  Vanessa Barbara, RN BSN 04/03/2022 1:44 PM

## 2022-04-03 NOTE — Progress Notes (Signed)
PROGRESS NOTE   JAVARES KAUFHOLD  MWU:[id number] DOB: October 24, 1945 DOA: 03/28/2022 PCP: [name], MD   Chief Complaint  Patient presents with   Hypotension  Brief Admission History:  76/M with history of paroxysmal A-fib on Eliquis, type 2 diabetes mellitus, chronic leg edema presented to the ED with low blood pressure at his PCPs office. -Patient reports falling and injuring his right knee in early December, history of ongoing pain for several weeks, saw Dr. Reynaldo Minium last week, imaging was unremarkable, noted to have swelling and asked to see his primary care doctor then.  Went to his PCPs office on 1/2, was noted to be hypotensive and sent to the emergency room.  BP in the 80s, labs in the ED noted white count of 11.5, creatinine 2.7, troponin 3628, lactate 3.1, BNP 531 chest x-ray noted cardiomegaly, otherwise unremarkable -Echo with EF 50-55% with new wall motion abnormalities, mildly reduced RV -LHC 1/4 high-grade disease of proximal LAD and obtuse marginal with RFR positive ostial and proximal to mid right coronary artery disease, PCI of the right coronary artery was pursued with 2 overlapping drug-eluting stents placed. Due to dye expenditure, PCI of the proximal LAD and left circumflex will be staged depending on renal function. -1/5 underwent PCI to proximal LAD   Subjective: Feels better overall, knee slightly better, swelling improving  Assessment and Plan:  NSTEMI  -Treated with IV heparin, troponin peaked at 3600 -Creatinine improved, underwent stenting of RCA with 2 overlapping stents 1/4 -1/5 underwent  PCI of proximal LAD, plan to manage left circumflex disease medically  -Echo with a EF of 50-55%, new wall motion abnormalities, mildly reduced RV -Continue aspirin/Plavix/metoprolol, statin -Continue triple therapy for 4 weeks followed by Plavix and Eliquis for 6 months followed by Eliquis monotherapy -Increase activity  Acute on chronic diastolic CHF Hypoalbuminemia,  third spacing -2D echo with EF 50-55%, mildly reduced RV function -Right heart cath with preserved cardiac output, wedge elevated at 18 -Diuresed with IV Lasix, he is 8.1 L negative, continue IV Lasix today  -Blood pressure more stable, add Jardiance -GDMT limited by soft BPs -Dopplers negative for DVT -Increase protein supplements, albumin is low, no history of liver disease -Increase activity  Paroxysmal atrial fibrillation -Could be contributing to his CHF as well -Bisoprolol on hold, started on metoprolol -Eliquis resumed  Right knee pain/contusion -Recent fall, saw orthopedics last week, CT right knee 12/27, No evidence of acute fracture or dislocation.,  Mild tricompartmental degenerative changes noted and small knee joint effusion -Supportive care,  Voltaren gel -Taper off Decadron, unable to use NSAIDs in the setting of triple therapy above -Physical therapy  Normocytic anemia -Hemoglobin slightly lower, patient denies overt bleeding, anemia panel with adequate iron stores  AKI (presumed) -Likely ATN in the setting of hypotension, now resolved  Lactic Acidosis -resolved after hydration given in ED  Type 2 DM -continue SSI coverage and CBG monitoring -Add glargine  Thrombocytopenia -Improving, recheck CBC in AM  DVT prophylaxis: Eliquis Code Status: Full  Family Communication: Wife at bedside yesterday Disposition: Home in 1 to 2 days   Consultants:  Cardiology   Procedure LHC 1/4 high-grade disease of proximal LAD and obtuse marginal with RFR positive ostial and proximal to mid right coronary artery disease, PCI of the right coronary artery was pursued with 2 overlapping drug-eluting stents placed.  1/5: PCI to LAD  Antimicrobials:  N/a   Objective: Vitals:   04/02/22 2056 04/03/22 0426 04/03/22 0753 04/03/22 1010  BP: 103/78 118/75  114/73   Pulse:  65 99 78  Resp: '19 18 18   '$ Temp: 98 F (36.7 C) 98.1 F (36.7 C) 98 F (36.7 C)   TempSrc: Oral  Oral Oral   SpO2: 98%  98%   Weight:  102.8 kg    Height:        Intake/Output Summary (Last 24 hours) at 04/03/2022 1156 Last data filed at 04/03/2022 0435 Gross per 24 hour  Intake --  Output 2657 ml  Net -2657 ml    Examination:  General exam: Obese pleasant male sitting up in bed, AAOx3, no distress HEENT: Positive JVD CVS: S1-S2, irregularly irregular rhythm Lungs: Decreased breath sounds at the bases Abdomen: Soft, nontender, bowel sounds present Extremities: 1+ edema,  mild right knee tenderness with full range of motion  Skin: No rashes, lesions or ulcers. Psychiatry: Anxious  Data Reviewed: I have personally reviewed following labs and imaging studies  CBC: Recent Labs  Lab 03/28/22 1816 03/30/22 1520 03/30/22 1526 03/31/22 0532 04/01/22 0239 04/02/22 0143 04/03/22 0206  WBC 11.5*  --   --  6.3 6.4 4.6 8.3  HGB 13.3   < > 12.2* 12.3* 11.3* 9.2* 10.5*  HCT 40.1   < > 36.0* 35.4* 33.0* 27.2* 29.3*  MCV 92.4  --   --  89.8 89.4 89.5 87.7  PLT 118*  --   --  100* 106* 106* 152   < > = values in this interval not displayed.    Basic Metabolic Panel: Recent Labs  Lab 03/30/22 0457 03/30/22 1520 03/30/22 1526 03/31/22 0532 04/01/22 0239 04/02/22 0143 04/03/22 0206  NA 139   < > 141 137 137 131* 134*  K 3.3*   < > 4.1 4.3 4.1 4.0 3.6  CL 99  --   --  105 106 100 100  CO2 27  --   --  '24 22 23 25  '$ GLUCOSE 165*  --   --  168* 146* 266* 170*  BUN 50*  --   --  31* 30* 34* 34*  CREATININE 1.44*  --   --  1.16 1.19 0.99 0.98  CALCIUM 8.2*  --   --  8.1* 7.7* 7.8* 8.1*  MG 1.6*  --   --   --   --  1.8  --    < > = values in this interval not displayed.    CBG: Recent Labs  Lab 04/02/22 0813 04/02/22 1118 04/02/22 1741 04/02/22 2116 04/03/22 0752  GLUCAP 202* 277* 218* 242* 165*    Recent Results (from the past 240 hour(s))  Culture, blood (Routine x 2)     Status: None   Collection Time: 03/28/22  6:16 PM   Specimen: BLOOD  Result Value Ref  Range Status   Specimen Description BLOOD LEFT ANTECUBITAL  Final   Special Requests   Final    BOTTLES DRAWN AEROBIC AND ANAEROBIC Blood Culture results may not be optimal due to an excessive volume of blood received in culture bottles   Culture   Final    NO GROWTH 5 DAYS Performed at [facility], [address] [place], [place] [address]    Report Status 04/02/2022 FINAL  Final  Culture, blood (Routine x 2)     Status: None   Collection Time: 03/28/22  6:16 PM   Specimen: BLOOD  Result Value Ref Range Status   Specimen Description BLOOD BLOOD RIGHT ARM  Final   Special Requests   Final  BOTTLES DRAWN AEROBIC AND ANAEROBIC Blood Culture results may not be optimal due to an excessive volume of blood received in culture bottles   Culture   Final    NO GROWTH 5 DAYS Performed at [facility], [address], [place], [place] [address]    Report Status 04/02/2022 FINAL  Final     Radiology Studies: No results found.  Scheduled Meds:  apixaban  5 mg Oral BID   aspirin  81 mg Oral Daily   atorvastatin  40 mg Oral Daily   bisacodyl  10 mg Rectal Once   clopidogrel  75 mg Oral Q breakfast   diclofenac Sodium  2 g Topical QID   feeding supplement  1 Container Oral TID BM   furosemide  40 mg Intravenous BID   insulin aspart  0-9 Units Subcutaneous TID WC   insulin glargine-yfgn  10 Units Subcutaneous Daily   lidocaine  1 patch Transdermal Q24H   metoprolol succinate  100 mg Oral BID   pantoprazole  40 mg Oral Q1200   senna-docusate  1 tablet Oral BID   sodium chloride flush  3 mL Intravenous Q12H   sodium chloride flush  3 mL Intravenous Q12H   traZODone  50 mg Oral QHS   Continuous Infusions:  sodium chloride     sodium chloride       LOS: 6 days   Time spent: 40 mins  Domenic Polite, MD   04/03/2022, 11:56 AM

## 2022-04-03 NOTE — Care Management Important Message (Signed)
Important Message  Patient Details  Name: Arthur Wilson MRN: [id number] Date of Birth: 10-11-1945   Medicare Important Message Given:  Yes     [name] 04/03/2022, 8:11 AM

## 2022-04-03 NOTE — Progress Notes (Signed)
   Heart Failure Stewardship Pharmacist Progress Note   PCP: [name], MD PCP-Cardiologist: Rozann Lesches, MD    HPI:  77 yo M with PMH of afib, HTN, and OSA.  Presented to the ED on 1/2 with hypotension, LE edema, and generalized weakness and confusion. Recently went to orthopedic for knee pain and was started on furosemide for edema. BNP, troponin, and lactic acid elevated on arrival. Found to be in afib RVR. CXR showed cardiomegaly without acute cardiopulmonary process. ECHO 1/3 showed LVEF 50-55%, regional wall motion abnormalities, mild LVH, RV mildly reduced. Taken for [facility] on 1/4 and found to have high grade stenosis of RCA s/p 2 overlapping DES. Was also found to have disease in LAD, s/p staged PCI with DES and atherectomy on 1/5. RA 11, PA 30, wedge 18, CO 5.6, CI 2.5.  Current HF Medications: Diuretic: furosemide 40 mg IV BID Beta Blocker: metoprolol XL 100 mg BID  Prior to admission HF Medications: Diuretic: furosemide 40 mg daily Beta blocker: bisoprolol 5 mg daily ACE/ARB/ARNI: olmesartan 20 mg daily  Pertinent Lab Values: Serum creatinine 0.98, BUN 34, Potassium 3.6, Sodium 134, BNP 531.0, Magnesium 1.8, A1c 7.4   Vital Signs: Weight: 226 lbs (admission weight: 246 lbs) Blood pressure: 110/70s  Heart rate: 80-100s  I/O: -2.9L yesterday; net -8.1L  Medication Assistance / Insurance Benefits Check: Does the patient have prescription insurance?  Yes Type of insurance plan: [place] [employer]  Outpatient Pharmacy:  Prior to admission outpatient pharmacy: [facility] Is the patient willing to use [place] pharmacy at discharge? Yes Is the patient willing to transition their outpatient pharmacy to utilize a [facility] outpatient pharmacy?   No    Assessment: 1. Acute on chronic diastolic CHF (LVEF 46-28%), due to ICM. NYHA class III symptoms. - Continue furosemide 40 mg IV BID. Strict I/Os and daily weights. Keep K>4 and Mag>2 - Continue metoprolol XL  100 mg BID. Was on bisoprolol PTA.  - Consider adding SGLT2i and MRA prior to discharge pending renal function and BP.   Plan: 1) Medication changes recommended at this time: - Continue IV lasix   2) Patient assistance: - Farxiga copay $45  3)  Education  - To be completed prior to discharge  Kerby Nora, PharmD, BCPS Heart Failure Stewardship Pharmacist Phone [phone]

## 2022-04-03 NOTE — Progress Notes (Signed)
Rounding Note    Patient Name: Arthur SCHELLHORN Date of Encounter: 04/03/2022  [place] Cardiologist: Rozann Lesches, MD   Subjective   No CP or dyspnea  Inpatient Medications    Scheduled Meds:  apixaban  5 mg Oral BID   aspirin  81 mg Oral Daily   atorvastatin  40 mg Oral Daily   clopidogrel  75 mg Oral Q breakfast   diclofenac Sodium  2 g Topical QID   feeding supplement  1 Container Oral TID BM   furosemide  40 mg Intravenous BID   insulin aspart  0-9 Units Subcutaneous TID WC   insulin glargine-yfgn  10 Units Subcutaneous Daily   lidocaine  1 patch Transdermal Q24H   metoprolol succinate  100 mg Oral BID   pantoprazole  40 mg Oral Q1200   sodium chloride flush  3 mL Intravenous Q12H   sodium chloride flush  3 mL Intravenous Q12H   traZODone  50 mg Oral QHS   Continuous Infusions:  sodium chloride     sodium chloride     PRN Meds: sodium chloride, sodium chloride, acetaminophen, fentaNYL (SUBLIMAZE) injection, melatonin, naphazoline-glycerin, ondansetron (ZOFRAN) IV, polyvinyl alcohol, sodium chloride flush, sodium chloride flush   Vital Signs    Vitals:   04/02/22 0815 04/02/22 1119 04/02/22 2056 04/03/22 0426  BP: 107/66 112/72 103/78 118/75  Pulse: 98 99  65  Resp: '19 17 19 18  '$ Temp: 98 F (36.7 C) 97.8 F (36.6 C) 98 F (36.7 C) 98.1 F (36.7 C)  TempSrc: Oral Oral Oral Oral  SpO2: 97% 98% 98%   Weight:    102.8 kg  Height:        Intake/Output Summary (Last 24 hours) at 04/03/2022 0754 Last data filed at 04/03/2022 0435 Gross per 24 hour  Intake --  Output 3057 ml  Net -3057 ml      04/03/2022    4:26 AM 04/02/2022    3:38 AM 04/01/2022    3:15 AM  Last 3 Weights  Weight (lbs) 226 lb 10.1 oz 234 lb 9.1 oz 242 lb 15.2 oz  Weight (kg) 102.8 kg 106.4 kg 110.2 kg      Telemetry    Sinus - Personally Reviewed   Physical Exam   GEN: No acute distress.   Neck: No JVD Cardiac: RRR, no murmurs, rubs, or gallops.  Respiratory:  Clear to auscultation bilaterally. GI: Soft, nontender, non-distended  MS: 1+ edema Neuro:  Nonfocal  Psych: Normal affect   Labs    High Sensitivity Troponin:   Recent Labs  Lab 03/28/22 1816 03/28/22 2029 03/29/22 1125 03/30/22 0457  TROPONINIHS 3,628* 3,084* 3,058* 2,688*     Chemistry Recent Labs  Lab 03/30/22 0457 03/30/22 1520 03/31/22 0532 04/01/22 0239 04/02/22 0143 04/03/22 0206  NA 139   < > 137 137 131* 134*  K 3.3*   < > 4.3 4.1 4.0 3.6  CL 99  --  105 106 100 100  CO2 27  --  '24 22 23 25  '$ GLUCOSE 165*  --  168* 146* 266* 170*  BUN 50*  --  31* 30* 34* 34*  CREATININE 1.44*  --  1.16 1.19 0.99 0.98  CALCIUM 8.2*  --  8.1* 7.7* 7.8* 8.1*  MG 1.6*  --   --   --  1.8  --   PROT  --   --  4.6*  --   --   --   ALBUMIN  --   --  2.2*  --   --   --   AST  --   --  33  --   --   --   ALT  --   --  75*  --   --   --   ALKPHOS  --   --  32*  --   --   --   BILITOT  --   --  1.7*  --   --   --   GFRNONAA 51*  --  >60 >60 >60 >60  ANIONGAP 13  --  '8 9 8 9   '$ < > = values in this interval not displayed.     Hematology Recent Labs  Lab 04/01/22 0239 04/02/22 0143 04/02/22 1005 04/03/22 0206  WBC 6.4 4.6  --  8.3  RBC 3.69* 3.04* 3.32* 3.34*  HGB 11.3* 9.2*  --  10.5*  HCT 33.0* 27.2*  --  29.3*  MCV 89.4 89.5  --  87.7  MCH 30.6 30.3  --  31.4  MCHC 34.2 33.8  --  35.8  RDW 15.7* 15.5  --  15.5  PLT 106* 106*  --  152    BNP Recent Labs  Lab 03/28/22 1816  BNP 531.0*    Patient Profile     77 y.o. male with past medical history of permanent atrial fibrillation, hypertension, obstructive sleep apnea admitted with hypotension/acute kidney injury and ruled in for non-ST elevation myocardial infarction.  Echocardiogram this admission showed ejection fraction 50 to 55%, mild left ventricular hypertrophy, mild RV dysfunction, moderate left atrial enlargement, mild right atrial enlargement, mild to moderate mitral regurgitation.  Cardiac catheterization  showed 75% proximal LAD, 90% first marginal, 60% ostial RCA, 70% proximal RCA.  Patient had PCI of the right coronary artery with 2 drug-eluting stents.  He subsequently had staged PCI of LAD; medical therapy planned for OM.  Patient subsequent placed on Lasix for volume excess.  Assessment & Plan    1 status post PCI of RCA and LAD-plan to continue aspirin and Plavix for 30 days and then discontinue aspirin and continue Plavix long-term.  Continue Lipitor.  Continue beta-blocker.  2 permanent atrial fibrillation-continue Toprol for rate control.  Continue apixaban.  3 acute on chronic diastolic congestive heart failure-remains mildly volume overloaded.  Continue Lasix at present dose today and follow renal function.  For questions or updates, please contact [place] Please consult [email or web address] for contact info under        Signed, Kirk Ruths, MD  04/03/2022, 7:54 AM

## 2022-04-03 NOTE — TOC Benefit Eligibility Note (Signed)
Patient Teacher, English as a foreign language completed.    The patient is currently admitted and upon discharge could be taking Jardiance 10 mg.  The current 30 day co-pay is $45.00.   The patient is insured through [place], [place] Patient Advocate Specialist [place] Patient Advocate Team Direct Number: [phone]  Fax: [phone]

## 2022-04-03 NOTE — Progress Notes (Signed)
Physical Therapy Treatment Patient Details Name: Arthur Wilson MRN: [id number] DOB: September 15, 1945 Today's Date: 04/03/2022   History of Present Illness Pt is a 77 yo male who was sent to ED from PCP due to hypotension. Pt started oral lasix 3 days prior when orthopedic MD noticed bilat weeping edema in LEs. Pt fell in early December on R knee and it is now causing significant pain. Images have been done and no injury noted. Pt also found to have NSTEMI or Type 2 MI and had a R and L cardiac cath. PMH: afib on eloquis, bilat LE edema, CHF    PT Comments    Pt received in recliner. Reports R knee is feeling better but pain is still present. He required min assist sit to stand, and min guard assist amb 25' with RW. Pt encouraged to increase gait distance, but he decline amb in hallway. LE exercises performed upon return to recliner. Pt remained in recliner with feet elevated at end of session. Max HR 122. Pt encouraged to walk multiple times today with staff (mobility tech, cardiac rehab, and/or nursing).    Recommendations for follow up therapy are one component of a multi-disciplinary discharge planning process, led by the attending physician.  Recommendations may be updated based on patient status, additional functional criteria and insurance authorization.  Follow Up Recommendations  Home health PT     Assistance Recommended at Discharge Frequent or constant Supervision/Assistance  Patient can return home with the following A little help with walking and/or transfers;A little help with bathing/dressing/bathroom;Assist for transportation;Help with stairs or ramp for entrance   Equipment Recommendations  Rolling walker (2 wheels)    Recommendations for Other Services       Precautions / Restrictions Precautions Precautions: Fall Restrictions Other Position/Activity Restrictions: pt self limiting with WBing on R LE due to pain     Mobility  Bed Mobility               General  bed mobility comments: Pt up in recliner.    Transfers Overall transfer level: Needs assistance Equipment used: Rolling walker (2 wheels) Transfers: Sit to/from Stand Sit to Stand: Min assist           General transfer comment: cues for sequencing, increased time    Ambulation/Gait Ambulation/Gait assistance: Min guard Gait Distance (Feet): 25 Feet Assistive device: Rolling walker (2 wheels) Gait Pattern/deviations: Step-through pattern, Decreased stride length Gait velocity: decreased Gait velocity interpretation: <1.31 ft/sec, indicative of household ambulator   General Gait Details: distance limited by R knee pain. Pt declining hallway amb attempt.   Stairs             Wheelchair Mobility    Modified Rankin (Stroke Patients Only)       Balance Overall balance assessment: Needs assistance Sitting-balance support: Feet supported, No upper extremity supported Sitting balance-Leahy Scale: Good     Standing balance support: During functional activity, Reliant on assistive device for balance, Bilateral upper extremity supported Standing balance-Leahy Scale: Poor                              Cognition Arousal/Alertness: Awake/alert Behavior During Therapy: WFL for tasks assessed/performed Overall Cognitive Status: Within Functional Limits for tasks assessed                                 General Comments: verbose and  tangential        Exercises General Exercises - Lower Extremity Ankle Circles/Pumps: AROM, Both, 10 reps, Seated Long Arc Quad: AROM, Right, Left, 10 reps, Seated Hip Flexion/Marching: AROM, Right, Left, 10 reps, Seated    General Comments General comments (skin integrity, edema, etc.): VSS on RA. Max HR 122      Pertinent Vitals/Pain Pain Assessment Pain Assessment: 0-10 Pain Score: 8  Pain Location: R knee with mobility Pain Descriptors / Indicators: Discomfort, Guarding Pain Intervention(s): Patient  requesting pain meds-RN notified, Limited activity within patient's tolerance    Home Living                          Prior Function            PT Goals (current goals can now be found in the care plan section) Acute Rehab PT Goals Patient Stated Goal: walk and go home Progress towards PT goals: Progressing toward goals    Frequency    Min 3X/week      PT Plan Current plan remains appropriate    Co-evaluation              AM-PAC PT "6 Clicks" Mobility   Outcome Measure  Help needed turning from your back to your side while in a flat bed without using bedrails?: A Lot Help needed moving from lying on your back to sitting on the side of a flat bed without using bedrails?: A Lot Help needed moving to and from a bed to a chair (including a wheelchair)?: A Little Help needed standing up from a chair using your arms (e.g., wheelchair or bedside chair)?: A Little Help needed to walk in hospital room?: A Little Help needed climbing 3-5 steps with a railing? : A Lot 6 Click Score: 15    End of Session Equipment Utilized During Treatment: Gait belt Activity Tolerance: Patient limited by pain Patient left: in chair;with call bell/phone within reach Nurse Communication: Patient requests pain meds PT Visit Diagnosis: Unsteadiness on feet (R26.81);Muscle weakness (generalized) (M62.81);Difficulty in walking, not elsewhere classified (R26.2)     Time: 0933-1000 PT Time Calculation (min) (ACUTE ONLY): 27 min  Charges:  $Gait Training: 8-22 mins $Therapeutic Exercise: 8-22 mins                     Arthur Wilson, PT  Office # [phone] Pager [phone]    Arthur Wilson 04/03/2022, 11:00 AM

## 2022-04-04 ENCOUNTER — Other Ambulatory Visit (HOSPITAL_COMMUNITY): Payer: Self-pay

## 2022-04-04 DIAGNOSIS — I5031 Acute diastolic (congestive) heart failure: Secondary | ICD-10-CM | POA: Insufficient documentation

## 2022-04-04 DIAGNOSIS — I214 Non-ST elevation (NSTEMI) myocardial infarction: Secondary | ICD-10-CM | POA: Diagnosis not present

## 2022-04-04 LAB — CBC
HCT: 35.2 % — ABNORMAL LOW (ref 39.0–52.0)
Hemoglobin: 12.3 g/dL — ABNORMAL LOW (ref 13.0–17.0)
MCH: 31.1 pg (ref 26.0–34.0)
MCHC: 34.9 g/dL (ref 30.0–36.0)
MCV: 89.1 fL (ref 80.0–100.0)
Platelets: 229 10*3/uL (ref 150–400)
RBC: 3.95 MIL/uL — ABNORMAL LOW (ref 4.22–5.81)
RDW: 15.8 % — ABNORMAL HIGH (ref 11.5–15.5)
WBC: 8.4 10*3/uL (ref 4.0–10.5)
nRBC: 0 % (ref 0.0–0.2)

## 2022-04-04 LAB — BASIC METABOLIC PANEL
Anion gap: 10 (ref 5–15)
BUN: 35 mg/dL — ABNORMAL HIGH (ref 8–23)
CO2: 29 mmol/L (ref 22–32)
Calcium: 8.5 mg/dL — ABNORMAL LOW (ref 8.9–10.3)
Chloride: 98 mmol/L (ref 98–111)
Creatinine, Ser: 1.24 mg/dL (ref 0.61–1.24)
GFR, Estimated: >60 mL/min (ref >60)
Glucose, Bld: 111 mg/dL — ABNORMAL HIGH (ref 70–99)
Potassium: 3.3 mmol/L — ABNORMAL LOW (ref 3.5–5.1)
Sodium: 137 mmol/L (ref 135–145)

## 2022-04-04 LAB — GLUCOSE, CAPILLARY: Glucose-Capillary: 118 mg/dL — ABNORMAL HIGH (ref 70–99)

## 2022-04-04 MED ORDER — CLOPIDOGREL BISULFATE 75 MG PO TABS
75.0000 mg | ORAL_TABLET | Freq: Every day | ORAL | 1 refills | Status: AC
  Filled 2022-04-04: qty 30, 30d supply, fill #0

## 2022-04-04 MED ORDER — POLYETHYLENE GLYCOL 3350 17 GM/SCOOP PO POWD
17.0000 g | Freq: Every day | ORAL | 0 refills | Status: DC | PRN
  Filled 2022-04-04: qty 238, 14d supply, fill #0

## 2022-04-04 MED ORDER — EMPAGLIFLOZIN 10 MG PO TABS
10.0000 mg | ORAL_TABLET | Freq: Every day | ORAL | 0 refills | Status: AC
  Filled 2022-04-04: qty 30, 30d supply, fill #0

## 2022-04-04 MED ORDER — POTASSIUM CHLORIDE CRYS ER 20 MEQ PO TBCR
20.0000 meq | EXTENDED_RELEASE_TABLET | Freq: Every day | ORAL | 0 refills | Status: DC
  Filled 2022-04-04: qty 30, 30d supply, fill #0

## 2022-04-04 MED ORDER — ASPIRIN 81 MG PO CHEW
81.0000 mg | CHEWABLE_TABLET | Freq: Every day | ORAL | 0 refills | Status: DC
  Filled 2022-04-04: qty 30, 30d supply, fill #0

## 2022-04-04 MED ORDER — PANTOPRAZOLE SODIUM 40 MG PO TBEC
40.0000 mg | DELAYED_RELEASE_TABLET | Freq: Every day | ORAL | 0 refills | Status: AC
  Filled 2022-04-04: qty 30, 30d supply, fill #0

## 2022-04-04 MED ORDER — METOPROLOL SUCCINATE ER 100 MG PO TB24
100.0000 mg | ORAL_TABLET | Freq: Two times a day (BID) | ORAL | 0 refills | Status: DC
  Filled 2022-04-04: qty 30, 15d supply, fill #0

## 2022-04-04 MED ORDER — POTASSIUM CHLORIDE CRYS ER 20 MEQ PO TBCR
40.0000 meq | EXTENDED_RELEASE_TABLET | Freq: Once | ORAL | Status: AC
  Administered 2022-04-04: 40 meq via ORAL
  Filled 2022-04-04: qty 2

## 2022-04-04 MED ORDER — ATORVASTATIN CALCIUM 40 MG PO TABS
40.0000 mg | ORAL_TABLET | Freq: Every day | ORAL | 0 refills | Status: AC
  Filled 2022-04-04: qty 30, 30d supply, fill #0

## 2022-04-04 MED ORDER — FUROSEMIDE 40 MG PO TABS
40.0000 mg | ORAL_TABLET | Freq: Every day | ORAL | Status: DC
  Administered 2022-04-04: 40 mg via ORAL
  Filled 2022-04-04: qty 1

## 2022-04-04 MED ORDER — DICLOFENAC SODIUM 1 % EX GEL
2.0000 g | Freq: Four times a day (QID) | CUTANEOUS | 0 refills | Status: AC
  Filled 2022-04-04: qty 100, fill #0

## 2022-04-04 MED ORDER — METOPROLOL SUCCINATE ER 100 MG PO TB24
100.0000 mg | ORAL_TABLET | Freq: Two times a day (BID) | ORAL | 0 refills | Status: DC
  Filled 2022-04-04: qty 60, 30d supply, fill #0

## 2022-04-04 NOTE — Discharge Summary (Signed)
Physician Discharge Summary  Arthur Wilson ION:[id number] DOB: September 15, 1945 DOA: 03/28/2022  PCP: [name], MD  Admit date: 03/28/2022 Discharge date: 04/04/2022  Time spent: 45 minutes  Recommendations for Outpatient Follow-up:  Check CBC in 2 weeks, on triple therapy, continue aspirin Plavix and Eliquis for 4 weeks, then Plavix and Eliquis for 6 months, followed by Eliquis monotherapy CHF [facility] clinic Cardiology Dr. Domenic Polite in 1 to 2 months   Discharge Diagnoses:  Principal Problem:   NSTEMI (non-ST elevated myocardial infarction) ([place]) Active Problems:   Elevated brain natriuretic peptide (BNP) level   Lactic acidosis   AKI (acute kidney injury) ([place])   Type 2 diabetes mellitus with hyperglycemia (HCC)   Hypotension due to hypovolemia   Atrial fibrillation with rapid ventricular response (HCC)   Acute diastolic CHF (congestive heart failure) ([place])   Discharge Condition: Improved  Diet recommendation: Low-sodium, diabetic  Filed Weights   04/02/22 0338 04/03/22 0426 04/04/22 0509  Weight: 106.4 kg 102.8 kg 103.1 kg    History of present illness:  76/M with history of paroxysmal A-fib on Eliquis, type 2 diabetes mellitus, chronic leg edema presented to the ED with low blood pressure at his PCPs office. -Patient reports falling and injuring his right knee in early December, history of ongoing pain for several weeks, saw Dr. Reynaldo Minium last week, imaging was unremarkable, noted to have swelling and asked to see his primary care doctor then.  Went to his PCPs office on 1/2, was noted to be hypotensive and sent to the emergency room.  BP in the 80s, labs in the ED noted white count of 11.5, creatinine 2.7, troponin 3628, lactate 3.1, BNP 531 chest x-ray noted cardiomegaly, otherwise unremarkable  Hospital Course:   NSTEMI  -Treated with IV heparin, troponin peaked at 3600 -Creatinine improved, underwent stenting of RCA with 2 overlapping stents 1/4 -1/5 underwent  PCI of  proximal LAD, plan to manage left circumflex disease medically  -Echo with a EF of 50-55%, new wall motion abnormalities, mildly reduced RV -Continue aspirin/Plavix/metoprolol, statin -Continue triple therapy for 4 weeks followed by Plavix and Eliquis for 6 months followed by Eliquis monotherapy -Increase activity   Acute on chronic diastolic CHF Hypoalbuminemia, third spacing -2D echo with EF 50-55%, mildly reduced RV function -Right heart cath with preserved cardiac output, wedge elevated at 18 -Diuresed with IV Lasix, he is 8.1 L negative, continue IV Lasix today  -Blood pressure more stable, add Jardiance -GDMT limited by soft BPs -Dopplers negative for DVT -Increase protein supplements, albumin is low, no history of liver disease -Increase activity   Paroxysmal atrial fibrillation -Could be contributing to his CHF as well -Bisoprolol on hold, started on metoprolol -Eliquis resumed   Right knee pain/contusion -Recent fall, saw orthopedics last week, CT right knee 12/27, No evidence of acute fracture or dislocation.,  Mild tricompartmental degenerative changes noted and small knee joint effusion -Supportive care,  Voltaren gel -Taper off Decadron, unable to use NSAIDs in the setting of triple therapy above -Physical therapy   Normocytic anemia -Hemoglobin slightly lower, patient denies overt bleeding, anemia panel with adequate iron stores   AKI (presumed) -Likely ATN in the setting of hypotension, now resolved   Lactic Acidosis -resolved after hydration given in ED   Type 2 DM -Now on Jardiance, CBGs are stable   Thrombocytopenia -Improving,   Consultants:  Cardiology    Procedure LHC 1/4 high-grade disease of proximal LAD and obtuse marginal with RFR positive ostial and proximal to mid right  coronary artery disease, PCI of the right coronary artery was pursued with 2 overlapping drug-eluting stents placed.   1/5: PCI to LAD  Discharge Exam: Vitals:   04/04/22  0818 04/04/22 0940  BP: 120/75 122/73  Pulse:  79  Resp:  19  Temp:  98 F (36.7 C)  SpO2:  97%   General exam: Obese pleasant male sitting up in bed, AAOx3, no distress HEENT: Positive JVD CVS: S1-S2, irregularly irregular rhythm Lungs: Decreased breath sounds at the bases Abdomen: Soft, nontender, bowel sounds present Extremities: 1+ edema,  mild right knee tenderness with full range of motion  Skin: No rashes, lesions or ulcers. Psychiatry: Anxious  Discharge Instructions   Discharge Instructions     Amb Referral to Cardiac Rehabilitation   Complete by: As directed    Diagnosis:  Coronary Stents NSTEMI PTCA     After initial evaluation and assessments completed: Virtual Based Care may be provided alone or in conjunction with Phase 2 Cardiac Rehab based on patient barriers.: Yes   Intensive Cardiac Rehabilitation (ICR) [place] location only OR Traditional Cardiac Rehabilitation (TCR) *If criteria for ICR are not met will enroll in TCR [facility] only): Yes   Diet - low sodium heart healthy   Complete by: As directed    Diet Carb Modified   Complete by: As directed    Increase activity slowly   Complete by: As directed       Allergies as of 04/04/2022   No Known Allergies      Medication List     STOP taking these medications    bisoprolol-hydrochlorothiazide 5-6.25 MG tablet Commonly known as: ZIAC   olmesartan 20 MG tablet Commonly known as: BENICAR   pregabalin 100 MG capsule Commonly known as: LYRICA   rosuvastatin 10 MG tablet Commonly known as: CRESTOR       TAKE these medications    acetaminophen 500 MG tablet Commonly known as: TYLENOL Take 1,000 mg by mouth every 6 (six) hours as needed for moderate pain.   ALPRAZolam 0.5 MG tablet Commonly known as: XANAX Take 0.5 tablets by mouth at bedtime as needed for anxiety or sleep.   aspirin 81 MG chewable tablet Chew 1 tablet (81 mg total) by mouth daily. Start taking on: April 05, 2022    atorvastatin 40 MG tablet Commonly known as: LIPITOR Take 1 tablet (40 mg total) by mouth daily.   clopidogrel 75 MG tablet Commonly known as: PLAVIX Take 1 tablet (75 mg total) by mouth daily with breakfast. Start taking on: April 05, 2022   diclofenac Sodium 1 % Gel Commonly known as: VOLTAREN Apply 2 g topically 4 (four) times daily for 6 days.   Eliquis 5 MG Tabs tablet Generic drug: apixaban Take 5 mg by mouth 2 (two) times daily.   empagliflozin 10 MG Tabs tablet Commonly known as: JARDIANCE Take 1 tablet (10 mg total) by mouth daily. Start taking on: April 05, 2022   fexofenadine 180 MG tablet Commonly known as: ALLEGRA Take 180 mg by mouth daily.   fluticasone 50 MCG/ACT nasal spray Commonly known as: FLONASE Place 1 spray into both nostrils as needed for allergies.   furosemide 40 MG tablet Commonly known as: LASIX Take 40 mg by mouth daily.   HYDROcodone-acetaminophen 7.5-325 MG tablet Commonly known as: NORCO Take 0.5-1 tablets by mouth 2 (two) times daily as needed for moderate pain.   metFORMIN 500 MG tablet Commonly known as: GLUCOPHAGE Take 500 mg by mouth See admin instructions.  Take 1 daily for 14 days, then take 1 tablet twice daily. Start 03/14/22   metoprolol succinate 100 MG 24 hr tablet Commonly known as: TOPROL-XL Take 1 tablet (100 mg total) by mouth 2 (two) times daily. Take with or immediately following a meal.   pantoprazole 40 MG tablet Commonly known as: PROTONIX Take 1 tablet (40 mg total) by mouth daily at 12 noon.   polyethylene glycol 17 g packet Commonly known as: MIRALAX / GLYCOLAX Take 17 g by mouth daily as needed for mild constipation.   potassium chloride SA 20 MEQ tablet Commonly known as: KLOR-CON M Take 1 tablet (20 mEq total) by mouth daily.       No Known Allergies  Follow-up Information     [facility]. Go on 04/12/2022.   Specialty: Cardiology Why: Hospital  follow up 04/12/22 @ 3 pm PLEASE bring a current medication list to appointment FREE valet parking, Entrance C, off [address] on follow up [employer] information: [address] [place]        Satira Sark, MD Follow up on 06/08/2022.   Specialty: Cardiology Why: 1:40PM. Cardiology visit. Contact information: [place] [address] [phone]                  The results of significant diagnostics from this hospitalization (including imaging, microbiology, ancillary and laboratory) are listed below for reference.    Significant Diagnostic Studies: CARDIAC CATHETERIZATION  Result Date: 04/03/2022   Prox LAD lesion is 75% stenosed.   1st Mrg lesion is 90% stenosed.   Non-stenotic Ost RCA to Prox RCA lesion was previously treated.   Non-stenotic Prox RCA lesion was previously treated.   A stent was successfully placed.   Post intervention, there is a 0% residual stenosis. 1.  Successful PCI of proximal LAD aided by orbital atherectomy with OCT optimization. 2.  Residual high-grade and highly calcified lesion of the obtuse marginal; given the relatively small territory subtended by this vessel and that the patient's symptoms are dyspnea rather than angina, after discussion with Dr. Marlou Porch medical therapy will be pursued for this burden of disease. 3.  LVEDP of 10 mmHg but obscured by widely fluctuating RR intervals due to atrial fibrillation. Recommendation: Triple therapy for ideally 4 weeks then Plavix and Eliquis for 6 months in total then Eliquis monotherapy thereafter.   CARDIAC CATHETERIZATION  Result Date: 03/30/2022   Prox LAD lesion is 75% stenosed.   1st Mrg lesion is 90% stenosed.   Ost RCA to Prox RCA lesion is 60% stenosed.   Prox RCA lesion is 70% stenosed.   A stent was successfully placed.   Post intervention, there is a 0% residual stenosis.   Post intervention, there is a  0% residual stenosis. 1.  High-grade disease of proximal LAD and obtuse marginal with RFR positive ostial and proximal to mid right coronary artery disease. 2.  After review with Dr. Marlou Porch, PCI of the right coronary artery was pursued with 2 overlapping drug-eluting stents placed. 3.  Due to dye expenditure, PCI of the proximal LAD and left circumflex will be staged depending on renal function. 4.  LVEDP of 9 mmHg. 5.  Fick cardiac output of 5.5 L/min and Fick cardiac index of 2.4 L/min/m with a mean RA pressure of 11 mmHg, mean wedge pressure of 18 mmHg, and mean PA pressure of 30 mmHg. 6.  Very difficult  cannulation of right coronary artery; please see procedural details. Recommendation: Given history of atrial fibrillation the patient will be maintained on triple therapy for 1 month and then Plavix and Eliquis for 6 months in total and then Eliquis indefinitely.  Depending on the patient's creatinine staged PCI of the LAD and left circumflex will be pursued.   US Venous Img Lower Bilateral (DVT)  Result Date: 03/29/2022 CLINICAL DATA:  Bilateral leg edema EXAM: BILATERAL LOWER EXTREMITY VENOUS DOPPLER ULTRASOUND TECHNIQUE: Gray-scale sonography with compression, as well as color and duplex ultrasound, were performed to evaluate the deep venous system(s) from the level of the common femoral vein through the popliteal and proximal calf veins. COMPARISON:  None Available. FINDINGS: VENOUS Normal compressibility of the common femoral, superficial femoral, and popliteal veins, as well as the visualized calf veins. Visualized portions of profunda femoral vein and great saphenous vein unremarkable. No filling defects to suggest DVT on grayscale or color Doppler imaging. Doppler waveforms show normal direction of venous flow, normal respiratory plasticity and response to augmentation. OTHER None. Limitations: none IMPRESSION: Negative for DVT in the bilateral lower extremities Electronically Signed   By: Merilyn Baba M.D.   On: 03/29/2022 12:07   ECHOCARDIOGRAM COMPLETE  Result Date: 03/29/2022    ECHOCARDIOGRAM REPORT   Patient Name:   Arthur Wilson Date of Exam: 03/29/2022 Medical Rec #:  [id number]       Height:       70.0 in Accession #:    [id number]      Weight:       246.4 lb Date of Birth:  [birth year]      BSA:          2.281 m Patient Age:    24 years        BP:           15/77 mmHg Patient Gender: M               HR:           85 bpm. Exam Location:  [facility] Procedure: 2D Echo, Cardiac Doppler and Color Doppler Indications:    CHF-Acute Diastolic K16.01  History:        Patient has prior history of Echocardiogram examinations, most                 recent 11/05/2019. Previous Myocardial Infarction and CAD,                 Arrythmias:Atrial Fibrillation; Risk Factors:Hypertension and                 Diabetes. OSA (obstructive sleep apnea) (From Hx).  Sonographer:    Alvino Chapel RCS Referring Phys: [id number] OLADAPO ADEFESO IMPRESSIONS  1. Left ventricular ejection fraction, by estimation, is 50 to 55%. The left ventricle has low normal function. The left ventricle demonstrates regional wall motion abnormalities - cannot exclude mild inferior mid to basal hypokinesis. There is mild left ventricular hypertrophy. Left ventricular diastolic parameters are indeterminate.  2. Right ventricular systolic function is mildly reduced. The right ventricular size is normal. There is normal pulmonary artery systolic pressure. The estimated right ventricular systolic pressure is 73.2 mmHg.  3. Left atrial size was moderately dilated.  4. Right atrial size was mildly dilated.  5. The mitral valve is grossly normal. Mild to moderate mitral valve regurgitation.  6. The aortic valve is tricuspid. There is mild calcification of the aortic valve. Aortic valve regurgitation is not visualized.  7. The inferior vena cava is normal in size with greater than 50% respiratory variability, suggesting right atrial pressure of 3 mmHg.  Comparison(s): Prior images reviewed side by side. LVEF low normal at 50-55% with possible mild, mid to basal inferior hypokinesis. Mild RV dysfunction with upper normal estimated RVSP. FINDINGS  Left Ventricle: Left ventricular ejection fraction, by estimation, is 50 to 55%. The left ventricle has low normal function. The left ventricle demonstrates regional wall motion abnormalities. The left ventricular internal cavity size was normal in size. There is mild left ventricular hypertrophy. Left ventricular diastolic function could not be evaluated due to atrial fibrillation. Left ventricular diastolic parameters are indeterminate. Right Ventricle: The right ventricular size is normal. No increase in right ventricular wall thickness. Right ventricular systolic function is mildly reduced. There is normal pulmonary artery systolic pressure. The tricuspid regurgitant velocity is 2.76 m/s, and with an assumed right atrial pressure of 3 mmHg, the estimated right ventricular systolic pressure is 35.5 mmHg. Left Atrium: Left atrial size was moderately dilated. Right Atrium: Right atrial size was mildly dilated. Pericardium: Trivial pericardial effusion is present. The pericardial effusion is posterior to the left ventricle. Mitral Valve: The mitral valve is grossly normal. Mild to moderate mitral valve regurgitation. Tricuspid Valve: The tricuspid valve is grossly normal. Tricuspid valve regurgitation is mild. Aortic Valve: The aortic valve is tricuspid. There is mild calcification of the aortic valve. Aortic valve regurgitation is not visualized. Pulmonic Valve: The pulmonic valve was grossly normal. Pulmonic valve regurgitation is trivial. Aorta: The aortic root is normal in size and structure. Venous: The inferior vena cava is normal in size with greater than 50% respiratory variability, suggesting right atrial pressure of 3 mmHg. IAS/Shunts: No atrial level shunt detected by color flow Doppler.  LEFT VENTRICLE PLAX 2D  LVIDd:         4.70 cm LVIDs:         2.90 cm LV PW:         1.10 cm LV IVS:        1.30 cm LVOT diam:     2.10 cm LV SV:         59 LV SV Index:   26 LVOT Area:     3.46 cm  RIGHT VENTRICLE TAPSE (M-mode): 1.5 cm LEFT ATRIUM              Index        RIGHT ATRIUM           Index LA diam:        4.20 cm  1.84 cm/m   RA Area:     25.00 cm LA Vol (A2C):   128.0 ml 56.12 ml/m  RA Volume:   76.10 ml  33.36 ml/m LA Vol (A4C):   104.0 ml 45.60 ml/m LA Biplane Vol: 118.0 ml 51.73 ml/m  AORTIC VALVE LVOT Vmax:   75.70 cm/s LVOT Vmean:  55.000 cm/s LVOT VTI:    0.169 m  AORTA Ao Root diam: 3.70 cm MITRAL VALVE                TRICUSPID VALVE MV Area (PHT): 4.39 cm     TR Peak grad:   30.5 mmHg MV Decel Time: 173 msec     TR Vmax:        276.00 cm/s MV E velocity: 115.00 cm/s  SHUNTS                             Systemic VTI:  0.17 m                             Systemic Diam: 2.10 cm Rozann Lesches MD Electronically signed by Rozann Lesches MD Signature Date/Time: 03/29/2022/11:42:43 AM    Final    DG Chest 2 View  Result Date: 03/28/2022 CLINICAL DATA:  Sepsis suspected EXAM: CHEST - 2 VIEW COMPARISON:  01/14/2020 FINDINGS: Mild cardiomegaly. No focal pulmonary opacity. No pleural effusion or pneumothorax. No acute osseous abnormality. IMPRESSION: Cardiomegaly with no additional acute cardiopulmonary process. Electronically Signed   By: Merilyn Baba M.D.   On: 03/28/2022 18:51   CT KNEE RIGHT WO CONTRAST  Result Date: 03/22/2022 CLINICAL DATA:  Severe right knee pain and swelling with limited weight-bearing after falling 3 weeks ago. EXAM: CT OF THE RIGHT KNEE WITHOUT CONTRAST TECHNIQUE: Multidetector CT imaging of the right knee was performed according to the standard protocol. Multiplanar CT image reconstructions were also generated. RADIATION DOSE REDUCTION: This exam was performed according to the departmental dose-optimization program which includes automated exposure control,  adjustment of the mA and/or kV according to patient size and/or use of iterative reconstruction technique. COMPARISON:  Radiographs 03/17/2022. FINDINGS: Bones/Joint/Cartilage The bones appear adequately mineralized. No evidence of acute fracture or dislocation. There are mild tricompartmental degenerative changes which appear most advanced in the medial compartment. There is a small knee joint effusion without evidence of lipohemarthrosis or intra-articular loose body. Ligaments Suboptimally assessed by CT. Muscles and Tendons The extensor mechanism is intact. No focal muscular abnormalities are identified about the knee. Soft tissues Iliofemoral atherosclerosis with mild nonspecific deep vein dilatation within the proximal lower leg. There is subcutaneous edema about the knee without evidence of foreign body or soft tissue emphysema. IMPRESSION: 1. No evidence of acute fracture or dislocation. 2. Mild tricompartmental degenerative changes, most advanced in the medial compartment. 3. Small knee joint effusion without evidence of lipohemarthrosis or intra-articular loose body. 4. Nonspecific subcutaneous edema about the knee without evidence of foreign body or soft tissue emphysema. 5. Mild nonspecific deep vein dilatation in the proximal lower leg. Consider correlation with lower extremity venous Doppler ultrasound. Electronically Signed   By: Richardean Sale M.D.   On: 03/22/2022 12:43    Microbiology: Recent Results (from the past 240 hour(s))  Culture, blood (Routine x 2)     Status: None   Collection Time: 03/28/22  6:16 PM   Specimen: BLOOD  Result Value Ref Range Status   Specimen Description BLOOD LEFT ANTECUBITAL  Final   Special Requests   Final    BOTTLES DRAWN AEROBIC AND ANAEROBIC Blood Culture results may not be optimal due to an excessive volume of blood received in culture bottles   Culture   Final    NO GROWTH 5 DAYS Performed at [facility], [address], [place], [place] [address]     Report Status 04/02/2022 FINAL  Final  Culture, blood (Routine x 2)     Status: None   Collection Time: 03/28/22  6:16 PM   Specimen: BLOOD  Result Value Ref Range Status   Specimen Description BLOOD BLOOD RIGHT ARM  Final   Special Requests   Final    BOTTLES DRAWN AEROBIC AND ANAEROBIC Blood Culture results may not be optimal due  to an excessive volume of blood received in culture bottles   Culture   Final    NO GROWTH 5 DAYS Performed at [facility], [address] [place], [place] [address]    Report Status 04/02/2022 FINAL  Final     Labs: Basic Metabolic Panel: Recent Labs  Lab 03/30/22 0457 03/30/22 1520 03/31/22 0532 04/01/22 0239 04/02/22 0143 04/03/22 0206 04/04/22 0629  NA 139   < > 137 137 131* 134* 137  K 3.3*   < > 4.3 4.1 4.0 3.6 3.3*  CL 99  --  105 106 100 100 98  CO2 27  --  '24 22 23 25 29  '$ GLUCOSE 165*  --  168* 146* 266* 170* 111*  BUN 50*  --  31* 30* 34* 34* 35*  CREATININE 1.44*  --  1.16 1.19 0.99 0.98 1.24  CALCIUM 8.2*  --  8.1* 7.7* 7.8* 8.1* 8.5*  MG 1.6*  --   --   --  1.8  --   --    < > = values in this interval not displayed.   Liver Function Tests: Recent Labs  Lab 03/31/22 0532  AST 33  ALT 75*  ALKPHOS 32*  BILITOT 1.7*  PROT 4.6*  ALBUMIN 2.2*   No results for input(s): "LIPASE", "AMYLASE" in the last 168 hours. No results for input(s): "AMMONIA" in the last 168 hours. CBC: Recent Labs  Lab 03/31/22 0532 04/01/22 0239 04/02/22 0143 04/03/22 0206 04/04/22 0629  WBC 6.3 6.4 4.6 8.3 8.4  HGB 12.3* 11.3* 9.2* 10.5* 12.3*  HCT 35.4* 33.0* 27.2* 29.3* 35.2*  MCV 89.8 89.4 89.5 87.7 89.1  PLT 100* 106* 106* 152 229   Cardiac Enzymes: No results for input(s): "CKTOTAL", "CKMB", "CKMBINDEX", "TROPONINI" in the last 168 hours. BNP: BNP (last 3 results) Recent Labs    03/28/22 1816  BNP 531.0*    ProBNP (last 3 results) No results for input(s): "PROBNP" in the last 8760 hours.  CBG: Recent Labs  Lab  04/02/22 2116 04/03/22 0752 04/03/22 1215 04/03/22 1552 04/04/22 0802  GLUCAP 242* 165* 239* 162* 118*       Signed:  Domenic Polite MD.  [facility] 04/04/2022, 10:15 AM

## 2022-04-04 NOTE — Progress Notes (Signed)
CARDIAC REHAB PHASE I     Pt feeling well this morning. Setting up for wash up before discharge home. Reviewed exertion scale with pt for home. Pt right knee pain will be challenging for mobility. Encouraged pt to go at slow pace at home with activity. Referral sent to [facility] for CRP2.   [id number]  Vanessa Barbara, RN BSN 04/04/2022 10:46 AM

## 2022-04-04 NOTE — Progress Notes (Signed)
Mobility Specialist - Progress Note   04/04/22 1049  Mobility  Activity Ambulated with assistance in room  Level of Assistance Minimal assist, patient does 75% or more  Assistive Device Front wheel walker  Distance Ambulated (ft) 5 ft  Activity Response Tolerated well  Mobility Referral Yes  $Mobility charge 1 Mobility   Pt received in chair at sink and needing assistance back to bed. Pt was MinA throughout. Pt was left in bed with all needs met.   Franki Monte  Mobility Specialist Please contact via Solicitor or Rehab office at [phone]

## 2022-04-04 NOTE — Progress Notes (Signed)
Rounding Note    Patient Name: Arthur Wilson Date of Encounter: 04/04/2022  [place] Cardiologist: Rozann Lesches, MD   Subjective   Pt denies CP or dyspnea  Inpatient Medications    Scheduled Meds:  apixaban  5 mg Oral BID   aspirin  81 mg Oral Daily   atorvastatin  40 mg Oral Daily   bisacodyl  10 mg Rectal Once   clopidogrel  75 mg Oral Q breakfast   diclofenac Sodium  2 g Topical QID   empagliflozin  10 mg Oral Daily   feeding supplement  1 Container Oral TID BM   furosemide  40 mg Intravenous BID   insulin aspart  0-9 Units Subcutaneous TID WC   insulin glargine-yfgn  10 Units Subcutaneous Daily   lidocaine  1 patch Transdermal Q24H   metoprolol succinate  100 mg Oral BID   pantoprazole  40 mg Oral Q1200   senna-docusate  1 tablet Oral BID   sodium chloride flush  3 mL Intravenous Q12H   sodium chloride flush  3 mL Intravenous Q12H   traZODone  50 mg Oral QHS   Continuous Infusions:  sodium chloride     sodium chloride     PRN Meds: sodium chloride, sodium chloride, acetaminophen, fentaNYL (SUBLIMAZE) injection, melatonin, naphazoline-glycerin, ondansetron (ZOFRAN) IV, polyethylene glycol, polyvinyl alcohol, sodium chloride flush, sodium chloride flush   Vital Signs    Vitals:   04/03/22 0753 04/03/22 1010 04/03/22 2055 04/04/22 0509  BP: 114/73  105/66 125/70  Pulse: 99 78  75  Resp: '18  16 17  '$ Temp: 98 F (36.7 C)  98.6 F (37 C) 98.4 F (36.9 C)  TempSrc: Oral  Oral Oral  SpO2: 98%  99% 96%  Weight:    103.1 kg  Height:        Intake/Output Summary (Last 24 hours) at 04/04/2022 0814 Last data filed at 04/04/2022 0500 Gross per 24 hour  Intake --  Output 2750 ml  Net -2750 ml       04/04/2022    5:09 AM 04/03/2022    4:26 AM 04/02/2022    3:38 AM  Last 3 Weights  Weight (lbs) 227 lb 4.7 oz 226 lb 10.1 oz 234 lb 9.1 oz  Weight (kg) 103.1 kg 102.8 kg 106.4 kg      Telemetry    Sinus - Personally Reviewed   Physical Exam    GEN: NAD Neck: Supple Cardiac: RRR Respiratory: CTA GI: Soft, NT/ND MS: trace edema Neuro:  Grossly intact Psych: Normal affect   Labs    High Sensitivity Troponin:   Recent Labs  Lab 03/28/22 1816 03/28/22 2029 03/29/22 1125 03/30/22 0457  TROPONINIHS 3,628* 3,084* 3,058* 2,688*      Chemistry Recent Labs  Lab 03/30/22 0457 03/30/22 1520 03/31/22 0532 04/01/22 0239 04/02/22 0143 04/03/22 0206 04/04/22 0629  NA 139   < > 137   < > 131* 134* 137  K 3.3*   < > 4.3   < > 4.0 3.6 3.3*  CL 99  --  105   < > 100 100 98  CO2 27  --  24   < > '23 25 29  '$ GLUCOSE 165*  --  168*   < > 266* 170* 111*  BUN 50*  --  31*   < > 34* 34* 35*  CREATININE 1.44*  --  1.16   < > 0.99 0.98 1.24  CALCIUM 8.2*  --  8.1*   < >  7.8* 8.1* 8.5*  MG 1.6*  --   --   --  1.8  --   --   PROT  --   --  4.6*  --   --   --   --   ALBUMIN  --   --  2.2*  --   --   --   --   AST  --   --  33  --   --   --   --   ALT  --   --  75*  --   --   --   --   ALKPHOS  --   --  32*  --   --   --   --   BILITOT  --   --  1.7*  --   --   --   --   GFRNONAA 51*  --  >60   < > >60 >60 >60  ANIONGAP 13  --  8   < > '8 9 10   '$ < > = values in this interval not displayed.      Hematology Recent Labs  Lab 04/02/22 0143 04/02/22 1005 04/03/22 0206 04/04/22 0629  WBC 4.6  --  8.3 8.4  RBC 3.04* 3.32* 3.34* 3.95*  HGB 9.2*  --  10.5* 12.3*  HCT 27.2*  --  29.3* 35.2*  MCV 89.5  --  87.7 89.1  MCH 30.3  --  31.4 31.1  MCHC 33.8  --  35.8 34.9  RDW 15.5  --  15.5 15.8*  PLT 106*  --  152 229     BNP Recent Labs  Lab 03/28/22 1816  BNP 531.0*     Patient Profile     77 y.o. male with past medical history of permanent atrial fibrillation, hypertension, obstructive sleep apnea admitted with hypotension/acute kidney injury and ruled in for non-ST elevation myocardial infarction.  Echocardiogram this admission showed ejection fraction 50 to 55%, mild left ventricular hypertrophy, mild RV dysfunction,  moderate left atrial enlargement, mild right atrial enlargement, mild to moderate mitral regurgitation.  Cardiac catheterization showed 75% proximal LAD, 90% first marginal, 60% ostial RCA, 70% proximal RCA.  Patient had PCI of the right coronary artery with 2 drug-eluting stents.  He subsequently had staged PCI of LAD; medical therapy planned for OM.  Patient subsequent placed on Lasix for volume excess.  Assessment & Plan    1 status post PCI of RCA and LAD-plan to continue aspirin and Plavix for 30 days and then discontinue aspirin and continue Plavix long-term.  Continue Lipitor.  Continue beta-blocker.  2 permanent atrial fibrillation-continue Toprol for rate control.  Continue apixaban.  3 acute on chronic diastolic congestive heart failure-much improved.  Change Lasix to 40 mg daily by mouth.  Continue Jardiance.  4 hypokalemia-supplement  Patient can be discharged from a cardiac standpoint.  Check potassium and renal function 1 week after discharge.  Will arrange follow-up with APP in [place] in 1 to 2 weeks.  For questions or updates, please contact [place] Please consult [email or web address] for contact info under        Signed, Kirk Ruths, MD  04/04/2022, 8:14 AM

## 2022-04-04 NOTE — Progress Notes (Signed)
   Heart Failure Stewardship Pharmacist Progress Note   PCP: [name], MD PCP-Cardiologist: Rozann Lesches, MD    HPI:  77 yo M with PMH of afib, HTN, and OSA.  Presented to the ED on 1/2 with hypotension, LE edema, and generalized weakness and confusion. Recently went to orthopedic for knee pain and was started on furosemide for edema. BNP, troponin, and lactic acid elevated on arrival. Found to be in afib RVR. CXR showed cardiomegaly without acute cardiopulmonary process. ECHO 1/3 showed LVEF 50-55%, regional wall motion abnormalities, mild LVH, RV mildly reduced. Taken for [facility] on 1/4 and found to have high grade stenosis of RCA s/p 2 overlapping DES. Was also found to have disease in LAD, s/p staged PCI with DES and atherectomy on 1/5. RA 11, PA 30, wedge 18, CO 5.6, CI 2.5.  Discharge HF Medications: Diuretic: furosemide 40 mg daily Beta Blocker: metoprolol XL 100 mg BID SGLT2i: Jardiance 10 mg daily  Prior to admission HF Medications: Diuretic: furosemide 40 mg daily Beta blocker: bisoprolol 5 mg daily ACE/ARB/ARNI: olmesartan 20 mg daily  Pertinent Lab Values: Serum creatinine 1.24, BUN 35, Potassium 3.3, Sodium 137, BNP 531.0, Magnesium 1.8, A1c 7.4   Vital Signs: Weight: 227 lbs (admission weight: 246 lbs) Blood pressure: 120/70s  Heart rate: 80s  I/O: -2.9L yesterday; net -10.9L  Medication Assistance / Insurance Benefits Check: Does the patient have prescription insurance?  Yes Type of insurance plan: [place] [employer]  Outpatient Pharmacy:  Prior to admission outpatient pharmacy: [place] Is the patient willing to use [facility] pharmacy at discharge? Yes Is the patient willing to transition their outpatient pharmacy to utilize a [facility] outpatient pharmacy?   No    Assessment: 1. Acute on chronic diastolic CHF (LVEF 33-00%), due to ICM. NYHA class III symptoms. - Continue furosemide 40 mg daily at discharge. Strict I/Os and daily weights. Keep  K>4 and Mag>2 - Continue metoprolol XL 100 mg BID. Was on bisoprolol PTA.  - Consider adding MRA prior at follow up pending renal function and BP. - Continue Jardiance 10 mg daily   Plan: 1) Medication changes recommended at this time: - Discharge today   2) Patient assistance: - Farxiga/Jardiance copay $45  3)  Education  - Patient has been educated on current HF medications and potential additions to HF medication regimen - Patient verbalizes understanding that over the next few months, these medication doses may change and more medications may be added to optimize HF regimen - Patient has been educated on basic disease state pathophysiology and goals of therapy   Kerby Nora, PharmD, BCPS Heart Failure Stewardship Pharmacist Phone [phone]

## 2022-04-04 NOTE — TOC Transition Note (Signed)
Transition of Care [facility]) - CM/SW Discharge Note   Patient Details  Name: Arthur Wilson MRN: [id number] Date of Birth: [birth year]  Transition of Care [facility]) CM/SW Contact:  Levonne Lapping, RN Phone Number: 04/04/2022, 12:32 PM   Clinical Narrative:     CM met with Patient and Wife in room to discuss dc plans.  Patient will return home with Wife (wife to transport). Patient being recommended HH . [place] has accepted. Patient states he owns walker and wheelchair and has a lot of equipment stored so he will not need the recommended rolling walker . No additional TOC needs     Barriers to Discharge: Continued Medical Work up   Patient Goals and CMS Choice      Discharge Placement  Home                          Discharge Plan and Services Additional resources added to the After Visit Summary for                                       Social Determinants of Health (SDOH) Interventions SDOH Screenings   Food Insecurity: No Food Insecurity (03/29/2022)  Housing: Low Risk  (03/29/2022)  Transportation Needs: No Transportation Needs (03/29/2022)  Utilities: Not At Risk (03/29/2022)  Alcohol Screen: Low Risk  (03/30/2022)  Financial Resource Strain: Low Risk  (03/30/2022)  Tobacco Use: Low Risk  (04/03/2022)     Readmission Risk Interventions     No data to display

## 2022-04-07 DIAGNOSIS — I110 Hypertensive heart disease with heart failure: Secondary | ICD-10-CM | POA: Diagnosis not present

## 2022-04-07 DIAGNOSIS — E1165 Type 2 diabetes mellitus with hyperglycemia: Secondary | ICD-10-CM | POA: Diagnosis not present

## 2022-04-07 DIAGNOSIS — M47812 Spondylosis without myelopathy or radiculopathy, cervical region: Secondary | ICD-10-CM | POA: Diagnosis not present

## 2022-04-07 DIAGNOSIS — Z7984 Long term (current) use of oral hypoglycemic drugs: Secondary | ICD-10-CM | POA: Diagnosis not present

## 2022-04-07 DIAGNOSIS — Z7982 Long term (current) use of aspirin: Secondary | ICD-10-CM | POA: Diagnosis not present

## 2022-04-07 DIAGNOSIS — L89893 Pressure ulcer of other site, stage 3: Secondary | ICD-10-CM | POA: Diagnosis not present

## 2022-04-07 DIAGNOSIS — G4733 Obstructive sleep apnea (adult) (pediatric): Secondary | ICD-10-CM | POA: Diagnosis not present

## 2022-04-07 DIAGNOSIS — D696 Thrombocytopenia, unspecified: Secondary | ICD-10-CM | POA: Diagnosis not present

## 2022-04-07 DIAGNOSIS — I214 Non-ST elevation (NSTEMI) myocardial infarction: Secondary | ICD-10-CM | POA: Diagnosis not present

## 2022-04-07 DIAGNOSIS — Z993 Dependence on wheelchair: Secondary | ICD-10-CM | POA: Diagnosis not present

## 2022-04-07 DIAGNOSIS — Z9181 History of falling: Secondary | ICD-10-CM | POA: Diagnosis not present

## 2022-04-07 DIAGNOSIS — I5031 Acute diastolic (congestive) heart failure: Secondary | ICD-10-CM | POA: Diagnosis not present

## 2022-04-07 DIAGNOSIS — N39498 Other specified urinary incontinence: Secondary | ICD-10-CM | POA: Diagnosis not present

## 2022-04-07 DIAGNOSIS — N401 Enlarged prostate with lower urinary tract symptoms: Secondary | ICD-10-CM | POA: Diagnosis not present

## 2022-04-07 DIAGNOSIS — Z7902 Long term (current) use of antithrombotics/antiplatelets: Secondary | ICD-10-CM | POA: Diagnosis not present

## 2022-04-07 DIAGNOSIS — Z955 Presence of coronary angioplasty implant and graft: Secondary | ICD-10-CM | POA: Diagnosis not present

## 2022-04-07 DIAGNOSIS — I4821 Permanent atrial fibrillation: Secondary | ICD-10-CM | POA: Diagnosis not present

## 2022-04-11 ENCOUNTER — Telehealth (HOSPITAL_COMMUNITY): Payer: Self-pay

## 2022-04-11 NOTE — Telephone Encounter (Signed)
Called to confirm Heart & Vascular Transitions of Care appointment at 04/12/22 @ 3:00pm. Patient reminded to bring all medications and pill box organizer with them. Gave directions, instructed to utilize [facility] parking.  Left message to confirm appointment.

## 2022-04-12 ENCOUNTER — Encounter (HOSPITAL_COMMUNITY): Payer: Self-pay

## 2022-04-13 DIAGNOSIS — M47812 Spondylosis without myelopathy or radiculopathy, cervical region: Secondary | ICD-10-CM | POA: Diagnosis not present

## 2022-04-13 DIAGNOSIS — Z955 Presence of coronary angioplasty implant and graft: Secondary | ICD-10-CM | POA: Diagnosis not present

## 2022-04-13 DIAGNOSIS — Z7984 Long term (current) use of oral hypoglycemic drugs: Secondary | ICD-10-CM | POA: Diagnosis not present

## 2022-04-13 DIAGNOSIS — Z7982 Long term (current) use of aspirin: Secondary | ICD-10-CM | POA: Diagnosis not present

## 2022-04-13 DIAGNOSIS — D696 Thrombocytopenia, unspecified: Secondary | ICD-10-CM | POA: Diagnosis not present

## 2022-04-13 DIAGNOSIS — Z7902 Long term (current) use of antithrombotics/antiplatelets: Secondary | ICD-10-CM | POA: Diagnosis not present

## 2022-04-13 DIAGNOSIS — I110 Hypertensive heart disease with heart failure: Secondary | ICD-10-CM | POA: Diagnosis not present

## 2022-04-13 DIAGNOSIS — N401 Enlarged prostate with lower urinary tract symptoms: Secondary | ICD-10-CM | POA: Diagnosis not present

## 2022-04-13 DIAGNOSIS — I4821 Permanent atrial fibrillation: Secondary | ICD-10-CM | POA: Diagnosis not present

## 2022-04-13 DIAGNOSIS — N39498 Other specified urinary incontinence: Secondary | ICD-10-CM | POA: Diagnosis not present

## 2022-04-13 DIAGNOSIS — E1165 Type 2 diabetes mellitus with hyperglycemia: Secondary | ICD-10-CM | POA: Diagnosis not present

## 2022-04-13 DIAGNOSIS — Z993 Dependence on wheelchair: Secondary | ICD-10-CM | POA: Diagnosis not present

## 2022-04-13 DIAGNOSIS — I214 Non-ST elevation (NSTEMI) myocardial infarction: Secondary | ICD-10-CM | POA: Diagnosis not present

## 2022-04-13 DIAGNOSIS — L89893 Pressure ulcer of other site, stage 3: Secondary | ICD-10-CM | POA: Diagnosis not present

## 2022-04-13 DIAGNOSIS — I5031 Acute diastolic (congestive) heart failure: Secondary | ICD-10-CM | POA: Diagnosis not present

## 2022-04-13 DIAGNOSIS — G4733 Obstructive sleep apnea (adult) (pediatric): Secondary | ICD-10-CM | POA: Diagnosis not present

## 2022-04-14 DIAGNOSIS — I214 Non-ST elevation (NSTEMI) myocardial infarction: Secondary | ICD-10-CM | POA: Diagnosis not present

## 2022-04-14 DIAGNOSIS — Z7982 Long term (current) use of aspirin: Secondary | ICD-10-CM | POA: Diagnosis not present

## 2022-04-14 DIAGNOSIS — Z7984 Long term (current) use of oral hypoglycemic drugs: Secondary | ICD-10-CM | POA: Diagnosis not present

## 2022-04-14 DIAGNOSIS — G4733 Obstructive sleep apnea (adult) (pediatric): Secondary | ICD-10-CM | POA: Diagnosis not present

## 2022-04-14 DIAGNOSIS — I5031 Acute diastolic (congestive) heart failure: Secondary | ICD-10-CM | POA: Diagnosis not present

## 2022-04-14 DIAGNOSIS — I110 Hypertensive heart disease with heart failure: Secondary | ICD-10-CM | POA: Diagnosis not present

## 2022-04-14 DIAGNOSIS — N401 Enlarged prostate with lower urinary tract symptoms: Secondary | ICD-10-CM | POA: Diagnosis not present

## 2022-04-14 DIAGNOSIS — E1165 Type 2 diabetes mellitus with hyperglycemia: Secondary | ICD-10-CM | POA: Diagnosis not present

## 2022-04-14 DIAGNOSIS — Z7902 Long term (current) use of antithrombotics/antiplatelets: Secondary | ICD-10-CM | POA: Diagnosis not present

## 2022-04-14 DIAGNOSIS — D696 Thrombocytopenia, unspecified: Secondary | ICD-10-CM | POA: Diagnosis not present

## 2022-04-14 DIAGNOSIS — Z993 Dependence on wheelchair: Secondary | ICD-10-CM | POA: Diagnosis not present

## 2022-04-14 DIAGNOSIS — L89893 Pressure ulcer of other site, stage 3: Secondary | ICD-10-CM | POA: Diagnosis not present

## 2022-04-14 DIAGNOSIS — I4821 Permanent atrial fibrillation: Secondary | ICD-10-CM | POA: Diagnosis not present

## 2022-04-14 DIAGNOSIS — M47812 Spondylosis without myelopathy or radiculopathy, cervical region: Secondary | ICD-10-CM | POA: Diagnosis not present

## 2022-04-14 DIAGNOSIS — N39498 Other specified urinary incontinence: Secondary | ICD-10-CM | POA: Diagnosis not present

## 2022-04-14 DIAGNOSIS — Z955 Presence of coronary angioplasty implant and graft: Secondary | ICD-10-CM | POA: Diagnosis not present

## 2022-04-18 DIAGNOSIS — Z7982 Long term (current) use of aspirin: Secondary | ICD-10-CM | POA: Diagnosis not present

## 2022-04-18 DIAGNOSIS — Z7902 Long term (current) use of antithrombotics/antiplatelets: Secondary | ICD-10-CM | POA: Diagnosis not present

## 2022-04-18 DIAGNOSIS — L89893 Pressure ulcer of other site, stage 3: Secondary | ICD-10-CM | POA: Diagnosis not present

## 2022-04-18 DIAGNOSIS — I5031 Acute diastolic (congestive) heart failure: Secondary | ICD-10-CM | POA: Diagnosis not present

## 2022-04-18 DIAGNOSIS — G4733 Obstructive sleep apnea (adult) (pediatric): Secondary | ICD-10-CM | POA: Diagnosis not present

## 2022-04-18 DIAGNOSIS — I110 Hypertensive heart disease with heart failure: Secondary | ICD-10-CM | POA: Diagnosis not present

## 2022-04-18 DIAGNOSIS — Z7984 Long term (current) use of oral hypoglycemic drugs: Secondary | ICD-10-CM | POA: Diagnosis not present

## 2022-04-18 DIAGNOSIS — Z955 Presence of coronary angioplasty implant and graft: Secondary | ICD-10-CM | POA: Diagnosis not present

## 2022-04-18 DIAGNOSIS — I214 Non-ST elevation (NSTEMI) myocardial infarction: Secondary | ICD-10-CM | POA: Diagnosis not present

## 2022-04-18 DIAGNOSIS — I4821 Permanent atrial fibrillation: Secondary | ICD-10-CM | POA: Diagnosis not present

## 2022-04-18 DIAGNOSIS — D696 Thrombocytopenia, unspecified: Secondary | ICD-10-CM | POA: Diagnosis not present

## 2022-04-18 DIAGNOSIS — N39498 Other specified urinary incontinence: Secondary | ICD-10-CM | POA: Diagnosis not present

## 2022-04-18 DIAGNOSIS — Z993 Dependence on wheelchair: Secondary | ICD-10-CM | POA: Diagnosis not present

## 2022-04-18 DIAGNOSIS — E1165 Type 2 diabetes mellitus with hyperglycemia: Secondary | ICD-10-CM | POA: Diagnosis not present

## 2022-04-18 DIAGNOSIS — M47812 Spondylosis without myelopathy or radiculopathy, cervical region: Secondary | ICD-10-CM | POA: Diagnosis not present

## 2022-04-18 DIAGNOSIS — N401 Enlarged prostate with lower urinary tract symptoms: Secondary | ICD-10-CM | POA: Diagnosis not present

## 2022-04-19 DIAGNOSIS — I214 Non-ST elevation (NSTEMI) myocardial infarction: Secondary | ICD-10-CM | POA: Diagnosis not present

## 2022-04-19 DIAGNOSIS — Z955 Presence of coronary angioplasty implant and graft: Secondary | ICD-10-CM | POA: Diagnosis not present

## 2022-04-19 DIAGNOSIS — I4821 Permanent atrial fibrillation: Secondary | ICD-10-CM | POA: Diagnosis not present

## 2022-04-19 DIAGNOSIS — D696 Thrombocytopenia, unspecified: Secondary | ICD-10-CM | POA: Diagnosis not present

## 2022-04-19 DIAGNOSIS — Z7984 Long term (current) use of oral hypoglycemic drugs: Secondary | ICD-10-CM | POA: Diagnosis not present

## 2022-04-19 DIAGNOSIS — L89893 Pressure ulcer of other site, stage 3: Secondary | ICD-10-CM | POA: Diagnosis not present

## 2022-04-19 DIAGNOSIS — Z7902 Long term (current) use of antithrombotics/antiplatelets: Secondary | ICD-10-CM | POA: Diagnosis not present

## 2022-04-19 DIAGNOSIS — N401 Enlarged prostate with lower urinary tract symptoms: Secondary | ICD-10-CM | POA: Diagnosis not present

## 2022-04-19 DIAGNOSIS — I5031 Acute diastolic (congestive) heart failure: Secondary | ICD-10-CM | POA: Diagnosis not present

## 2022-04-19 DIAGNOSIS — Z7982 Long term (current) use of aspirin: Secondary | ICD-10-CM | POA: Diagnosis not present

## 2022-04-19 DIAGNOSIS — E1165 Type 2 diabetes mellitus with hyperglycemia: Secondary | ICD-10-CM | POA: Diagnosis not present

## 2022-04-19 DIAGNOSIS — I110 Hypertensive heart disease with heart failure: Secondary | ICD-10-CM | POA: Diagnosis not present

## 2022-04-19 DIAGNOSIS — N39498 Other specified urinary incontinence: Secondary | ICD-10-CM | POA: Diagnosis not present

## 2022-04-19 DIAGNOSIS — M47812 Spondylosis without myelopathy or radiculopathy, cervical region: Secondary | ICD-10-CM | POA: Diagnosis not present

## 2022-04-19 DIAGNOSIS — G4733 Obstructive sleep apnea (adult) (pediatric): Secondary | ICD-10-CM | POA: Diagnosis not present

## 2022-04-19 DIAGNOSIS — Z993 Dependence on wheelchair: Secondary | ICD-10-CM | POA: Diagnosis not present

## 2022-04-20 ENCOUNTER — Telehealth (HOSPITAL_COMMUNITY): Payer: Self-pay

## 2022-04-20 DIAGNOSIS — E1165 Type 2 diabetes mellitus with hyperglycemia: Secondary | ICD-10-CM | POA: Diagnosis not present

## 2022-04-20 DIAGNOSIS — I110 Hypertensive heart disease with heart failure: Secondary | ICD-10-CM | POA: Diagnosis not present

## 2022-04-20 DIAGNOSIS — Z7982 Long term (current) use of aspirin: Secondary | ICD-10-CM | POA: Diagnosis not present

## 2022-04-20 DIAGNOSIS — Z7984 Long term (current) use of oral hypoglycemic drugs: Secondary | ICD-10-CM | POA: Diagnosis not present

## 2022-04-20 DIAGNOSIS — N401 Enlarged prostate with lower urinary tract symptoms: Secondary | ICD-10-CM | POA: Diagnosis not present

## 2022-04-20 DIAGNOSIS — D696 Thrombocytopenia, unspecified: Secondary | ICD-10-CM | POA: Diagnosis not present

## 2022-04-20 DIAGNOSIS — Z955 Presence of coronary angioplasty implant and graft: Secondary | ICD-10-CM | POA: Diagnosis not present

## 2022-04-20 DIAGNOSIS — G4733 Obstructive sleep apnea (adult) (pediatric): Secondary | ICD-10-CM | POA: Diagnosis not present

## 2022-04-20 DIAGNOSIS — I5031 Acute diastolic (congestive) heart failure: Secondary | ICD-10-CM | POA: Diagnosis not present

## 2022-04-20 DIAGNOSIS — I214 Non-ST elevation (NSTEMI) myocardial infarction: Secondary | ICD-10-CM | POA: Diagnosis not present

## 2022-04-20 DIAGNOSIS — I4821 Permanent atrial fibrillation: Secondary | ICD-10-CM | POA: Diagnosis not present

## 2022-04-20 DIAGNOSIS — Z993 Dependence on wheelchair: Secondary | ICD-10-CM | POA: Diagnosis not present

## 2022-04-20 DIAGNOSIS — M47812 Spondylosis without myelopathy or radiculopathy, cervical region: Secondary | ICD-10-CM | POA: Diagnosis not present

## 2022-04-20 DIAGNOSIS — L89893 Pressure ulcer of other site, stage 3: Secondary | ICD-10-CM | POA: Diagnosis not present

## 2022-04-20 DIAGNOSIS — N39498 Other specified urinary incontinence: Secondary | ICD-10-CM | POA: Diagnosis not present

## 2022-04-20 DIAGNOSIS — Z7902 Long term (current) use of antithrombotics/antiplatelets: Secondary | ICD-10-CM | POA: Diagnosis not present

## 2022-04-20 NOTE — Progress Notes (Signed)
HEART & VASCULAR TRANSITION OF CARE CONSULT NOTE     Referring Physician:Dr Crenshaw Primary Care: Primary Cardiologist: Dr Domenic Polite Ortho: Dr Almyra Deforest  HPI: Referred to clinic by Dr Stanford Breed for heart failure consultation.   Arthur Wilson is a 77 year old with a history of  permanent A fib, NSTEMI,  OSA, and HFpEF.   Recenltly had a fall at home that damaged his R knee. He was seen by ortho but due to edema he was asked to follow up with his PCP.  At PCP office he was hypotensive and sent to the ED.  HS Trop> 3000, BNP 531. Admitted 03/28/22 with shock and NSTEM. Transferred to [facility]. Echo showed EF 50-55% with WMA. Underwent cath and stents. Diuresed  19 pounds with IV lasix and transitioned to lasix 40 mg daily.  Plan to continue maintained on triple therapy for 1 month and then Plavix and Eliquis for 6 months in total and then Eliquis indefinitely. Discharged 04/04/22 with addition of GDMT. Discharge weight 227 pounds.   Overall feeling ok. Feels a little stronger everyday. Limited by R knee. Denies SOB/PND/Orthopnea. Denies chest pain. HH following  for PT. Appetite ok. No fever or chills. Weight at home  trending down 219 pounds. He has not been using CPAP. Having difficulty sleeping. Taking all medications.   Cardiac Testing  Echo 03/2022  1.Left ventricular ejection fraction, by estimation, is 50 to 55%. The  left ventricle has low normal function. The left ventricle demonstrates  regional wall motion abnormalities - cannot exclude mild inferior mid to  basal hypokinesis. There is mild  left ventricular hypertrophy. Left ventricular diastolic parameters are  indeterminate.   2. Right ventricular systolic function is mildly reduced. The right  ventricular size is normal. There is normal pulmonary artery systolic  pressure. The estimated right ventricular systolic pressure is 95.0 mmHg.   3. Left atrial size was moderately dilated.   4. Right atrial size was mildly dilated.   5. The  mitral valve is grossly normal. Mild to moderate mitral valve  regurgitation.   6. The aortic valve is tricuspid. There is mild calcification of the  aortic valve. Aortic valve regurgitation is not visualized.   7. The inferior vena cava is normal in size with greater than 50%  respiratory variability, suggesting right atrial pressure of 3 mmHg.   Cath 03/30/22    Prox LAD lesion is 75% stenosed.   1st Mrg lesion is 90% stenosed.   Ost RCA to Prox RCA lesion is 60% stenosed.   Prox RCA lesion is 70% stenosed.   A stent was successfully placed. 1.  High-grade disease of proximal LAD and obtuse marginal with RFR positive ostial and proximal to mid right coronary artery disease. 2.  After review with Dr. Marlou Porch, PCI of the right coronary artery was pursued with 2 overlapping drug-eluting stents placed. 3.  Due to dye expenditure, PCI of the proximal LAD and left circumflex will be staged depending on renal function. 4.  LVEDP of 9 mmHg. 5.  Fick cardiac output of 5.5 L/min and Fick cardiac index of 2.4 L/min/m with a mean RA pressure of 11 mmHg, mean wedge pressure of 18 mmHg, and mean PA pressure of 30 mmHg. 6.  Very difficult cannulation of right coronary artery; please see procedural details.  Recommendation: Given history of atrial fibrillation the patient will be maintained on triple therapy for 1 month and then Plavix and Eliquis for 6 months in total and then Eliquis  indefinitely.   Cath 04/03/22   Prox LAD lesion is 75% stenosed.   1st Mrg lesion is 90% stenosed.   Non-stenotic Ost RCA to Prox RCA lesion was previously treated.   Non-stenotic Prox RCA lesion was previously treated.   A stent was successfully placed.   Post intervention, there is a 0% residual stenosis.  1.  Successful PCI of proximal LAD aided by orbital atherectomy with OCT optimization. 2.  Residual high-grade and highly calcified lesion of the obtuse marginal; given the relatively small territory subtended by this  vessel and that the patient's symptoms are dyspnea rather than angina, after discussion with Dr. Marlou Porch medical therapy will be pursued for this burden of disease. 3.  LVEDP of 10 mmHg but obscured by widely fluctuating RR intervals due to atrial fibrillation. Review of Systems: [y] = yes, '[ ]'$  = no   General: Weight gain '[ ]'$ ; Weight loss '[ ]'$ ; Anorexia '[ ]'$ ; Fatigue [ Y]; Fever '[ ]'$ ; Chills '[ ]'$ ; Weakness '[ ]'$   Cardiac: Chest pain/pressure '[ ]'$ ; Resting SOB '[ ]'$ ; Exertional SOB '[ ]'$ ; Orthopnea '[ ]'$ ; Pedal Edema [Y ]; Palpitations '[ ]'$ ; Syncope '[ ]'$ ; Presyncope '[ ]'$ ; Paroxysmal nocturnal dyspnea'[ ]'$   Pulmonary: Cough '[ ]'$ ; Wheezing'[ ]'$ ; Hemoptysis'[ ]'$ ; Sputum '[ ]'$ ; Snoring '[ ]'$   GI: Vomiting'[ ]'$ ; Dysphagia'[ ]'$ ; Melena'[ ]'$ ; Hematochezia '[ ]'$ ; Heartburn'[ ]'$ ; Abdominal pain '[ ]'$ ; Constipation '[ ]'$ ; Diarrhea '[ ]'$ ; BRBPR '[ ]'$   GU: Hematuria'[ ]'$ ; Dysuria '[ ]'$ ; Nocturia'[ ]'$   Vascular: Pain in legs with walking '[ ]'$ ; Pain in feet with lying flat '[ ]'$ ; Non-healing sores '[ ]'$ ; Stroke '[ ]'$ ; TIA '[ ]'$ ; Slurred speech '[ ]'$ ;  Neuro: Headaches'[ ]'$ ; Vertigo'[ ]'$ ; Seizures'[ ]'$ ; Paresthesias'[ ]'$ ;Blurred vision '[ ]'$ ; Diplopia '[ ]'$ ; Vision changes '[ ]'$   Ortho/Skin: Arthritis '[ ]'$ ; Joint pain [ Y]; Muscle pain '[ ]'$ ; Joint swelling '[ ]'$ ; Back Pain [ Y]; Rash '[ ]'$   Psych: Depression'[ ]'$ ; Anxiety'[ ]'$   Heme: Bleeding problems '[ ]'$ ; Clotting disorders '[ ]'$ ; Anemia '[ ]'$   Endocrine: Diabetes '[ ]'$ ; Thyroid dysfunction'[ ]'$    Past Medical History:  Diagnosis Date   Atrial fibrillation (HCC)    BPH (benign prostatic hyperplasia)    Cervical spondylosis without myelopathy    Essential hypertension    Impaired glucose tolerance    OSA on CPAP    Testicular hypofunction     Current Outpatient Medications  Medication Sig Dispense Refill   acetaminophen (TYLENOL) 500 MG tablet Take 1,000 mg by mouth every 6 (six) hours as needed for moderate pain.     ALPRAZolam (XANAX) 0.5 MG tablet Take 0.5 tablets by mouth at bedtime as needed for anxiety or sleep.     aspirin 81 MG  chewable tablet Chew 1 tablet (81 mg total) by mouth daily. 30 tablet 0   atorvastatin (LIPITOR) 40 MG tablet Take 1 tablet (40 mg total) by mouth daily. 30 tablet 0   clopidogrel (PLAVIX) 75 MG tablet Take 1 tablet (75 mg total) by mouth daily with breakfast. 30 tablet 1   ELIQUIS 5 MG TABS tablet Take 5 mg by mouth 2 (two) times daily.     empagliflozin (JARDIANCE) 10 MG TABS tablet Take 1 tablet (10 mg total) by mouth daily. 30 tablet 0   fexofenadine (ALLEGRA) 180 MG tablet Take 180 mg by mouth as needed for allergies or rhinitis.     fluticasone (FLONASE) 50 MCG/ACT nasal spray Place 1 spray into both nostrils as needed  for allergies.     furosemide (LASIX) 40 MG tablet Take 40 mg by mouth daily.     HYDROcodone-acetaminophen (NORCO) 7.5-325 MG tablet Take 0.5-1 tablets by mouth 2 (two) times daily as needed for moderate pain.     metFORMIN (GLUCOPHAGE) 500 MG tablet Take 500 mg by mouth 2 (two) times daily with a meal.     metoprolol succinate (TOPROL-XL) 100 MG 24 hr tablet Take 1 tablet (100 mg total) by mouth 2 (two) times daily. Take with or immediately following a meal. 60 tablet 0   pantoprazole (PROTONIX) 40 MG tablet Take 1 tablet (40 mg total) by mouth daily at 12 noon. 30 tablet 0   polyethylene glycol powder (GLYCOLAX/MIRALAX) 17 GM/SCOOP powder Take 1 capful (17 g) by mouth daily as needed for mild constipation. 238 g 0   potassium chloride SA (KLOR-CON M) 20 MEQ tablet Take 1 tablet (20 mEq total) by mouth daily. 30 tablet 0   No current facility-administered medications for this encounter.    No Known Allergies    Social History   Socioeconomic History   Marital status: Married    Spouse name: Manuela Schwartz   Number of children: 3   Years of education: BS-Pharm   Highest education level: Not on file  Occupational History   Occupation: pharmacist     Comment: Agricultural engineer  Tobacco Use   Smoking status: Never   Smokeless tobacco: Never  Vaping Use   Vaping Use:  Never used  Substance and Sexual Activity   Alcohol use: No   Drug use: No   Sexual activity: Not on file  Other Topics Concern   Not on file  Social History Narrative   Lives with wife   Patient is right handed.   Patient drinks caffeine a few times a week.   Social Determinants of Health   Financial Resource Strain: Low Risk  (03/30/2022)   Overall Financial Resource Strain (CARDIA)    Difficulty of Paying Living Expenses: Not hard at all  Food Insecurity: No Food Insecurity (03/29/2022)   Hunger Vital Sign    Worried About Running Out of Food in the Last Year: Never true    Ran Out of Food in the Last Year: Never true  Transportation Needs: No Transportation Needs (03/29/2022)   PRAPARE - Hydrologist (Medical): No    Lack of Transportation (Non-Medical): No  Physical Activity: Not on file  Stress: Not on file  Social Connections: Not on file  Intimate Partner Violence: Not At Risk (03/29/2022)   Humiliation, Afraid, Rape, and Kick questionnaire    Fear of Current or Ex-Partner: No    Emotionally Abused: No    Physically Abused: No    Sexually Abused: No      Family History  Problem Relation Age of Onset   Breast cancer Mother    Hypertension Father    Heart attack Father    Breast cancer Sister    Hypertension Paternal Grandfather    Prostate cancer Paternal Uncle     Vitals:   04/21/22 1013  BP: 100/60  Pulse: 96  SpO2: 96%  Weight: 100.5 kg (221 lb 9.6 oz)   Wt Readings from Last 3 Encounters:  04/21/22 100.5 kg (221 lb 9.6 oz)  04/04/22 103.1 kg (227 lb 4.7 oz)  12/06/21 111.8 kg (246 lb 6.4 oz)    PHYSICAL EXAM: General:  Arrived in a wheelchair. No respiratory difficulty HEENT: normal Neck: supple. no JVD.  Carotids 2+ bilat; no bruits. No lymphadenopathy or thryomegaly appreciated. Cor: PMI nondisplaced. Irregular rate & rhythm. No rubs, gallops or murmurs. Lungs: clear Abdomen: soft, nontender, nondistended. No  hepatosplenomegaly. No bruits or masses. Good bowel sounds. Extremities: no cyanosis, clubbing, rash, R and LLE trace lower extremity pain.  Neuro: alert & oriented x 3, cranial nerves grossly intact. moves all 4 extremities w/o difficulty. Affect pleasant.  ECG:A fib 92 bpm    ASSESSMENT & PLAN: 1. HFpEF  Echo EF 50-55% RV mildly reduced. previously EF 60-65%.  NYHA II.  GDMT  Diuretic- Appears euvolemic. Check labs and adjust diuretics as needed. Needs to wear compression stockings.  BB- Continue Toprol XL at current dose.  Ace/ARB/ARNI- Hold off for now.  MRA- Hold off for now.  SGLT2i- Continue farxiga 10 mg daily   2. Permanent A fib.  Rate controlled. Continue current Toprol xl  dose.  Continue eliquis   3. CAD  03/2022  PCI of RCA and LAD Plan  to continue aspirin.  Triple therapy  4 weeks until 04/03/2022 then Plavix and Eliquis for 6 months in total then Eliquis monotherapy thereafter.  - No chest pain.   4. OSA -Has not been using CPAP.  -Asked him to start using nightly.   Check BMET Referred to HFSW (PCP, Medications, Transportation, ETOH Abuse, Drug Abuse, Insurance, Financial ): No Refer to Pharmacy: No Refer to Home Health:  Refer to Advanced Heart Failure Clinic: No  Refer to General Cardiology: He has follow with Dr Domenic Polite.   Follow up as needed    Legna Mausolf NP-C  10:21 AM

## 2022-04-20 NOTE — Telephone Encounter (Signed)
Called to confirm Heart & Vascular Transitions of Care appointment at 04/21/22. Patient reminded to bring all medications and pill box organizer with them. Confirmed patient has transportation. Gave directions, instructed to utilize [place] parking.  Confirmed appointment prior to ending call.

## 2022-04-21 ENCOUNTER — Ambulatory Visit (HOSPITAL_COMMUNITY)
Admission: RE | Admit: 2022-04-21 | Discharge: 2022-04-21 | Disposition: A | Discharge status: Home / Self Care | Payer: Medicare Other | Source: Ambulatory Visit | Attending: Adult Health | Admitting: Adult Health

## 2022-04-21 ENCOUNTER — Encounter (HOSPITAL_COMMUNITY): Payer: Self-pay

## 2022-04-21 ENCOUNTER — Telehealth: Payer: Self-pay | Admitting: Cardiology

## 2022-04-21 VITALS — BP 100/60 | HR 96 | Wt 221.6 lb

## 2022-04-21 DIAGNOSIS — G4733 Obstructive sleep apnea (adult) (pediatric): Secondary | ICD-10-CM | POA: Diagnosis not present

## 2022-04-21 DIAGNOSIS — I110 Hypertensive heart disease with heart failure: Secondary | ICD-10-CM | POA: Insufficient documentation

## 2022-04-21 DIAGNOSIS — I5032 Chronic diastolic (congestive) heart failure: Secondary | ICD-10-CM | POA: Diagnosis not present

## 2022-04-21 DIAGNOSIS — Z7901 Long term (current) use of anticoagulants: Secondary | ICD-10-CM | POA: Diagnosis not present

## 2022-04-21 DIAGNOSIS — Z955 Presence of coronary angioplasty implant and graft: Secondary | ICD-10-CM | POA: Insufficient documentation

## 2022-04-21 DIAGNOSIS — I2510 Atherosclerotic heart disease of native coronary artery without angina pectoris: Secondary | ICD-10-CM | POA: Diagnosis not present

## 2022-04-21 DIAGNOSIS — Z8249 Family history of ischemic heart disease and other diseases of the circulatory system: Secondary | ICD-10-CM | POA: Diagnosis not present

## 2022-04-21 DIAGNOSIS — Z7984 Long term (current) use of oral hypoglycemic drugs: Secondary | ICD-10-CM | POA: Insufficient documentation

## 2022-04-21 DIAGNOSIS — I4821 Permanent atrial fibrillation: Secondary | ICD-10-CM | POA: Insufficient documentation

## 2022-04-21 DIAGNOSIS — Z79899 Other long term (current) drug therapy: Secondary | ICD-10-CM | POA: Diagnosis not present

## 2022-04-21 LAB — BASIC METABOLIC PANEL
Anion gap: 9 (ref 5–15)
BUN: 28 mg/dL — ABNORMAL HIGH (ref 8–23)
CO2: 28 mmol/L (ref 22–32)
Calcium: 8.6 mg/dL — ABNORMAL LOW (ref 8.9–10.3)
Chloride: 98 mmol/L (ref 98–111)
Creatinine, Ser: 1.82 mg/dL — ABNORMAL HIGH (ref 0.61–1.24)
GFR, Estimated: 38 mL/min — ABNORMAL LOW (ref >60)
Glucose, Bld: 115 mg/dL — ABNORMAL HIGH (ref 70–99)
Potassium: 3.8 mmol/L (ref 3.5–5.1)
Sodium: 135 mmol/L (ref 135–145)

## 2022-04-21 NOTE — Patient Instructions (Addendum)
EKG done today.  Labs done today. We will contact you only if your labs are abnormal.  STOP taking Aspirin   No other medication changes were made. Please continue all current medications as prescribed.  Thank you for allowing [place] to provide your heart failure care after your recent hospitalization. Please follow-up with General Cardiology.

## 2022-04-21 NOTE — Telephone Encounter (Signed)
Patient called the answering service for clarification on a MyChart message he had received earlier today. Mychart message stated "Stop lasix and potassium. Change lasix to as needed. Needs repeat BMET next week. "   Patient was confused as to when to take lasix. Instructed patient to take lasix when he notices that he gains 3 lbs or more in a day or 5 lbs or more in a week. Also instructed him to take lasix when he notices that he is having shortness of breath, abdominal distention, or ankle edema. Patient understands that he is to return in 1 week to have blood drawn.   Margie Billet, PA-C 04/21/2022 5:58 PM

## 2022-04-24 ENCOUNTER — Telehealth (HOSPITAL_COMMUNITY): Payer: Self-pay

## 2022-04-24 DIAGNOSIS — I5031 Acute diastolic (congestive) heart failure: Secondary | ICD-10-CM | POA: Diagnosis not present

## 2022-04-24 DIAGNOSIS — Z955 Presence of coronary angioplasty implant and graft: Secondary | ICD-10-CM | POA: Diagnosis not present

## 2022-04-24 DIAGNOSIS — I214 Non-ST elevation (NSTEMI) myocardial infarction: Secondary | ICD-10-CM | POA: Diagnosis not present

## 2022-04-24 DIAGNOSIS — M47812 Spondylosis without myelopathy or radiculopathy, cervical region: Secondary | ICD-10-CM | POA: Diagnosis not present

## 2022-04-24 DIAGNOSIS — N39498 Other specified urinary incontinence: Secondary | ICD-10-CM | POA: Diagnosis not present

## 2022-04-24 DIAGNOSIS — Z7982 Long term (current) use of aspirin: Secondary | ICD-10-CM | POA: Diagnosis not present

## 2022-04-24 DIAGNOSIS — Z993 Dependence on wheelchair: Secondary | ICD-10-CM | POA: Diagnosis not present

## 2022-04-24 DIAGNOSIS — E1165 Type 2 diabetes mellitus with hyperglycemia: Secondary | ICD-10-CM | POA: Diagnosis not present

## 2022-04-24 DIAGNOSIS — Z7984 Long term (current) use of oral hypoglycemic drugs: Secondary | ICD-10-CM | POA: Diagnosis not present

## 2022-04-24 DIAGNOSIS — L89893 Pressure ulcer of other site, stage 3: Secondary | ICD-10-CM | POA: Diagnosis not present

## 2022-04-24 DIAGNOSIS — D696 Thrombocytopenia, unspecified: Secondary | ICD-10-CM | POA: Diagnosis not present

## 2022-04-24 DIAGNOSIS — I110 Hypertensive heart disease with heart failure: Secondary | ICD-10-CM | POA: Diagnosis not present

## 2022-04-24 DIAGNOSIS — Z7902 Long term (current) use of antithrombotics/antiplatelets: Secondary | ICD-10-CM | POA: Diagnosis not present

## 2022-04-24 DIAGNOSIS — N401 Enlarged prostate with lower urinary tract symptoms: Secondary | ICD-10-CM | POA: Diagnosis not present

## 2022-04-24 DIAGNOSIS — G4733 Obstructive sleep apnea (adult) (pediatric): Secondary | ICD-10-CM | POA: Diagnosis not present

## 2022-04-24 DIAGNOSIS — I4821 Permanent atrial fibrillation: Secondary | ICD-10-CM | POA: Diagnosis not present

## 2022-04-24 MED ORDER — FUROSEMIDE 40 MG PO TABS: 40.0000 mg | ORAL_TABLET | Freq: Every day | ORAL | 2 refills | Status: DC | PRN

## 2022-04-24 NOTE — Telephone Encounter (Signed)
Patient advised and verbalized understanding,lab appointment scheduled,lab orders entered, med list updated to reflect changes.   Meds ordered this encounter  Medications   furosemide (LASIX) 40 MG tablet    Sig: Take 1 tablet (40 mg total) by mouth daily as needed for fluid or edema.    Dispense:  30 tablet    Refill:  2    Please cancel all previous orders for current medication. Change in dosage or pill size. Place on hold until patient needs them. D/C potassium

## 2022-04-24 NOTE — Telephone Encounter (Signed)
-----  Message from Conrad Coahoma, NP sent at 04/21/2022 12:19 PM EST ----- Creatinine elevated ---> Please call. Stop lasix and potassium. Change lasix to as needed. Needs repeat BMET next week.

## 2022-04-25 DIAGNOSIS — I5031 Acute diastolic (congestive) heart failure: Secondary | ICD-10-CM | POA: Diagnosis not present

## 2022-04-25 DIAGNOSIS — I4821 Permanent atrial fibrillation: Secondary | ICD-10-CM | POA: Diagnosis not present

## 2022-04-25 DIAGNOSIS — M47812 Spondylosis without myelopathy or radiculopathy, cervical region: Secondary | ICD-10-CM | POA: Diagnosis not present

## 2022-04-25 DIAGNOSIS — Z7902 Long term (current) use of antithrombotics/antiplatelets: Secondary | ICD-10-CM | POA: Diagnosis not present

## 2022-04-25 DIAGNOSIS — D696 Thrombocytopenia, unspecified: Secondary | ICD-10-CM | POA: Diagnosis not present

## 2022-04-25 DIAGNOSIS — I214 Non-ST elevation (NSTEMI) myocardial infarction: Secondary | ICD-10-CM | POA: Diagnosis not present

## 2022-04-25 DIAGNOSIS — Z7982 Long term (current) use of aspirin: Secondary | ICD-10-CM | POA: Diagnosis not present

## 2022-04-25 DIAGNOSIS — Z7984 Long term (current) use of oral hypoglycemic drugs: Secondary | ICD-10-CM | POA: Diagnosis not present

## 2022-04-25 DIAGNOSIS — N401 Enlarged prostate with lower urinary tract symptoms: Secondary | ICD-10-CM | POA: Diagnosis not present

## 2022-04-25 DIAGNOSIS — E1165 Type 2 diabetes mellitus with hyperglycemia: Secondary | ICD-10-CM | POA: Diagnosis not present

## 2022-04-25 DIAGNOSIS — N39498 Other specified urinary incontinence: Secondary | ICD-10-CM | POA: Diagnosis not present

## 2022-04-25 DIAGNOSIS — Z993 Dependence on wheelchair: Secondary | ICD-10-CM | POA: Diagnosis not present

## 2022-04-25 DIAGNOSIS — I110 Hypertensive heart disease with heart failure: Secondary | ICD-10-CM | POA: Diagnosis not present

## 2022-04-25 DIAGNOSIS — G4733 Obstructive sleep apnea (adult) (pediatric): Secondary | ICD-10-CM | POA: Diagnosis not present

## 2022-04-25 DIAGNOSIS — Z955 Presence of coronary angioplasty implant and graft: Secondary | ICD-10-CM | POA: Diagnosis not present

## 2022-04-25 DIAGNOSIS — L89893 Pressure ulcer of other site, stage 3: Secondary | ICD-10-CM | POA: Diagnosis not present

## 2022-04-26 DIAGNOSIS — Z993 Dependence on wheelchair: Secondary | ICD-10-CM | POA: Diagnosis not present

## 2022-04-26 DIAGNOSIS — Z7984 Long term (current) use of oral hypoglycemic drugs: Secondary | ICD-10-CM | POA: Diagnosis not present

## 2022-04-26 DIAGNOSIS — Z7902 Long term (current) use of antithrombotics/antiplatelets: Secondary | ICD-10-CM | POA: Diagnosis not present

## 2022-04-26 DIAGNOSIS — L89893 Pressure ulcer of other site, stage 3: Secondary | ICD-10-CM | POA: Diagnosis not present

## 2022-04-26 DIAGNOSIS — I4821 Permanent atrial fibrillation: Secondary | ICD-10-CM | POA: Diagnosis not present

## 2022-04-26 DIAGNOSIS — Z7982 Long term (current) use of aspirin: Secondary | ICD-10-CM | POA: Diagnosis not present

## 2022-04-26 DIAGNOSIS — I110 Hypertensive heart disease with heart failure: Secondary | ICD-10-CM | POA: Diagnosis not present

## 2022-04-26 DIAGNOSIS — N39498 Other specified urinary incontinence: Secondary | ICD-10-CM | POA: Diagnosis not present

## 2022-04-26 DIAGNOSIS — G4733 Obstructive sleep apnea (adult) (pediatric): Secondary | ICD-10-CM | POA: Diagnosis not present

## 2022-04-26 DIAGNOSIS — M47812 Spondylosis without myelopathy or radiculopathy, cervical region: Secondary | ICD-10-CM | POA: Diagnosis not present

## 2022-04-26 DIAGNOSIS — Z955 Presence of coronary angioplasty implant and graft: Secondary | ICD-10-CM | POA: Diagnosis not present

## 2022-04-26 DIAGNOSIS — E1165 Type 2 diabetes mellitus with hyperglycemia: Secondary | ICD-10-CM | POA: Diagnosis not present

## 2022-04-26 DIAGNOSIS — N401 Enlarged prostate with lower urinary tract symptoms: Secondary | ICD-10-CM | POA: Diagnosis not present

## 2022-04-26 DIAGNOSIS — I5031 Acute diastolic (congestive) heart failure: Secondary | ICD-10-CM | POA: Diagnosis not present

## 2022-04-26 DIAGNOSIS — D696 Thrombocytopenia, unspecified: Secondary | ICD-10-CM | POA: Diagnosis not present

## 2022-04-26 DIAGNOSIS — I214 Non-ST elevation (NSTEMI) myocardial infarction: Secondary | ICD-10-CM | POA: Diagnosis not present

## 2022-04-28 ENCOUNTER — Ambulatory Visit (HOSPITAL_COMMUNITY)
Admission: RE | Admit: 2022-04-28 | Discharge: 2022-04-28 | Disposition: A | Discharge status: Home / Self Care | Payer: Medicare Other | Source: Ambulatory Visit | Attending: Internal Medicine | Admitting: Internal Medicine

## 2022-04-28 ENCOUNTER — Encounter (INDEPENDENT_AMBULATORY_CARE_PROVIDER_SITE_OTHER): Payer: Self-pay | Admitting: *Deleted

## 2022-04-28 DIAGNOSIS — E261 Secondary hyperaldosteronism: Secondary | ICD-10-CM | POA: Diagnosis not present

## 2022-04-28 DIAGNOSIS — I110 Hypertensive heart disease with heart failure: Secondary | ICD-10-CM | POA: Diagnosis not present

## 2022-04-28 DIAGNOSIS — N401 Enlarged prostate with lower urinary tract symptoms: Secondary | ICD-10-CM | POA: Diagnosis not present

## 2022-04-28 DIAGNOSIS — L89893 Pressure ulcer of other site, stage 3: Secondary | ICD-10-CM | POA: Diagnosis not present

## 2022-04-28 DIAGNOSIS — N39498 Other specified urinary incontinence: Secondary | ICD-10-CM | POA: Diagnosis not present

## 2022-04-28 DIAGNOSIS — I4821 Permanent atrial fibrillation: Secondary | ICD-10-CM | POA: Diagnosis not present

## 2022-04-28 DIAGNOSIS — Z993 Dependence on wheelchair: Secondary | ICD-10-CM | POA: Diagnosis not present

## 2022-04-28 DIAGNOSIS — M47812 Spondylosis without myelopathy or radiculopathy, cervical region: Secondary | ICD-10-CM | POA: Diagnosis not present

## 2022-04-28 DIAGNOSIS — Z7982 Long term (current) use of aspirin: Secondary | ICD-10-CM | POA: Diagnosis not present

## 2022-04-28 DIAGNOSIS — I5032 Chronic diastolic (congestive) heart failure: Secondary | ICD-10-CM | POA: Diagnosis not present

## 2022-04-28 DIAGNOSIS — Z7984 Long term (current) use of oral hypoglycemic drugs: Secondary | ICD-10-CM | POA: Diagnosis not present

## 2022-04-28 DIAGNOSIS — D696 Thrombocytopenia, unspecified: Secondary | ICD-10-CM | POA: Diagnosis not present

## 2022-04-28 DIAGNOSIS — I5031 Acute diastolic (congestive) heart failure: Secondary | ICD-10-CM | POA: Diagnosis not present

## 2022-04-28 DIAGNOSIS — E1165 Type 2 diabetes mellitus with hyperglycemia: Secondary | ICD-10-CM | POA: Diagnosis not present

## 2022-04-28 DIAGNOSIS — G4733 Obstructive sleep apnea (adult) (pediatric): Secondary | ICD-10-CM | POA: Diagnosis not present

## 2022-04-28 DIAGNOSIS — Z7902 Long term (current) use of antithrombotics/antiplatelets: Secondary | ICD-10-CM | POA: Diagnosis not present

## 2022-04-28 DIAGNOSIS — I214 Non-ST elevation (NSTEMI) myocardial infarction: Secondary | ICD-10-CM | POA: Diagnosis not present

## 2022-04-28 DIAGNOSIS — D6869 Other thrombophilia: Secondary | ICD-10-CM | POA: Diagnosis not present

## 2022-04-28 DIAGNOSIS — Z955 Presence of coronary angioplasty implant and graft: Secondary | ICD-10-CM | POA: Diagnosis not present

## 2022-04-28 LAB — BASIC METABOLIC PANEL
Anion gap: 9 (ref 5–15)
BUN: 25 mg/dL — ABNORMAL HIGH (ref 8–23)
CO2: 27 mmol/L (ref 22–32)
Calcium: 8.5 mg/dL — ABNORMAL LOW (ref 8.9–10.3)
Chloride: 103 mmol/L (ref 98–111)
Creatinine, Ser: 1.52 mg/dL — ABNORMAL HIGH (ref 0.61–1.24)
GFR, Estimated: 48 mL/min — ABNORMAL LOW (ref >60)
Glucose, Bld: 126 mg/dL — ABNORMAL HIGH (ref 70–99)
Potassium: 4.1 mmol/L (ref 3.5–5.1)
Sodium: 139 mmol/L (ref 135–145)

## 2022-05-01 DIAGNOSIS — I110 Hypertensive heart disease with heart failure: Secondary | ICD-10-CM | POA: Diagnosis not present

## 2022-05-01 DIAGNOSIS — N401 Enlarged prostate with lower urinary tract symptoms: Secondary | ICD-10-CM | POA: Diagnosis not present

## 2022-05-01 DIAGNOSIS — I5031 Acute diastolic (congestive) heart failure: Secondary | ICD-10-CM | POA: Diagnosis not present

## 2022-05-01 DIAGNOSIS — I214 Non-ST elevation (NSTEMI) myocardial infarction: Secondary | ICD-10-CM | POA: Diagnosis not present

## 2022-05-01 DIAGNOSIS — Z7982 Long term (current) use of aspirin: Secondary | ICD-10-CM | POA: Diagnosis not present

## 2022-05-01 DIAGNOSIS — I4821 Permanent atrial fibrillation: Secondary | ICD-10-CM | POA: Diagnosis not present

## 2022-05-01 DIAGNOSIS — N39498 Other specified urinary incontinence: Secondary | ICD-10-CM | POA: Diagnosis not present

## 2022-05-01 DIAGNOSIS — D696 Thrombocytopenia, unspecified: Secondary | ICD-10-CM | POA: Diagnosis not present

## 2022-05-01 DIAGNOSIS — Z955 Presence of coronary angioplasty implant and graft: Secondary | ICD-10-CM | POA: Diagnosis not present

## 2022-05-01 DIAGNOSIS — G4733 Obstructive sleep apnea (adult) (pediatric): Secondary | ICD-10-CM | POA: Diagnosis not present

## 2022-05-01 DIAGNOSIS — M47812 Spondylosis without myelopathy or radiculopathy, cervical region: Secondary | ICD-10-CM | POA: Diagnosis not present

## 2022-05-01 DIAGNOSIS — E1165 Type 2 diabetes mellitus with hyperglycemia: Secondary | ICD-10-CM | POA: Diagnosis not present

## 2022-05-01 DIAGNOSIS — Z7902 Long term (current) use of antithrombotics/antiplatelets: Secondary | ICD-10-CM | POA: Diagnosis not present

## 2022-05-01 DIAGNOSIS — Z7984 Long term (current) use of oral hypoglycemic drugs: Secondary | ICD-10-CM | POA: Diagnosis not present

## 2022-05-01 DIAGNOSIS — Z993 Dependence on wheelchair: Secondary | ICD-10-CM | POA: Diagnosis not present

## 2022-05-01 DIAGNOSIS — L89893 Pressure ulcer of other site, stage 3: Secondary | ICD-10-CM | POA: Diagnosis not present

## 2022-05-02 DIAGNOSIS — I110 Hypertensive heart disease with heart failure: Secondary | ICD-10-CM | POA: Diagnosis not present

## 2022-05-02 DIAGNOSIS — E1165 Type 2 diabetes mellitus with hyperglycemia: Secondary | ICD-10-CM | POA: Diagnosis not present

## 2022-05-02 DIAGNOSIS — Z955 Presence of coronary angioplasty implant and graft: Secondary | ICD-10-CM | POA: Diagnosis not present

## 2022-05-02 DIAGNOSIS — D696 Thrombocytopenia, unspecified: Secondary | ICD-10-CM | POA: Diagnosis not present

## 2022-05-02 DIAGNOSIS — N401 Enlarged prostate with lower urinary tract symptoms: Secondary | ICD-10-CM | POA: Diagnosis not present

## 2022-05-02 DIAGNOSIS — L89893 Pressure ulcer of other site, stage 3: Secondary | ICD-10-CM | POA: Diagnosis not present

## 2022-05-02 DIAGNOSIS — G4733 Obstructive sleep apnea (adult) (pediatric): Secondary | ICD-10-CM | POA: Diagnosis not present

## 2022-05-02 DIAGNOSIS — N39498 Other specified urinary incontinence: Secondary | ICD-10-CM | POA: Diagnosis not present

## 2022-05-02 DIAGNOSIS — I214 Non-ST elevation (NSTEMI) myocardial infarction: Secondary | ICD-10-CM | POA: Diagnosis not present

## 2022-05-02 DIAGNOSIS — Z7982 Long term (current) use of aspirin: Secondary | ICD-10-CM | POA: Diagnosis not present

## 2022-05-02 DIAGNOSIS — Z993 Dependence on wheelchair: Secondary | ICD-10-CM | POA: Diagnosis not present

## 2022-05-02 DIAGNOSIS — Z7902 Long term (current) use of antithrombotics/antiplatelets: Secondary | ICD-10-CM | POA: Diagnosis not present

## 2022-05-02 DIAGNOSIS — M47812 Spondylosis without myelopathy or radiculopathy, cervical region: Secondary | ICD-10-CM | POA: Diagnosis not present

## 2022-05-02 DIAGNOSIS — I5031 Acute diastolic (congestive) heart failure: Secondary | ICD-10-CM | POA: Diagnosis not present

## 2022-05-02 DIAGNOSIS — I4821 Permanent atrial fibrillation: Secondary | ICD-10-CM | POA: Diagnosis not present

## 2022-05-02 DIAGNOSIS — Z7984 Long term (current) use of oral hypoglycemic drugs: Secondary | ICD-10-CM | POA: Diagnosis not present

## 2022-05-04 DIAGNOSIS — M47812 Spondylosis without myelopathy or radiculopathy, cervical region: Secondary | ICD-10-CM | POA: Diagnosis not present

## 2022-05-04 DIAGNOSIS — Z7982 Long term (current) use of aspirin: Secondary | ICD-10-CM | POA: Diagnosis not present

## 2022-05-04 DIAGNOSIS — I110 Hypertensive heart disease with heart failure: Secondary | ICD-10-CM | POA: Diagnosis not present

## 2022-05-04 DIAGNOSIS — I4821 Permanent atrial fibrillation: Secondary | ICD-10-CM | POA: Diagnosis not present

## 2022-05-04 DIAGNOSIS — Z7984 Long term (current) use of oral hypoglycemic drugs: Secondary | ICD-10-CM | POA: Diagnosis not present

## 2022-05-04 DIAGNOSIS — G4733 Obstructive sleep apnea (adult) (pediatric): Secondary | ICD-10-CM | POA: Diagnosis not present

## 2022-05-04 DIAGNOSIS — Z7902 Long term (current) use of antithrombotics/antiplatelets: Secondary | ICD-10-CM | POA: Diagnosis not present

## 2022-05-04 DIAGNOSIS — E1165 Type 2 diabetes mellitus with hyperglycemia: Secondary | ICD-10-CM | POA: Diagnosis not present

## 2022-05-04 DIAGNOSIS — N401 Enlarged prostate with lower urinary tract symptoms: Secondary | ICD-10-CM | POA: Diagnosis not present

## 2022-05-04 DIAGNOSIS — Z993 Dependence on wheelchair: Secondary | ICD-10-CM | POA: Diagnosis not present

## 2022-05-04 DIAGNOSIS — D696 Thrombocytopenia, unspecified: Secondary | ICD-10-CM | POA: Diagnosis not present

## 2022-05-04 DIAGNOSIS — N39498 Other specified urinary incontinence: Secondary | ICD-10-CM | POA: Diagnosis not present

## 2022-05-04 DIAGNOSIS — I5031 Acute diastolic (congestive) heart failure: Secondary | ICD-10-CM | POA: Diagnosis not present

## 2022-05-04 DIAGNOSIS — I214 Non-ST elevation (NSTEMI) myocardial infarction: Secondary | ICD-10-CM | POA: Diagnosis not present

## 2022-05-04 DIAGNOSIS — L89893 Pressure ulcer of other site, stage 3: Secondary | ICD-10-CM | POA: Diagnosis not present

## 2022-05-04 DIAGNOSIS — Z955 Presence of coronary angioplasty implant and graft: Secondary | ICD-10-CM | POA: Diagnosis not present

## 2022-05-07 DIAGNOSIS — Z7982 Long term (current) use of aspirin: Secondary | ICD-10-CM | POA: Diagnosis not present

## 2022-05-07 DIAGNOSIS — N39498 Other specified urinary incontinence: Secondary | ICD-10-CM | POA: Diagnosis not present

## 2022-05-07 DIAGNOSIS — Z955 Presence of coronary angioplasty implant and graft: Secondary | ICD-10-CM | POA: Diagnosis not present

## 2022-05-07 DIAGNOSIS — Z7984 Long term (current) use of oral hypoglycemic drugs: Secondary | ICD-10-CM | POA: Diagnosis not present

## 2022-05-07 DIAGNOSIS — Z7902 Long term (current) use of antithrombotics/antiplatelets: Secondary | ICD-10-CM | POA: Diagnosis not present

## 2022-05-07 DIAGNOSIS — Z993 Dependence on wheelchair: Secondary | ICD-10-CM | POA: Diagnosis not present

## 2022-05-07 DIAGNOSIS — L89893 Pressure ulcer of other site, stage 3: Secondary | ICD-10-CM | POA: Diagnosis not present

## 2022-05-07 DIAGNOSIS — E1165 Type 2 diabetes mellitus with hyperglycemia: Secondary | ICD-10-CM | POA: Diagnosis not present

## 2022-05-07 DIAGNOSIS — N401 Enlarged prostate with lower urinary tract symptoms: Secondary | ICD-10-CM | POA: Diagnosis not present

## 2022-05-07 DIAGNOSIS — D696 Thrombocytopenia, unspecified: Secondary | ICD-10-CM | POA: Diagnosis not present

## 2022-05-07 DIAGNOSIS — Z9181 History of falling: Secondary | ICD-10-CM | POA: Diagnosis not present

## 2022-05-07 DIAGNOSIS — I110 Hypertensive heart disease with heart failure: Secondary | ICD-10-CM | POA: Diagnosis not present

## 2022-05-07 DIAGNOSIS — G4733 Obstructive sleep apnea (adult) (pediatric): Secondary | ICD-10-CM | POA: Diagnosis not present

## 2022-05-07 DIAGNOSIS — M47812 Spondylosis without myelopathy or radiculopathy, cervical region: Secondary | ICD-10-CM | POA: Diagnosis not present

## 2022-05-07 DIAGNOSIS — I4821 Permanent atrial fibrillation: Secondary | ICD-10-CM | POA: Diagnosis not present

## 2022-05-07 DIAGNOSIS — I214 Non-ST elevation (NSTEMI) myocardial infarction: Secondary | ICD-10-CM | POA: Diagnosis not present

## 2022-05-07 DIAGNOSIS — I5031 Acute diastolic (congestive) heart failure: Secondary | ICD-10-CM | POA: Diagnosis not present

## 2022-05-09 ENCOUNTER — Encounter: Payer: Self-pay | Admitting: Podiatry

## 2022-05-09 ENCOUNTER — Ambulatory Visit: Payer: Medicare Other | Admitting: Podiatry

## 2022-05-09 DIAGNOSIS — I110 Hypertensive heart disease with heart failure: Secondary | ICD-10-CM | POA: Diagnosis not present

## 2022-05-09 DIAGNOSIS — D2371 Other benign neoplasm of skin of right lower limb, including hip: Secondary | ICD-10-CM

## 2022-05-09 DIAGNOSIS — Z7982 Long term (current) use of aspirin: Secondary | ICD-10-CM | POA: Diagnosis not present

## 2022-05-09 DIAGNOSIS — G4733 Obstructive sleep apnea (adult) (pediatric): Secondary | ICD-10-CM | POA: Diagnosis not present

## 2022-05-09 DIAGNOSIS — N401 Enlarged prostate with lower urinary tract symptoms: Secondary | ICD-10-CM | POA: Diagnosis not present

## 2022-05-09 DIAGNOSIS — L600 Ingrowing nail: Secondary | ICD-10-CM | POA: Diagnosis not present

## 2022-05-09 DIAGNOSIS — D689 Coagulation defect, unspecified: Secondary | ICD-10-CM

## 2022-05-09 DIAGNOSIS — D6869 Other thrombophilia: Secondary | ICD-10-CM | POA: Insufficient documentation

## 2022-05-09 DIAGNOSIS — M79676 Pain in unspecified toe(s): Secondary | ICD-10-CM

## 2022-05-09 DIAGNOSIS — E261 Secondary hyperaldosteronism: Secondary | ICD-10-CM | POA: Insufficient documentation

## 2022-05-09 DIAGNOSIS — Z7984 Long term (current) use of oral hypoglycemic drugs: Secondary | ICD-10-CM | POA: Diagnosis not present

## 2022-05-09 DIAGNOSIS — I214 Non-ST elevation (NSTEMI) myocardial infarction: Secondary | ICD-10-CM | POA: Diagnosis not present

## 2022-05-09 DIAGNOSIS — B351 Tinea unguium: Secondary | ICD-10-CM | POA: Diagnosis not present

## 2022-05-09 DIAGNOSIS — L89893 Pressure ulcer of other site, stage 3: Secondary | ICD-10-CM | POA: Diagnosis not present

## 2022-05-09 DIAGNOSIS — D696 Thrombocytopenia, unspecified: Secondary | ICD-10-CM | POA: Diagnosis not present

## 2022-05-09 DIAGNOSIS — Z955 Presence of coronary angioplasty implant and graft: Secondary | ICD-10-CM | POA: Diagnosis not present

## 2022-05-09 DIAGNOSIS — Z7902 Long term (current) use of antithrombotics/antiplatelets: Secondary | ICD-10-CM | POA: Diagnosis not present

## 2022-05-09 DIAGNOSIS — Z993 Dependence on wheelchair: Secondary | ICD-10-CM | POA: Diagnosis not present

## 2022-05-09 DIAGNOSIS — N39498 Other specified urinary incontinence: Secondary | ICD-10-CM | POA: Diagnosis not present

## 2022-05-09 DIAGNOSIS — I4821 Permanent atrial fibrillation: Secondary | ICD-10-CM | POA: Diagnosis not present

## 2022-05-09 DIAGNOSIS — D2372 Other benign neoplasm of skin of left lower limb, including hip: Secondary | ICD-10-CM

## 2022-05-09 DIAGNOSIS — M47812 Spondylosis without myelopathy or radiculopathy, cervical region: Secondary | ICD-10-CM | POA: Diagnosis not present

## 2022-05-09 DIAGNOSIS — I5031 Acute diastolic (congestive) heart failure: Secondary | ICD-10-CM | POA: Diagnosis not present

## 2022-05-09 DIAGNOSIS — E1165 Type 2 diabetes mellitus with hyperglycemia: Secondary | ICD-10-CM | POA: Diagnosis not present

## 2022-05-09 MED ORDER — NEOMYCIN-POLYMYXIN-HC 1 % OT SOLN: OTIC | 1 refills | Status: DC

## 2022-05-09 NOTE — Progress Notes (Signed)
Subjective:  Patient ID: Arthur Wilson, male    DOB: 1945-05-12,  MRN: [id number] HPI Chief Complaint  Patient presents with   Toe Pain    Hallux right - lateral border, ingrown x months, bleeds sometimes, some redness   Diabetes    Last A1c was 7.4, recovering from heart attack-Jan 2nd   New Patient (Initial Visit)    77 y.o. male presents with the above complaint.   ROS: Denies fever chills nausea vomit muscle aches pains calf pain back pain chest pain shortness of breath.  Has recently had a heart attack with catheterization and stenting.  This is left him with some edema bilateral lower extremities and weakness.  Past Medical History:  Diagnosis Date   Atrial fibrillation (HCC)    BPH (benign prostatic hyperplasia)    Cervical spondylosis without myelopathy    Essential hypertension    Impaired glucose tolerance    OSA on CPAP    Testicular hypofunction    Past Surgical History:  Procedure Laterality Date   CORONARY ATHERECTOMY N/A 03/31/2022   Procedure: CORONARY ATHERECTOMY;  Surgeon: Early Osmond, MD;  Location: [facility];  Service: Cardiovascular;  Laterality: N/A;   CORONARY STENT INTERVENTION N/A 03/30/2022   Procedure: CORONARY STENT INTERVENTION;  Surgeon: Early Osmond, MD;  Location: [facility];  Service: Cardiovascular;  Laterality: N/A;   CORONARY STENT INTERVENTION N/A 03/31/2022   Procedure: CORONARY STENT INTERVENTION;  Surgeon: Early Osmond, MD;  Location: [facility];  Service: Cardiovascular;  Laterality: N/A;   FOOT SURGERY  01/10/2016   Bunion removal   INTRAVASCULAR PRESSURE WIRE/FFR STUDY N/A 03/30/2022   Procedure: INTRAVASCULAR PRESSURE WIRE/FFR STUDY;  Surgeon: Early Osmond, MD;  Location: [facility];  Service: Cardiovascular;  Laterality: N/A;   KNEE SURGERY  1992   removal of ACL from ski fall   RIGHT/LEFT HEART CATH AND CORONARY ANGIOGRAPHY N/A 03/30/2022   Procedure: RIGHT/LEFT HEART CATH AND CORONARY  ANGIOGRAPHY;  Surgeon: Early Osmond, MD;  Location: [facility];  Service: Cardiovascular;  Laterality: N/A;    Current Outpatient Medications:    NEOMYCIN-POLYMYXIN-HYDROCORTISONE (CORTISPORIN) 1 % SOLN OTIC solution, Apply 1-2 drops to toe BID after soaking, Disp: 10 mL, Rfl: 1   acetaminophen (TYLENOL) 500 MG tablet, Take 1,000 mg by mouth every 6 (six) hours as needed for moderate pain., Disp: , Rfl:    ALPRAZolam (XANAX) 0.5 MG tablet, Take 0.5 tablets by mouth at bedtime as needed for anxiety or sleep., Disp: , Rfl:    atorvastatin (LIPITOR) 40 MG tablet, Take 1 tablet (40 mg total) by mouth daily., Disp: 30 tablet, Rfl: 0   clopidogrel (PLAVIX) 75 MG tablet, Take 1 tablet (75 mg total) by mouth daily with breakfast., Disp: 30 tablet, Rfl: 1   Continuous Blood Gluc Sensor (DEXCOM G7 SENSOR) MISC, for 30 Days, Disp: , Rfl:    ELIQUIS 5 MG TABS tablet, Take 5 mg by mouth 2 (two) times daily., Disp: , Rfl:    empagliflozin (JARDIANCE) 10 MG TABS tablet, Take 1 tablet (10 mg total) by mouth daily., Disp: 30 tablet, Rfl: 0   fexofenadine (ALLEGRA) 180 MG tablet, Take 180 mg by mouth as needed for allergies or rhinitis., Disp: , Rfl:    fluticasone (FLONASE) 50 MCG/ACT nasal spray, Place 1 spray into both nostrils as needed for allergies., Disp: , Rfl:    furosemide (LASIX) 40 MG tablet, Take 1 tablet (40 mg total)  by mouth daily as needed for fluid or edema., Disp: 30 tablet, Rfl: 2   HYDROcodone-acetaminophen (NORCO) 7.5-325 MG tablet, Take 0.5-1 tablets by mouth 2 (two) times daily as needed for moderate pain., Disp: , Rfl:    metFORMIN (GLUCOPHAGE) 500 MG tablet, Take 500 mg by mouth 2 (two) times daily with a meal., Disp: , Rfl:    metoprolol succinate (TOPROL-XL) 100 MG 24 hr tablet, Take 1 tablet (100 mg total) by mouth 2 (two) times daily. Take with or immediately following a meal., Disp: 60 tablet, Rfl: 0   pantoprazole (PROTONIX) 40 MG tablet, Take 1 tablet (40 mg total) by  mouth daily at 12 noon., Disp: 30 tablet, Rfl: 0   polyethylene glycol powder (GLYCOLAX/MIRALAX) 17 GM/SCOOP powder, Take 1 capful (17 g) by mouth daily as needed for mild constipation., Disp: 238 g, Rfl: 0  No Known Allergies Review of Systems Objective:  There were no vitals filed for this visit.  General: Well developed, nourished, in no acute distress, alert and oriented x3   Dermatological: Skin is warm, dry and supple bilateral. Nails x 10 are thick yellow dystrophic and clinically mycotic; remaining integument appears unremarkable at this time. There are no open sores, no preulcerative lesions, no rash or signs of infection present.  Sharply abraded toenail fibular border the hallux right with erythema no purulence no malodor.  He does have multiple callosities benign skin lesions plantar aspect of bilateral foot and medial aspect of the second digit of the left foot.  Noncomplicated no open lesions or wounds are noted.  Vascular: Dorsalis Pedis artery and Posterior Tibial artery pedal pulses are 2/4 bilateral with immedate capillary fill time. Pedal hair growth present. No varicosities and no lower extremity edema present bilateral.   Neruologic: Grossly intact via light touch bilateral. Vibratory intact via tuning fork bilateral. Protective threshold with Semmes Wienstein monofilament intact to all pedal sites bilateral. Patellar and Achilles deep tendon reflexes 2+ bilateral. No Babinski or clonus noted bilateral.   Musculoskeletal: No gross boney pedal deformities bilateral. No pain, crepitus, or limitation noted with foot and ankle range of motion bilateral. Muscular strength 5/5 in all groups tested bilateral.  Hammertoe deformities bilateral.  Thank you  Gait: Unassisted, Nonantalgic.    Radiographs:  None taken  Assessment & Plan:   Assessment: Ingrown toenail fibular border hallux right.  Multiple callosities and nail dystrophy is bilateral.  Plan: Chemical matricectomy  was performed today hallux right fibular border tolerated procedure well after local anesthetic was administered.  He was given both oral written home-going instructions for care and soaking the toe as well as a prescription for Cortisporin Otic to be applied twice daily after soaking.  All callosities and benign skin lesions were trimmed and submitted today.  No iatrogenic lesions were noted.  Debrided nails 1 through 5 bilaterally.  We will get him set up with Dr. Adah Perl for routine debridement.     Donathan Buller T. [place], [place]

## 2022-05-09 NOTE — Patient Instructions (Signed)

## 2022-05-11 DIAGNOSIS — Z7982 Long term (current) use of aspirin: Secondary | ICD-10-CM | POA: Diagnosis not present

## 2022-05-11 DIAGNOSIS — L89893 Pressure ulcer of other site, stage 3: Secondary | ICD-10-CM | POA: Diagnosis not present

## 2022-05-11 DIAGNOSIS — N401 Enlarged prostate with lower urinary tract symptoms: Secondary | ICD-10-CM | POA: Diagnosis not present

## 2022-05-11 DIAGNOSIS — N39498 Other specified urinary incontinence: Secondary | ICD-10-CM | POA: Diagnosis not present

## 2022-05-11 DIAGNOSIS — I5031 Acute diastolic (congestive) heart failure: Secondary | ICD-10-CM | POA: Diagnosis not present

## 2022-05-11 DIAGNOSIS — M47812 Spondylosis without myelopathy or radiculopathy, cervical region: Secondary | ICD-10-CM | POA: Diagnosis not present

## 2022-05-11 DIAGNOSIS — Z993 Dependence on wheelchair: Secondary | ICD-10-CM | POA: Diagnosis not present

## 2022-05-11 DIAGNOSIS — Z7984 Long term (current) use of oral hypoglycemic drugs: Secondary | ICD-10-CM | POA: Diagnosis not present

## 2022-05-11 DIAGNOSIS — Z7902 Long term (current) use of antithrombotics/antiplatelets: Secondary | ICD-10-CM | POA: Diagnosis not present

## 2022-05-11 DIAGNOSIS — I110 Hypertensive heart disease with heart failure: Secondary | ICD-10-CM | POA: Diagnosis not present

## 2022-05-11 DIAGNOSIS — G4733 Obstructive sleep apnea (adult) (pediatric): Secondary | ICD-10-CM | POA: Diagnosis not present

## 2022-05-11 DIAGNOSIS — E1165 Type 2 diabetes mellitus with hyperglycemia: Secondary | ICD-10-CM | POA: Diagnosis not present

## 2022-05-11 DIAGNOSIS — D696 Thrombocytopenia, unspecified: Secondary | ICD-10-CM | POA: Diagnosis not present

## 2022-05-11 DIAGNOSIS — Z955 Presence of coronary angioplasty implant and graft: Secondary | ICD-10-CM | POA: Diagnosis not present

## 2022-05-11 DIAGNOSIS — I214 Non-ST elevation (NSTEMI) myocardial infarction: Secondary | ICD-10-CM | POA: Diagnosis not present

## 2022-05-11 DIAGNOSIS — I4821 Permanent atrial fibrillation: Secondary | ICD-10-CM | POA: Diagnosis not present

## 2022-05-18 ENCOUNTER — Other Ambulatory Visit (HOSPITAL_COMMUNITY): Payer: Self-pay

## 2022-05-18 ENCOUNTER — Telehealth (HOSPITAL_COMMUNITY): Payer: Self-pay

## 2022-05-18 DIAGNOSIS — I5032 Chronic diastolic (congestive) heart failure: Secondary | ICD-10-CM

## 2022-05-18 NOTE — Telephone Encounter (Signed)
Patient called with concerns of his BP dropping. His normal BP is 115/75 range and his BP this morning was 98/67. He did not take his 2nd dose of Metoprolol last night. He denied any symptoms associated with low BP. His routine has not changed, he is eating and drinking as he usually does. I spoke to [place] and she recommended he come in for labs to see where he is at and she will make the determination about medication changes at that time.  I informed Arthur Wilson of the plan and he agreed. Labs scheduled for tomorrow and ordered.

## 2022-05-19 ENCOUNTER — Ambulatory Visit (HOSPITAL_COMMUNITY)
Admission: RE | Admit: 2022-05-19 | Discharge: 2022-05-19 | Disposition: A | Discharge status: Home / Self Care | Payer: Medicare Other | Source: Ambulatory Visit | Attending: Internal Medicine | Admitting: Internal Medicine

## 2022-05-19 ENCOUNTER — Telehealth (HOSPITAL_COMMUNITY): Payer: Self-pay

## 2022-05-19 DIAGNOSIS — I5032 Chronic diastolic (congestive) heart failure: Secondary | ICD-10-CM | POA: Diagnosis not present

## 2022-05-19 DIAGNOSIS — M25561 Pain in right knee: Secondary | ICD-10-CM | POA: Diagnosis not present

## 2022-05-19 DIAGNOSIS — I509 Heart failure, unspecified: Secondary | ICD-10-CM | POA: Diagnosis not present

## 2022-05-19 DIAGNOSIS — M25562 Pain in left knee: Secondary | ICD-10-CM | POA: Diagnosis not present

## 2022-05-19 DIAGNOSIS — I2510 Atherosclerotic heart disease of native coronary artery without angina pectoris: Secondary | ICD-10-CM | POA: Diagnosis not present

## 2022-05-19 LAB — CBC
HCT: 36.6 % — ABNORMAL LOW (ref 39.0–52.0)
Hemoglobin: 11.7 g/dL — ABNORMAL LOW (ref 13.0–17.0)
MCH: 30.6 pg (ref 26.0–34.0)
MCHC: 32 g/dL (ref 30.0–36.0)
MCV: 95.8 fL (ref 80.0–100.0)
Platelets: 233 10*3/uL (ref 150–400)
RBC: 3.82 MIL/uL — ABNORMAL LOW (ref 4.22–5.81)
RDW: 15.7 % — ABNORMAL HIGH (ref 11.5–15.5)
WBC: 8.2 10*3/uL (ref 4.0–10.5)
nRBC: 0 % (ref 0.0–0.2)

## 2022-05-19 LAB — BASIC METABOLIC PANEL
Anion gap: 10 (ref 5–15)
BUN: 24 mg/dL — ABNORMAL HIGH (ref 8–23)
CO2: 26 mmol/L (ref 22–32)
Calcium: 8.5 mg/dL — ABNORMAL LOW (ref 8.9–10.3)
Chloride: 104 mmol/L (ref 98–111)
Creatinine, Ser: 1.59 mg/dL — ABNORMAL HIGH (ref 0.61–1.24)
GFR, Estimated: 45 mL/min — ABNORMAL LOW (ref >60)
Glucose, Bld: 100 mg/dL — ABNORMAL HIGH (ref 70–99)
Potassium: 4.3 mmol/L (ref 3.5–5.1)
Sodium: 140 mmol/L (ref 135–145)

## 2022-05-19 LAB — BRAIN NATRIURETIC PEPTIDE: B Natriuretic Peptide: 783.5 pg/mL — ABNORMAL HIGH (ref 0.0–100.0)

## 2022-05-19 NOTE — Telephone Encounter (Signed)
Mr. Arthur Wilson came in for his labs and I checked his BP while here.  The reading was 100/70 sitting.

## 2022-05-23 ENCOUNTER — Ambulatory Visit: Payer: Medicare Other | Admitting: Podiatry

## 2022-05-23 DIAGNOSIS — L600 Ingrowing nail: Secondary | ICD-10-CM

## 2022-05-23 DIAGNOSIS — Z9889 Other specified postprocedural states: Secondary | ICD-10-CM

## 2022-05-23 NOTE — Progress Notes (Signed)
He presents today for follow-up of his matrixectomy fibular border hallux right.  Objective: Vital signs stable alert oriented x 3 there is no erythema edema salines drainage or odor.  Peers to be healing very nicely.  Assessment: Well-healing surgical toe.  Plan: Follow-up with me as needed

## 2022-05-24 DIAGNOSIS — I5031 Acute diastolic (congestive) heart failure: Secondary | ICD-10-CM | POA: Diagnosis not present

## 2022-05-24 DIAGNOSIS — D696 Thrombocytopenia, unspecified: Secondary | ICD-10-CM | POA: Diagnosis not present

## 2022-05-24 DIAGNOSIS — I214 Non-ST elevation (NSTEMI) myocardial infarction: Secondary | ICD-10-CM | POA: Diagnosis not present

## 2022-05-24 DIAGNOSIS — N39498 Other specified urinary incontinence: Secondary | ICD-10-CM | POA: Diagnosis not present

## 2022-05-24 DIAGNOSIS — Z7984 Long term (current) use of oral hypoglycemic drugs: Secondary | ICD-10-CM | POA: Diagnosis not present

## 2022-05-24 DIAGNOSIS — Z7982 Long term (current) use of aspirin: Secondary | ICD-10-CM | POA: Diagnosis not present

## 2022-05-24 DIAGNOSIS — L89893 Pressure ulcer of other site, stage 3: Secondary | ICD-10-CM | POA: Diagnosis not present

## 2022-05-24 DIAGNOSIS — N401 Enlarged prostate with lower urinary tract symptoms: Secondary | ICD-10-CM | POA: Diagnosis not present

## 2022-05-24 DIAGNOSIS — Z993 Dependence on wheelchair: Secondary | ICD-10-CM | POA: Diagnosis not present

## 2022-05-24 DIAGNOSIS — G4733 Obstructive sleep apnea (adult) (pediatric): Secondary | ICD-10-CM | POA: Diagnosis not present

## 2022-05-24 DIAGNOSIS — M47812 Spondylosis without myelopathy or radiculopathy, cervical region: Secondary | ICD-10-CM | POA: Diagnosis not present

## 2022-05-24 DIAGNOSIS — Z955 Presence of coronary angioplasty implant and graft: Secondary | ICD-10-CM | POA: Diagnosis not present

## 2022-05-24 DIAGNOSIS — E1165 Type 2 diabetes mellitus with hyperglycemia: Secondary | ICD-10-CM | POA: Diagnosis not present

## 2022-05-24 DIAGNOSIS — Z7902 Long term (current) use of antithrombotics/antiplatelets: Secondary | ICD-10-CM | POA: Diagnosis not present

## 2022-05-24 DIAGNOSIS — I110 Hypertensive heart disease with heart failure: Secondary | ICD-10-CM | POA: Diagnosis not present

## 2022-05-24 DIAGNOSIS — I4821 Permanent atrial fibrillation: Secondary | ICD-10-CM | POA: Diagnosis not present

## 2022-05-30 DIAGNOSIS — D696 Thrombocytopenia, unspecified: Secondary | ICD-10-CM | POA: Diagnosis not present

## 2022-05-30 DIAGNOSIS — E1165 Type 2 diabetes mellitus with hyperglycemia: Secondary | ICD-10-CM | POA: Diagnosis not present

## 2022-05-30 DIAGNOSIS — I5031 Acute diastolic (congestive) heart failure: Secondary | ICD-10-CM | POA: Diagnosis not present

## 2022-05-30 DIAGNOSIS — L89893 Pressure ulcer of other site, stage 3: Secondary | ICD-10-CM | POA: Diagnosis not present

## 2022-05-30 DIAGNOSIS — Z993 Dependence on wheelchair: Secondary | ICD-10-CM | POA: Diagnosis not present

## 2022-05-30 DIAGNOSIS — I4821 Permanent atrial fibrillation: Secondary | ICD-10-CM | POA: Diagnosis not present

## 2022-05-30 DIAGNOSIS — Z7902 Long term (current) use of antithrombotics/antiplatelets: Secondary | ICD-10-CM | POA: Diagnosis not present

## 2022-05-30 DIAGNOSIS — Z7982 Long term (current) use of aspirin: Secondary | ICD-10-CM | POA: Diagnosis not present

## 2022-05-30 DIAGNOSIS — N39498 Other specified urinary incontinence: Secondary | ICD-10-CM | POA: Diagnosis not present

## 2022-05-30 DIAGNOSIS — M47812 Spondylosis without myelopathy or radiculopathy, cervical region: Secondary | ICD-10-CM | POA: Diagnosis not present

## 2022-05-30 DIAGNOSIS — N401 Enlarged prostate with lower urinary tract symptoms: Secondary | ICD-10-CM | POA: Diagnosis not present

## 2022-05-30 DIAGNOSIS — I214 Non-ST elevation (NSTEMI) myocardial infarction: Secondary | ICD-10-CM | POA: Diagnosis not present

## 2022-05-30 DIAGNOSIS — Z955 Presence of coronary angioplasty implant and graft: Secondary | ICD-10-CM | POA: Diagnosis not present

## 2022-05-30 DIAGNOSIS — G4733 Obstructive sleep apnea (adult) (pediatric): Secondary | ICD-10-CM | POA: Diagnosis not present

## 2022-05-30 DIAGNOSIS — Z7984 Long term (current) use of oral hypoglycemic drugs: Secondary | ICD-10-CM | POA: Diagnosis not present

## 2022-05-30 DIAGNOSIS — I110 Hypertensive heart disease with heart failure: Secondary | ICD-10-CM | POA: Diagnosis not present

## 2022-06-01 DIAGNOSIS — Z7902 Long term (current) use of antithrombotics/antiplatelets: Secondary | ICD-10-CM | POA: Diagnosis not present

## 2022-06-01 DIAGNOSIS — G4733 Obstructive sleep apnea (adult) (pediatric): Secondary | ICD-10-CM | POA: Diagnosis not present

## 2022-06-01 DIAGNOSIS — M47812 Spondylosis without myelopathy or radiculopathy, cervical region: Secondary | ICD-10-CM | POA: Diagnosis not present

## 2022-06-01 DIAGNOSIS — I110 Hypertensive heart disease with heart failure: Secondary | ICD-10-CM | POA: Diagnosis not present

## 2022-06-01 DIAGNOSIS — Z993 Dependence on wheelchair: Secondary | ICD-10-CM | POA: Diagnosis not present

## 2022-06-01 DIAGNOSIS — Z7984 Long term (current) use of oral hypoglycemic drugs: Secondary | ICD-10-CM | POA: Diagnosis not present

## 2022-06-01 DIAGNOSIS — N39498 Other specified urinary incontinence: Secondary | ICD-10-CM | POA: Diagnosis not present

## 2022-06-01 DIAGNOSIS — Z955 Presence of coronary angioplasty implant and graft: Secondary | ICD-10-CM | POA: Diagnosis not present

## 2022-06-01 DIAGNOSIS — I5031 Acute diastolic (congestive) heart failure: Secondary | ICD-10-CM | POA: Diagnosis not present

## 2022-06-01 DIAGNOSIS — I214 Non-ST elevation (NSTEMI) myocardial infarction: Secondary | ICD-10-CM | POA: Diagnosis not present

## 2022-06-01 DIAGNOSIS — E1165 Type 2 diabetes mellitus with hyperglycemia: Secondary | ICD-10-CM | POA: Diagnosis not present

## 2022-06-01 DIAGNOSIS — L89893 Pressure ulcer of other site, stage 3: Secondary | ICD-10-CM | POA: Diagnosis not present

## 2022-06-01 DIAGNOSIS — I4821 Permanent atrial fibrillation: Secondary | ICD-10-CM | POA: Diagnosis not present

## 2022-06-01 DIAGNOSIS — N401 Enlarged prostate with lower urinary tract symptoms: Secondary | ICD-10-CM | POA: Diagnosis not present

## 2022-06-01 DIAGNOSIS — Z7982 Long term (current) use of aspirin: Secondary | ICD-10-CM | POA: Diagnosis not present

## 2022-06-01 DIAGNOSIS — D696 Thrombocytopenia, unspecified: Secondary | ICD-10-CM | POA: Diagnosis not present

## 2022-06-07 NOTE — Progress Notes (Unsigned)
Cardiology Office Note  Date: 06/08/2022   ID: LASHAWN Wilson, DOB [birth year], MRN [id number]  History of Present Illness: Arthur Wilson is a 77 y.o. male last seen in the heart failure clinic back in January, I reviewed the note.  He is here today with his wife for a follow-up visit.  We discussed his hospital stay and cardiac testing from earlier in the year.  He has been taking his medications regularly, using Lasix on average once a week.  He weighs himself daily, also observant of changes in his leg edema.  He has gradually gained weight overall and leg swelling persists but despite using Lasix 48 hours ago.  Still running low normal blood pressure which limits GDMT to some degree.  He does not report any spontaneous bleeding problems on Plavix and Eliquis which we will continue for 69-monthtotal course.  At that point we will go to Eliquis alone.  His baseline lipids looked fairly well-controlled on less potent statin therapy last year with LDL 66, he has been on Lipitor 40 mg daily since January.  He does not report any palpitations on high-dose Toprol-XL.  Physical Exam: VS:  BP (!) 106/58   Pulse 76   Ht '5\' 10"'$  (1.778 m)   Wt 227 lb (103 kg)   SpO2 95%   BMI 32.57 kg/m , BMI Body mass index is 32.57 kg/m.  Wt Readings from Last 3 Encounters:  06/08/22 227 lb (103 kg)  04/21/22 221 lb 9.6 oz (100.5 kg)  04/04/22 227 lb 4.7 oz (103.1 kg)    General: Patient appears comfortable at rest. HEENT: Conjunctiva and lids normal. Neck: Supple, no elevated JVP or carotid bruits. Lungs: Clear to auscultation, nonlabored breathing at rest. Cardiac: Irregular irregular without gallop. Extremities: Compression socks in place, 2+ pitting edema.  ECG:  An ECG dated 04/21/2022 was personally reviewed today and demonstrated:  Atrial fibrillation with leftward axis and LVH, decreased R wave progression.  Labwork: 03/31/2022: ALT 75; AST 33 04/02/2022: Magnesium 1.8 05/19/2022: B  Natriuretic Peptide 783.5; BUN 24; Creatinine, Ser 1.59; Hemoglobin 11.7; Platelets 233; Potassium 4.3; Sodium 140   Other Studies Reviewed Today:  Echocardiogram 03/29/2022:  1. Left ventricular ejection fraction, by estimation, is 50 to 55%. The  left ventricle has low normal function. The left ventricle demonstrates  regional wall motion abnormalities - cannot exclude mild inferior mid to  basal hypokinesis. There is mild  left ventricular hypertrophy. Left ventricular diastolic parameters are  indeterminate.   2. Right ventricular systolic function is mildly reduced. The right  ventricular size is normal. There is normal pulmonary artery systolic  pressure. The estimated right ventricular systolic pressure is [id number].   3. Left atrial size was moderately dilated.   4. Right atrial size was mildly dilated.   5. The mitral valve is grossly normal. Mild to moderate mitral valve  regurgitation.   6. The aortic valve is tricuspid. There is mild calcification of the  aortic valve. Aortic valve regurgitation is not visualized.   7. The inferior vena cava is normal in size with greater than 50%  respiratory variability, suggesting right atrial pressure of 3 mmHg.   Assessment and Plan:  1.  HFpEF, LVEF low normal range at 50 to 55% with regional wall motion abnormality suggesting ischemic cardiomyopathy as of echocardiogram in January of this year.  Mildly reduced RV contraction as well.  His weight is crept up a few pounds and he does have evidence of  fluid overload in terms of lower extremity edema.  At this point we will increase Lasix to 40 mg twice a week, may need further up titration but ideally I would like to get him on low-dose Aldactone eventually if possible.  We will bring him back in 4 weeks with BMET and proceed from there.  2.  CAD status post DES x 2 to the RCA with staged DES to the LAD and medical management of residual obtuse marginal disease as of January.  Plan is aspirin  and Eliquis for total of 6 months and then Eliquis monotherapy thereafter.  He does not report any angina at this time.  No reported spontaneous bleeding problems.  3.  Permanent atrial fibrillation with CHA2DS2-VASc score of 4.  Heart rate control is adequate on current dose of Toprol-XL.  Continue Eliquis long-term.  4.  CKD stage IIIb, last creatinine 1.59.  Prior creatinine in early January had looked better.  Likely still moving target, we will recheck BMET at next visit.  Disposition:  Follow up  4 weeks.  Signed, Satira Sark, M.D., F.A.C.C.

## 2022-06-08 ENCOUNTER — Ambulatory Visit: Payer: Medicare Other | Attending: Cardiology | Admitting: Cardiology

## 2022-06-08 ENCOUNTER — Encounter: Payer: Self-pay | Admitting: Cardiology

## 2022-06-08 VITALS — BP 106/58 | HR 76 | Ht 70.0 in | Wt 227.0 lb

## 2022-06-08 DIAGNOSIS — I4821 Permanent atrial fibrillation: Secondary | ICD-10-CM

## 2022-06-08 DIAGNOSIS — I25119 Atherosclerotic heart disease of native coronary artery with unspecified angina pectoris: Secondary | ICD-10-CM | POA: Diagnosis not present

## 2022-06-08 DIAGNOSIS — I5032 Chronic diastolic (congestive) heart failure: Secondary | ICD-10-CM | POA: Diagnosis not present

## 2022-06-08 MED ORDER — FUROSEMIDE 40 MG PO TABS: ORAL_TABLET | ORAL | 3 refills | Status: DC

## 2022-06-08 NOTE — Patient Instructions (Signed)
Medication Instructions:  Take Lasix 40 mg every Monday and Thursday  Labwork: BMET in 1 months  Testing/Procedures: None today  Follow-Up: 1 month  Any Other Special Instructions Will Be Listed Below (If Applicable).  If you need a refill on your cardiac medications before your next appointment, please call your pharmacy.

## 2022-06-13 ENCOUNTER — Other Ambulatory Visit (HOSPITAL_COMMUNITY): Payer: Self-pay | Admitting: Family Medicine

## 2022-06-13 DIAGNOSIS — R1031 Right lower quadrant pain: Secondary | ICD-10-CM

## 2022-06-13 DIAGNOSIS — B356 Tinea cruris: Secondary | ICD-10-CM | POA: Diagnosis not present

## 2022-06-13 DIAGNOSIS — I509 Heart failure, unspecified: Secondary | ICD-10-CM | POA: Diagnosis not present

## 2022-06-13 DIAGNOSIS — K4090 Unilateral inguinal hernia, without obstruction or gangrene, not specified as recurrent: Secondary | ICD-10-CM | POA: Diagnosis not present

## 2022-06-13 DIAGNOSIS — I4821 Permanent atrial fibrillation: Secondary | ICD-10-CM | POA: Diagnosis not present

## 2022-06-19 ENCOUNTER — Ambulatory Visit (HOSPITAL_COMMUNITY)
Admission: RE | Admit: 2022-06-19 | Discharge: 2022-06-19 | Disposition: A | Discharge status: Home / Self Care | Payer: Medicare Other | Source: Ambulatory Visit | Attending: Family Medicine | Admitting: Family Medicine

## 2022-06-19 DIAGNOSIS — N200 Calculus of kidney: Secondary | ICD-10-CM | POA: Diagnosis not present

## 2022-06-19 DIAGNOSIS — R1031 Right lower quadrant pain: Secondary | ICD-10-CM | POA: Diagnosis not present

## 2022-06-19 MED ORDER — IOHEXOL 300 MG/ML  SOLN
100.0000 mL | Freq: Once | INTRAMUSCULAR | Status: AC | PRN
  Administered 2022-06-19: 80 mL via INTRAVENOUS

## 2022-06-20 DIAGNOSIS — E291 Testicular hypofunction: Secondary | ICD-10-CM | POA: Diagnosis not present

## 2022-06-20 DIAGNOSIS — R972 Elevated prostate specific antigen [PSA]: Secondary | ICD-10-CM | POA: Diagnosis not present

## 2022-06-20 DIAGNOSIS — Z8042 Family history of malignant neoplasm of prostate: Secondary | ICD-10-CM | POA: Diagnosis not present

## 2022-06-20 DIAGNOSIS — N138 Other obstructive and reflux uropathy: Secondary | ICD-10-CM | POA: Diagnosis not present

## 2022-06-20 DIAGNOSIS — N401 Enlarged prostate with lower urinary tract symptoms: Secondary | ICD-10-CM | POA: Diagnosis not present

## 2022-06-26 ENCOUNTER — Encounter: Payer: Self-pay | Admitting: Cardiology

## 2022-06-26 NOTE — Telephone Encounter (Signed)
Error

## 2022-06-28 ENCOUNTER — Telehealth: Payer: Self-pay | Admitting: Cardiology

## 2022-06-28 NOTE — Telephone Encounter (Signed)
Left a message for patient to call office back in regards to upcoming hernia surgery.

## 2022-06-28 NOTE — Telephone Encounter (Signed)
Patient called stating he has an appt on 07/07/22 with [facility] for a hernia he has developed. That was the soonest they were able to get him in.  They advised him he could go to the emergency room if he wanted surgery sooner for his hernia, since they have a surgeon on call. He feels that is not the way to go since he had a heart attack back in January, and is on blood thinners.  He feels there should be more preparation prior to his surgery.

## 2022-06-29 ENCOUNTER — Encounter (HOSPITAL_COMMUNITY): Payer: Medicare Other

## 2022-06-29 NOTE — Telephone Encounter (Signed)
Returned call to pt. No answer. Left msg to call back.  

## 2022-07-03 NOTE — Progress Notes (Unsigned)
Office Visit    Patient Name: Arthur NianCharles A Deist Date of Encounter: 07/04/2022  Primary Care Provider:  Carylon PerchesFagan, Roy, MD Primary Cardiologist:  Nona DellSamuel McDowell, MD Primary Electrophysiologist: None  Chief Complaint    Arthur Wilson is a 77 y.o. male with PMH of CAD s/p NSTEMI 03/2022 [facility] and [facility] PCI to pLAD, HFpEF, OSA (on CPAP), HTN, HLD who presents today for 4-week follow-up of HFpEF and surgical clearance.  Past Medical History    Past Medical History:  Diagnosis Date   Atrial fibrillation    BPH (benign prostatic hyperplasia)    Cervical spondylosis without myelopathy    Essential hypertension    Impaired glucose tolerance    OSA on CPAP    Testicular hypofunction    Past Surgical History:  Procedure Laterality Date   CORONARY ATHERECTOMY N/A 03/31/2022   Procedure: CORONARY ATHERECTOMY;  Surgeon: Orbie Pyohukkani, Arun K, MD;  Location: [facility];  Service: Cardiovascular;  Laterality: N/A;   CORONARY PRESSURE/FFR STUDY N/A 03/30/2022   Procedure: INTRAVASCULAR PRESSURE WIRE/FFR STUDY;  Surgeon: Orbie Pyohukkani, Arun K, MD;  Location: [facility];  Service: Cardiovascular;  Laterality: N/A;   CORONARY STENT INTERVENTION N/A 03/30/2022   Procedure: CORONARY STENT INTERVENTION;  Surgeon: Orbie Pyohukkani, Arun K, MD;  Location: [facility];  Service: Cardiovascular;  Laterality: N/A;   CORONARY STENT INTERVENTION N/A 03/31/2022   Procedure: CORONARY STENT INTERVENTION;  Surgeon: Orbie Pyohukkani, Arun K, MD;  Location: [facility];  Service: Cardiovascular;  Laterality: N/A;   FOOT SURGERY  01/10/2016   Bunion removal   KNEE SURGERY  1992   removal of ACL from ski fall   RIGHT/LEFT HEART CATH AND CORONARY ANGIOGRAPHY N/A 03/30/2022   Procedure: RIGHT/LEFT HEART CATH AND CORONARY ANGIOGRAPHY;  Surgeon: Orbie Pyohukkani, Arun K, MD;  Location: [facility];  Service: Cardiovascular;  Laterality: N/A;    Allergies  No Known Allergies  History of Present  Illness    Arthur Wilson  is a 77 year old male with the above mention past medical history who presents today for 4-week follow-up of HFpEF and surgical clearance.  Arthur Wilson was initially seen by Dr. Diona BrownerMcDowell in 2021 for referral of atrial fibrillation.  2D echo was completed showing EF of 60 to 65% with normal RV, mildly dilated LA and RA with no valvular abnormalities.  He was treated with rhythm control and continued Eliquis which was started by his PCP.  [facility] on 03/[facility]/2022 with complaint of hypotension.  He reports suffering a mechanical fall during the holidays but denied any syncope or dizziness.  He reported progressive leg swelling.  He had been given 40 of Lasix due to weeping lower extremity edema by his PCP.  Blood pressure was 83/64 with a troponin of 3628, BNP was 531 and patient was started on IV heparin and hydration and [facility].  2D echo completed showing EF of 50-55%, mild inferior mid to basal hypokinesis.  Creatinine improved to 1.44 with hydration and patient underwent LHC on 03/30/2022.  Cath revealed 90% first OM treated medically, 70% proximal RCA that was treated with DES x 1 and 75% stenosed proximal LAD that was treated with staged PCI and DES.  He was started on triple therapy for 4 weeks then continue Plavix and Eliquis for 6 months.  He was referred to the advanced heart failure clinic and was  seen on 04/21/2022.  During visit patient was euvolemic and tolerating diuretics as needed.  He was advised to continue compression stockings and no further titration to GDMT was made.  He was seen most recently by Dr. Diona Browner on 06/08/2022 for follow-up.  He was noted to have some lower extremity swelling and increased weight and Lasix was increased to 40 mg twice a week.  Arthur Wilson presents today with his wife for follow-up of congestive heart failure and coronary artery disease.  He is scheduled to have a surgical  consultation on Friday to discuss possible hernia repair procedure.  He reports since his previous visit on 314 that he is doing well with no shortness of breath or chest discomfort.  He is able to lay flat without any shortness of breath and denies any bleeding issues with current medication regimen.  He is abstaining from excess salt in his diet and is drinking at least 64 ounces of fluid daily.  He is euvolemic on exam with exception of +1 lower extremity edema bilaterally.  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  He is currently using a walker to ambulate however he is able to walk without ambulation and uses walker due to his hernia.    Patient denies chest pain, palpitations, dyspnea, PND, orthopnea, nausea, vomiting, dizziness, syncope, edema, weight gain, or early satiety.   Home Medications    Current Outpatient Medications  Medication Sig Dispense Refill   acetaminophen (TYLENOL) 500 MG tablet Take 1,000 mg by mouth every 6 (six) hours as needed for moderate pain.     ALPRAZolam (XANAX) 0.5 MG tablet Take 0.5 tablets by mouth at bedtime as needed for anxiety or sleep.     atorvastatin (LIPITOR) 40 MG tablet Take 1 tablet (40 mg total) by mouth daily. 30 tablet 0   clopidogrel (PLAVIX) 75 MG tablet Take 1 tablet (75 mg total) by mouth daily with breakfast. 30 tablet 1   Continuous Blood Gluc Sensor (DEXCOM G7 SENSOR) MISC for 30 Days     ELIQUIS 5 MG TABS tablet Take 5 mg by mouth 2 (two) times daily.     empagliflozin (JARDIANCE) 10 MG TABS tablet Take 1 tablet (10 mg total) by mouth daily. 30 tablet 0   fexofenadine (ALLEGRA) 180 MG tablet Take 180 mg by mouth as needed for allergies or rhinitis.     fluticasone (FLONASE) 50 MCG/ACT nasal spray Place 1 spray into both nostrils as needed for allergies.     furosemide (LASIX) 40 MG tablet Take 40 mg by mouth every Monday  and Thursday 60 tablet 3   HYDROcodone-acetaminophen (NORCO) 7.5-325 MG tablet Take 0.5-1 tablets by mouth 2 (two) times daily as needed for moderate pain.     metFORMIN (GLUCOPHAGE) 500 MG tablet Take 500 mg by mouth 2 (two) times daily with a meal.     metoprolol succinate (TOPROL-XL) 100 MG 24 hr tablet Take 1 tablet (100 mg total) by mouth 2 (two) times daily. Take with or immediately following a meal. 60 tablet 0   pantoprazole (PROTONIX) 40 MG tablet Take 1 tablet (40 mg total) by mouth daily at 12 noon. 30 tablet 0   No current facility-administered medications for this visit.     Review of Systems  Please see the history of present illness.    (+) Right groin pain  All other systems reviewed and are otherwise negative except as noted above.  Physical Exam    Wt Readings from Last 3 Encounters:  07/04/22 218 lb 6.4 oz (99.1 kg)  06/08/22 227 lb (103 kg)  04/21/22 221 lb 9.6 oz (100.5 kg)   VS: Vitals:   07/04/22 0802  BP: 112/64  Pulse: 70  SpO2: 95%  ,Body mass index is 31.34 kg/m.  Constitutional:      Appearance: Healthy appearance. Not in distress.  Neck:     Vascular: JVD normal.  Pulmonary:     Effort: Pulmonary effort is normal.     Breath sounds: No wheezing. No rales. Diminished in the bases Cardiovascular:     Irregularly irregular normal S1. Normal S2.      Murmurs: There is no murmur.  Edema:    +1 lower extremity edema bilaterally Abdominal:     Palpations: Abdomen is soft non tender. There is no hepatomegaly.  Skin:    General: Skin is warm and dry.  Neurological:     General: No focal deficit present.     Mental Status: Alert and oriented to person, place and time.     Cranial Nerves: Cranial nerves are intact.  EKG/LABS/ Recent Cardiac Studies    ECG personally reviewed by me today -atrial fibrillation with rate of 70 bpm and incomplete right bundle branch blocks with no acute changes consistent with previous EKG.  Risk  Assessment/Calculations:    CHA2DS2-VASc Score = 4   This indicates a 4.8% annual risk of stroke. The patient's score is based upon: CHF History: 1 HTN History: 1 Diabetes History: 1 Stroke History: 0 Vascular Disease History: 0 Age Score: 1 Gender Score: 0           Lab Results  Component Value Date   WBC 8.2 05/19/2022   HGB 11.7 (L) 05/19/2022   HCT 36.6 (L) 05/19/2022   MCV 95.8 05/19/2022   PLT 233 05/19/2022   Lab Results  Component Value Date   CREATININE 1.59 (H) 05/19/2022   BUN 24 (H) 05/19/2022   NA 140 05/19/2022   K 4.3 05/19/2022   CL 104 05/19/2022   CO2 26 05/19/2022   Lab Results  Component Value Date   ALT 75 (H) 03/31/2022   AST 33 03/31/2022   ALKPHOS 32 (L) 03/31/2022   BILITOT 1.7 (H) 03/31/2022   No results found for: "CHOL", "HDL", "LDLCALC", "LDLDIRECT", "TRIG", "CHOLHDL"  Lab Results  Component Value Date   HGBA1C 7.4 (H) 03/29/2022    Cardiac Studies & Procedures   CARDIAC CATHETERIZATION  CARDIAC CATHETERIZATION 04/03/2022  Narrative   Prox LAD lesion is 75% stenosed.   1st Mrg lesion is 90% stenosed.   Non-stenotic Ost RCA to Prox RCA lesion was previously treated.   Non-stenotic Prox RCA lesion was previously treated.   A stent was successfully placed.   Post intervention, there is a 0% residual stenosis.  1.  Successful PCI of proximal LAD aided by orbital atherectomy with OCT optimization. 2.  Residual high-grade and highly calcified lesion of the obtuse marginal; given the relatively small territory subtended by this vessel and that the patient's symptoms are dyspnea rather than angina, after discussion with Dr. Anne Fu medical therapy will be pursued for this burden of disease. 3.  LVEDP of 10 mmHg but obscured by widely fluctuating RR intervals due to atrial fibrillation.  Recommendation: Triple therapy for ideally 4 weeks then Plavix and Eliquis for 6 months in total then Eliquis  monotherapy  thereafter.  Findings Coronary Findings Diagnostic  Dominance: Right  Left Anterior Descending Prox LAD lesion is 75% stenosed.  Left Circumflex  First Obtuse Marginal Branch 1st Mrg lesion is 90% stenosed.  Right Coronary Artery Non-stenotic Ost RCA to Prox RCA lesion was previously treated. Non-stenotic Prox RCA lesion was previously treated.  Intervention  Prox LAD lesion Stent A stent was successfully placed. Post-Intervention Lesion Assessment The intervention was successful. Pre-interventional TIMI flow is 3. Post-intervention TIMI flow is 3. There is a 0% residual stenosis post intervention.   CARDIAC CATHETERIZATION 03/30/2022  Narrative   Prox LAD lesion is 75% stenosed.   1st Mrg lesion is 90% stenosed.   Ost RCA to Prox RCA lesion is 60% stenosed.   Prox RCA lesion is 70% stenosed.   A stent was successfully placed.   Post intervention, there is a 0% residual stenosis.   Post intervention, there is a 0% residual stenosis.  1.  High-grade disease of proximal LAD and obtuse marginal with RFR positive ostial and proximal to mid right coronary artery disease. 2.  After review with Dr. Anne Fu, PCI of the right coronary artery was pursued with 2 overlapping drug-eluting stents placed. 3.  Due to dye expenditure, PCI of the proximal LAD and left circumflex will be staged depending on renal function. 4.  LVEDP of 9 mmHg. 5.  Fick cardiac output of 5.5 L/min and Fick cardiac index of 2.4 L/min/m with a mean RA pressure of 11 mmHg, mean wedge pressure of 18 mmHg, and mean PA pressure of 30 mmHg. 6.  Very difficult cannulation of right coronary artery; please see procedural details.  Recommendation: Given history of atrial fibrillation the patient will be maintained on triple therapy for 1 month and then Plavix and Eliquis for 6 months in total and then Eliquis indefinitely.  Depending on the patient's creatinine staged PCI of the LAD and left circumflex will be  pursued.  Findings Coronary Findings Diagnostic  Dominance: Right  Left Anterior Descending Prox LAD lesion is 75% stenosed.  Left Circumflex  First Obtuse Marginal Branch 1st Mrg lesion is 90% stenosed.  Right Coronary Artery Ost RCA to Prox RCA lesion is 60% stenosed. Prox RCA lesion is 70% stenosed.  Intervention  Ost RCA to Prox RCA lesion Stent (Also treats lesions: Prox RCA) A stent was successfully placed. Post-Intervention Lesion Assessment The intervention was successful. Pre-interventional TIMI flow is 3. Post-intervention TIMI flow is 3. There is a 0% residual stenosis post intervention.  Prox RCA lesion Stent (Also treats lesions: Ost RCA to Prox RCA) See details in Ost RCA to Prox RCA lesion. Post-Intervention Lesion Assessment The intervention was successful. Pre-interventional TIMI flow is 3. Post-intervention TIMI flow is 3. There is a 0% residual stenosis post intervention.     ECHOCARDIOGRAM  ECHOCARDIOGRAM COMPLETE 03/29/2022  Narrative ECHOCARDIOGRAM REPORT    Patient Name:   MARE LUDTKE Date of Exam: 03/29/2022 Medical Rec #:  [id number]       Height:       70.0 in Accession #:    [id number]      Weight:       246.4 lb Date of Birth:  1945-08-05      BSA:          2.281 m Patient Age:    76 years        BP:           15/77 mmHg Patient Gender: M  HR:           85 bpm. [facility]  Procedure: 2D Echo, Cardiac Doppler and Color Doppler  Indications:    CHF-Acute Diastolic I50.31  History:        Patient has prior history of Echocardiogram examinations, most recent 11/05/2019. Previous Myocardial Infarction and CAD, Arrythmias:Atrial Fibrillation; Risk Factors:Hypertension and Diabetes. OSA (obstructive sleep apnea) (From Hx).  Sonographer:    Celesta Gentile RCS Referring Phys: [id number] OLADAPO ADEFESO  IMPRESSIONS   1. Left ventricular ejection fraction, by estimation, is 50 to 55%. The left ventricle has  low normal function. The left ventricle demonstrates regional wall motion abnormalities - cannot exclude mild inferior mid to basal hypokinesis. There is mild left ventricular hypertrophy. Left ventricular diastolic parameters are indeterminate. 2. Right ventricular systolic function is mildly reduced. The right ventricular size is normal. There is normal pulmonary artery systolic pressure. The estimated right ventricular systolic pressure is 33.5 mmHg. 3. Left atrial size was moderately dilated. 4. Right atrial size was mildly dilated. 5. The mitral valve is grossly normal. Mild to moderate mitral valve regurgitation. 6. The aortic valve is tricuspid. There is mild calcification of the aortic valve. Aortic valve regurgitation is not visualized. 7. The inferior vena cava is normal in size with greater than 50% respiratory variability, suggesting right atrial pressure of 3 mmHg.  Comparison(s): Prior images reviewed side by side. LVEF low normal at 50-55% with possible mild, mid to basal inferior hypokinesis. Mild RV dysfunction with upper normal estimated RVSP.  FINDINGS Left Ventricle: Left ventricular ejection fraction, by estimation, is 50 to 55%. The left ventricle has low normal function. The left ventricle demonstrates regional wall motion abnormalities. The left ventricular internal cavity size was normal in size. There is mild left ventricular hypertrophy. Left ventricular diastolic function could not be evaluated due to atrial fibrillation. Left ventricular diastolic parameters are indeterminate.  Right Ventricle: The right ventricular size is normal. No increase in right ventricular wall thickness. Right ventricular systolic function is mildly reduced. There is normal pulmonary artery systolic pressure. The tricuspid regurgitant velocity is 2.76 m/s, and with an assumed right atrial pressure of 3 mmHg, the estimated right ventricular systolic pressure is 33.5 mmHg.  Left Atrium: Left  atrial size was moderately dilated.  Right Atrium: Right atrial size was mildly dilated.  Pericardium: Trivial pericardial effusion is present. The pericardial effusion is posterior to the left ventricle.  Mitral Valve: The mitral valve is grossly normal. Mild to moderate mitral valve regurgitation.  Tricuspid Valve: The tricuspid valve is grossly normal. Tricuspid valve regurgitation is mild.  Aortic Valve: The aortic valve is tricuspid. There is mild calcification of the aortic valve. Aortic valve regurgitation is not visualized.  Pulmonic Valve: The pulmonic valve was grossly normal. Pulmonic valve regurgitation is trivial.  Aorta: The aortic root is normal in size and structure.  Venous: The inferior vena cava is normal in size with greater than 50% respiratory variability, suggesting right atrial pressure of 3 mmHg.  IAS/Shunts: No atrial level shunt detected by color flow Doppler.   LEFT VENTRICLE PLAX 2D LVIDd:         4.70 cm LVIDs:         2.90 cm LV PW:         1.10 cm LV IVS:        1.30 cm LVOT diam:     2.10 cm LV SV:         59 LV SV  Index:   26 LVOT Area:     3.46 cm   RIGHT VENTRICLE TAPSE (M-mode): 1.5 cm  LEFT ATRIUM              Index        RIGHT ATRIUM           Index LA diam:        4.20 cm  1.84 cm/m   RA Area:     25.00 cm LA Vol (A2C):   128.0 ml 56.12 ml/m  RA Volume:   76.10 ml  33.36 ml/m LA Vol (A4C):   104.0 ml 45.60 ml/m LA Biplane Vol: 118.0 ml 51.73 ml/m AORTIC VALVE LVOT Vmax:   75.70 cm/s LVOT Vmean:  55.000 cm/s LVOT VTI:    0.169 m  AORTA Ao Root diam: 3.70 cm  MITRAL VALVE                TRICUSPID VALVE MV Area (PHT): 4.39 cm     TR Peak grad:   30.5 mmHg MV Decel Time: 173 msec     TR Vmax:        276.00 cm/s MV E velocity: 115.00 cm/s SHUNTS Systemic VTI:  0.17 m Systemic Diam: 2.10 cm  Nona Dell MD Electronically signed by Nona Dell MD Signature Date/Time: 03/29/2022/11:42:43 AM    Final              Assessment & Plan    1.  The patient affirms he has been doing well without any new cardiac symptoms. They are able to achieve 4 METS without cardiac limitations. Therefore, based on ACC/AHA guidelines, the patient would be at acceptable risk for the planned procedure without further cardiovascular testing. The patient was advised that if he develops new symptoms prior to surgery to contact our office to arrange for a follow-up visit, and he verbalized understanding.   Recommendations: According to ACC/AHA guidelines, no further cardiovascular testing needed.  The patient may proceed to surgery at acceptable risk.   Antiplatelet and/or Anticoagulation Recommendations: Guidance to hold Plavix and Eliquis will be given once formal surgical clearance request is made.  2.  Coronary artery disease: -S/p NSTEMI 03/2022 treated with DES x 2 to RCA and to LAD with 90% OM treated medically -Today patient reports no shortness of breath or dizziness since previous visit. -Continue GDMT with Plavix 75 mg daily, Lipitor 40 mg daily, and metoprolol 100 mg twice daily.  3.  HFpEF: -2D echo showing decreased EF of 50-55% -During today's visit patient is euvolemic on exam and denies any shortness of breath. -He has been compliant with his Lasix 40 mg twice weekly and reports abstaining from excess salt in his diet. -We will check BMET today with anticipation to add spironolactone 12.5 mg if creatinine and potassium are normal. -Continue Jardiance 10 mg daily, Lasix 40 mg twice weekly, Toprol-XL 100 mg twice daily.  4.  Permanent AF: -Patient currently rate controlled atrial fibrillation with rate of 70 bpm  -Continue Eliquis 5 mg twice daily  -Recent CBC was 3.95 and creatinine was 1.59 -CHA2DS2-VASc Score = 4 [CHF History: 1, HTN History: 1, Diabetes History: 1, Stroke History: 0, Vascular Disease History: 0, Age Score: 1, Gender Score: 0].  Therefore, the patient's annual risk of stroke is 4.8 %.       5.  CKD stage IIIb: -Patient's previous creatinine was 1.59 -We will recheck BMET today -Continue Jardiance 10 mg daily  Disposition: Follow-up with Nona Dell, MD  or APP as scheduled      Medication Adjustments/Labs and Tests Ordered: Current medicines are reviewed at length with the patient today.  Concerns regarding medicines are outlined above.   Signed, Napoleon Form, Leodis Rains, NP 07/04/2022, 8:45 AM [facility]  Note:  This document was prepared using Dragon voice recognition software and may include unintentional dictation errors.

## 2022-07-03 NOTE — Telephone Encounter (Signed)
Scheduled pt for Arthur Coke, NP on 4/9 @ 8 am in [facility] office. Pt notified.

## 2022-07-03 NOTE — Telephone Encounter (Signed)
Left a message for patient to call office back in regards to upcoming hernia surgery.  

## 2022-07-04 ENCOUNTER — Ambulatory Visit: Payer: Medicare Other | Attending: Nurse Practitioner | Admitting: Nurse Practitioner

## 2022-07-04 ENCOUNTER — Encounter: Payer: Self-pay | Admitting: Nurse Practitioner

## 2022-07-04 VITALS — BP 112/64 | HR 70 | Ht 70.0 in | Wt 218.4 lb

## 2022-07-04 DIAGNOSIS — I2510 Atherosclerotic heart disease of native coronary artery without angina pectoris: Secondary | ICD-10-CM

## 2022-07-04 DIAGNOSIS — I4891 Unspecified atrial fibrillation: Secondary | ICD-10-CM | POA: Diagnosis not present

## 2022-07-04 DIAGNOSIS — I5032 Chronic diastolic (congestive) heart failure: Secondary | ICD-10-CM | POA: Diagnosis not present

## 2022-07-04 DIAGNOSIS — Z01810 Encounter for preprocedural cardiovascular examination: Secondary | ICD-10-CM | POA: Diagnosis not present

## 2022-07-04 DIAGNOSIS — N1832 Chronic kidney disease, stage 3b: Secondary | ICD-10-CM

## 2022-07-04 NOTE — Patient Instructions (Signed)
Medication Instructions:  Your physician recommends that you continue on your current medications as directed. Please refer to the Current Medication list given to you today. *If you need a refill on your cardiac medications before your next appointment, please call your pharmacy*   Lab Work: TODAY-BMET  If you have labs (blood work) drawn today and your tests are completely normal, you will receive your results only by: MyChart Message (if you have MyChart) OR A paper copy in the mail If you have any lab test that is abnormal or we need to change your treatment, we will call you to review the results.   Testing/Procedures: NONE ORDERED   Follow-Up: At [facility], you and your health needs are our priority.  As part of our continuing mission to provide you with exceptional heart care, we have created designated Provider Care Teams.  These Care Teams include your primary Cardiologist (physician) and Advanced Practice Providers (APPs -  Physician Assistants and Nurse Practitioners) who all work together to provide you with the care you need, when you need it.  We recommend signing up for the patient portal called "MyChart".  Sign up information is provided on this After Visit Summary.  MyChart is used to connect with patients for Virtual Visits (Telemedicine).  Patients are able to view lab/test results, encounter notes, upcoming appointments, etc.  Non-urgent messages can be sent to your provider as well.   To learn more about what you can do with MyChart, go to [email or web address].    Your next appointment:    FOLLOW UP AS SCHEDULED  Provider:   Randall An, PA-C   Other Instructions

## 2022-07-05 LAB — BASIC METABOLIC PANEL
BUN/Creatinine Ratio: 20 (ref 10–24)
BUN: 20 mg/dL (ref 8–27)
CO2: 26 mmol/L (ref 20–29)
Calcium: 9.4 mg/dL (ref 8.6–10.2)
Chloride: 100 mmol/L (ref 96–106)
Creatinine, Ser: 0.99 mg/dL (ref 0.76–1.27)
Glucose: 99 mg/dL (ref 70–99)
Potassium: 4.3 mmol/L (ref 3.5–5.2)
Sodium: 140 mmol/L (ref 134–144)
eGFR: 80 mL/min/{1.73_m2} (ref >59)

## 2022-07-05 NOTE — Addendum Note (Signed)
Addended byDurenda Hurt on: 07/05/2022 10:21 AM   Modules accepted: Orders

## 2022-07-07 ENCOUNTER — Ambulatory Visit: Payer: Self-pay | Admitting: Surgery

## 2022-07-07 DIAGNOSIS — K4020 Bilateral inguinal hernia, without obstruction or gangrene, not specified as recurrent: Secondary | ICD-10-CM | POA: Diagnosis not present

## 2022-07-07 NOTE — H&P (Signed)
Subjective   Chief Complaint: New Consultation and Inguinal Hernia   PCP - Carylon Perches Referring - John Giovanni Cardiologist - [place]  History of Present Illness: Arthur Wilson is a 77 y.o. male who is seen today as an office consultation at the request of Dr. Charlotta Newton for evaluation of New Consultation and Inguinal Hernia .   This is a 77 year old male with multiple medical comorbidities including atrial fibrillation and recent NSTEMI in January of this year.  He has 2 new drug-eluting stents.  He is anticoagulated on Plavix and Eliquis.  His cardiac function has improved.  The patient has had known bilateral inguinal hernias for at least a year.  These were seen on a CT scan in June 2023.  However recently, the patient has noticed increasing pain and swelling in the right groin.  This hernia remains reducible.  He denies any obstructive symptoms.  However the discomfort is increasing.  He has attempted to wear a truss over this area with limited success.  He presents to discuss surgical repair.  Recent CT scan showed small bowel within the right inguinal hernia.  Review of Systems: A complete review of systems was obtained from the patient.  I have reviewed this information and discussed as appropriate with the patient.  See HPI as well for other ROS.  Review of Systems  Constitutional:  Positive for weight loss.  HENT: Negative.    Eyes: Negative.   Respiratory: Negative.    Cardiovascular:  Positive for leg swelling.  Gastrointestinal:  Positive for abdominal pain.  Genitourinary: Negative.   Musculoskeletal:  Positive for neck pain.  Skin: Negative.   Neurological: Negative.   Endo/Heme/Allergies:  Bruises/bleeds easily.  Psychiatric/Behavioral: Negative.        Medical History: Past Medical History:  Diagnosis Date   Anxiety    Arthritis    Diabetes mellitus without complication (CMS/HHS-HCC)    Sleep apnea     Patient Active Problem List  Diagnosis    Non-recurrent bilateral inguinal hernia without obstruction or gangrene   Acute diastolic CHF (congestive heart failure) (CMS/HHS-HCC)   AKI (acute kidney injury) (CMS-HCC)   Atrial fibrillation with rapid ventricular response (CMS/HHS-HCC)   Benign prostatic hyperplasia with urinary obstruction   Cervical spondylosis without myelopathy   Contusion of right knee   Elevated brain natriuretic peptide (BNP) level   Elevated prostate specific antigen (PSA)   NSTEMI (non-ST elevated myocardial infarction) (CMS/HHS-HCC)   OSA on CPAP   Type 2 diabetes mellitus with hyperglycemia (CMS/HHS-HCC)    Past Surgical History:  Procedure Laterality Date   Coronary atherectomy     Coronary stent intervention      Right/left heart cath and coronary angiography       No Known Allergies  Current Outpatient Medications on File Prior to Visit  Medication Sig Dispense Refill   acetaminophen (TYLENOL) 500 MG tablet Take by mouth     ALPRAZolam (XANAX) 0.5 MG tablet      atorvastatin (LIPITOR) 40 MG tablet Take 40 mg by mouth once daily     clopidogreL (PLAVIX) 75 mg tablet Take by mouth     DEXCOM G7 SENSOR Devi      ELIQUIS 5 mg tablet      empagliflozin (JARDIANCE) 10 mg tablet Take 1 tablet by mouth once daily     FUROsemide (LASIX) 40 MG tablet Take 40 mg by mouth every Monday and Thursday     HYDROcodone-acetaminophen (NORCO) 7.5-325 mg tablet Take 1 tablet every 4  hours by oral route.     metFORMIN (GLUCOPHAGE) 500 MG tablet      metoprolol succinate (TOPROL-XL) 100 MG XL tablet Take by mouth     pantoprazole (PROTONIX) 40 MG DR tablet Take by mouth     No current facility-administered medications on file prior to visit.    Family History  Problem Relation Age of Onset   Obesity Mother    Deep vein thrombosis (DVT or abnormal blood clot formation) Mother    Breast cancer Mother    High blood pressure (Hypertension) Father    Hyperlipidemia (Elevated cholesterol) Father    Coronary  Artery Disease (Blocked arteries around heart) Father    Obesity Sister    Hyperlipidemia (Elevated cholesterol) Sister    Coronary Artery Disease (Blocked arteries around heart) Sister      Social History   Tobacco Use  Smoking Status Never  Smokeless Tobacco Never     Social History   Socioeconomic History   Marital status: Married  Tobacco Use   Smoking status: Never   Smokeless tobacco: Never  Substance and Sexual Activity   Alcohol use: Not Currently   Drug use: Never    Objective:    Vitals:   07/07/22 1023  BP: 105/68  Pulse: 86  Temp: 36.7 C (98 F)  SpO2: 92%  Weight: 98 kg (216 lb)  Height: 177.8 cm ( )    Body mass index is 30.99 kg/m.  Physical Exam   Constitutional:  WDWN in NAD, conversant, no obvious deformities; lying in bed comfortably Eyes:  Pupils equal, round; sclera anicteric; moist conjunctiva; no lid lag HENT:  Oral mucosa moist; good dentition  Neck:  No masses palpated, trachea midline; no thyromegaly Lungs:  CTA bilaterally; normal respiratory effort CV:  Regular rate and rhythm; no murmurs; extremities well-perfused with no edema Abd:  +bowel sounds, soft, non-tender, no palpable organomegaly; no palpable hernias GU: Bilateral descended testes, no testicular masses.  Large right inguinal hernia that is reducible when supine.  Small spontaneously reducible left inguinal hernia Musc: Ambulates slowly with a walker.  No apparent clubbing or cyanosis in extremities Lymphatic:  No palpable cervical or axillary lymphadenopathy Skin:  Warm, dry; no sign of jaundice Psychiatric - alert and oriented x 4; calm mood and affect   Labs, Imaging and Diagnostic Testing: CLINICAL DATA:  Right lower quadrant pain   EXAM: CT ABDOMEN AND PELVIS WITH CONTRAST   TECHNIQUE: Multidetector CT imaging of the abdomen and pelvis was performed using the standard protocol following bolus administration of intravenous contrast.   RADIATION DOSE  REDUCTION: This exam was performed according to the departmental dose-optimization program which includes automated exposure control, adjustment of the mA and/or kV according to patient size and/or use of iterative reconstruction technique.   CONTRAST:  80mL OMNIPAQUE IOHEXOL 300 MG/ML  SOLN   COMPARISON:  CT abdomen and pelvis dated October 05, 2021.   FINDINGS: Lower chest: Small solid pulmonary nodule of the left lower lobe measuring 5 mm, unchanged compared with the prior exam. Coronary artery calcifications.   Hepatobiliary: No focal liver abnormality is seen. No gallstones, gallbladder wall thickening, or biliary dilatation.   Pancreas: Unremarkable. No pancreatic ductal dilatation or surrounding inflammatory changes.   Spleen: Normal in size without focal abnormality.   Adrenals/Urinary Tract: Bilateral adrenal glands are unremarkable. No hydronephrosis. Nonobstructing stone of the upper pole of the left kidney measuring 6 mm, unchanged when compared with the prior. Small diverticulum of the right lateral bladder  wall.   Stomach/Bowel: Mildly dilated loops of small bowel are seen within a moderate sized right inguinal hernia. No intra-abdominal upstream dilation of the small bowel. Normal appendix. No evidence of bowel wall thickening or hypoenhancement. Normal appearance of the stomach.   Vascular/Lymphatic: Aortic atherosclerosis. No enlarged abdominal or pelvic lymph nodes.   Reproductive: Prostatomegaly, measuring up to 5.5 cm.   Other: Moderate size right inguinal hernia described above. Small left inguinal hernia.   Musculoskeletal: Mild levocurvature of the lumbar spine. Degenerative disc disease of the lumbar spine. No acute osseous abnormality.   IMPRESSION: 1. A mildly dilated loop of small bowel is seen within a moderate sized right inguinal hernia, previously only fat containing. No upstream dilation of the small bowel. Correlate clinically  for reducibility. 2. Small solid pulmonary nodule of the left lower lobe measuring 5 mm. No follow-up needed if patient is low-risk.This recommendation follows the consensus statement: Guidelines for Management of Incidental Pulmonary Nodules Detected on CT Images: From the Fleischner Society 2017; Radiology 2017; 284:228-243. 3. Aortic Atherosclerosis (ICD10-I70.0).   Results were called by telephone at the time of interpretation on 06/19/2022 at 5:37 pm to provider Gareth Morgan , who verbally acknowledged these results.     Electronically Signed   By: Allegra Lai M.D.   On: 06/19/2022 17:39  Assessment and Plan:  Diagnoses and all orders for this visit:  Non-recurrent bilateral inguinal hernia without obstruction or gangrene    Recommend bilateral inguinal hernia repairs with mesh.  Due to the large size of the right inguinal hernia, recommend open repair.  We will obtain cardiac clearance from his cardiologist.  He will need to hold his blood thinners prior to surgery.  He may restart these the day after surgery.  We will likely keep him overnight due to his recent cardiac event and comorbidities.   Arthur Severns Delbert Harness, MD  07/07/2022 11:17 AM

## 2022-07-10 DIAGNOSIS — I4821 Permanent atrial fibrillation: Secondary | ICD-10-CM | POA: Diagnosis not present

## 2022-07-10 DIAGNOSIS — I5032 Chronic diastolic (congestive) heart failure: Secondary | ICD-10-CM | POA: Diagnosis not present

## 2022-07-10 DIAGNOSIS — D6869 Other thrombophilia: Secondary | ICD-10-CM | POA: Diagnosis not present

## 2022-07-10 DIAGNOSIS — E261 Secondary hyperaldosteronism: Secondary | ICD-10-CM | POA: Diagnosis not present

## 2022-07-12 ENCOUNTER — Encounter: Payer: Self-pay | Admitting: Student

## 2022-07-12 ENCOUNTER — Ambulatory Visit: Payer: Medicare Other | Attending: Student | Admitting: Student

## 2022-07-12 VITALS — BP 116/68 | HR 82 | Ht 70.0 in | Wt 212.0 lb

## 2022-07-12 DIAGNOSIS — I4821 Permanent atrial fibrillation: Secondary | ICD-10-CM | POA: Diagnosis not present

## 2022-07-12 DIAGNOSIS — G4733 Obstructive sleep apnea (adult) (pediatric): Secondary | ICD-10-CM

## 2022-07-12 DIAGNOSIS — I2510 Atherosclerotic heart disease of native coronary artery without angina pectoris: Secondary | ICD-10-CM

## 2022-07-12 DIAGNOSIS — I10 Essential (primary) hypertension: Secondary | ICD-10-CM

## 2022-07-12 DIAGNOSIS — Z01810 Encounter for preprocedural cardiovascular examination: Secondary | ICD-10-CM

## 2022-07-12 DIAGNOSIS — E785 Hyperlipidemia, unspecified: Secondary | ICD-10-CM

## 2022-07-12 NOTE — Patient Instructions (Signed)
Medication Instructions:   Hold Plavix 5 Day prior to surgery  Hold Eliquis 2 Days prior to surgery  Take Aspirin 81 mg while holding Plavix  Resume Plavix and Eliquis after surgery.   *If you need a refill on your cardiac medications before your next appointment, please call your pharmacy*   Lab Work: NONE   If you have labs (blood work) drawn today and your tests are completely normal, you will receive your results only by: MyChart Message (if you have MyChart) OR A paper copy in the mail If you have any lab test that is abnormal or we need to change your treatment, we will call you to review the results.   Testing/Procedures: NONE    Follow-Up: At [facility], you and your health needs are our priority.  As part of our continuing mission to provide you with exceptional heart care, we have created designated Provider Care Teams.  These Care Teams include your primary Cardiologist (physician) and Advanced Practice Providers (APPs -  Physician Assistants and Nurse Practitioners) who all work together to provide you with the care you need, when you need it.  We recommend signing up for the patient portal called "MyChart".  Sign up information is provided on this After Visit Summary.  MyChart is used to connect with patients for Virtual Visits (Telemedicine).  Patients are able to view lab/test results, encounter notes, upcoming appointments, etc.  Non-urgent messages can be sent to your provider as well.   To learn more about what you can do with MyChart, go to [email or web address].    Your next appointment:   3 month(s)  Provider:   Nona Dell, MD    Other Instructions Thank you for choosing [facility]!

## 2022-07-12 NOTE — Progress Notes (Signed)
Cardiology Office Note    Date:  07/12/2022  ID:  Arthur Wilson, DOB 11/02/1945, MRN [id number] Cardiologist: Nona Dell, MD    History of Present Illness:    Arthur Wilson is a 77 y.o. male  with past medical history of CAD (s/p NSTEMI in 03/2022 with DESx2 to RCA and staged orbital atherectomy/DES placement to proximal LAD), permanent atrial fibrillation (rate-control pursued given asymptomatic state, OSA and atrial dilation), HTN, HLD and OSA who presents to the office today for 1 month follow-up.  He was examined by Dr. Diona Browner in 05/2022 and denied any recurrent anginal symptoms or palpitations at that time. He did appear slightly volume overloaded on examination, therefore Lasix was titrated to 40 mg twice weekly.  Was continued on Plavix and Eliquis for 6 months and then Eliquis monotherapy afterwards was recommended at the time of his catheterization.  In the interim, he was evaluated by Robin Searing, NP on 07/04/2022 for cardiac clearance in regards to upcoming inguinal hernia repair surgery. It was not felt that he would require further cardiac testing prior to his upcoming surgery and was recommend that guidance in regards to holding Plavix or Eliquis would be given once more formal surgical clearance has been requested. He was continued on Jardiance, Lasix and Toprol-XL for cardiomyopathy with plans to possibly add Spironolactone pending repeat labs.  In talking with the patient and his wife today, he reports his most limiting factor has been pain along his groin region as his hernia protrudes several times each day and he manually reduces it. Now using a walker for ambulation due to the pain. Denies any recent anginal symptoms. No recent chest pain, dyspnea on exertion, orthopnea or PND. Uses his CPAP at night. Has been taking Lasix twice weekly and says his lower extremity edema has been well-controlled with this.   Studies Reviewed:   EKG: EKG is not ordered today. Recent  EKG from 07/04/2022 is reviewed and demonstrates rate-controlled atrial fibrillation, HR 70 with no acute ST abnormalities.   Echocardiogram: 03/2022 IMPRESSIONS     1. Left ventricular ejection fraction, by estimation, is 50 to 55%. The  left ventricle has low normal function. The left ventricle demonstrates  regional wall motion abnormalities - cannot exclude mild inferior mid to  basal hypokinesis. There is mild  left ventricular hypertrophy. Left ventricular diastolic parameters are  indeterminate.   2. Right ventricular systolic function is mildly reduced. The right  ventricular size is normal. There is normal pulmonary artery systolic  pressure. The estimated right ventricular systolic pressure is 33.5 mmHg.   3. Left atrial size was moderately dilated.   4. Right atrial size was mildly dilated.   5. The mitral valve is grossly normal. Mild to moderate mitral valve  regurgitation.   6. The aortic valve is tricuspid. There is mild calcification of the  aortic valve. Aortic valve regurgitation is not visualized.   7. The inferior vena cava is normal in size with greater than 50%  respiratory variability, suggesting right atrial pressure of 3 mmHg.   Comparison(s): Prior images reviewed side by side. LVEF low normal at  50-55% with possible mild, mid to basal inferior hypokinesis. Mild RV  dysfunction with upper normal estimated RVSP.    R/LHC: 03/2022 Prox LAD lesion is 75% stenosed.   1st Mrg lesion is 90% stenosed.   Ost RCA to Prox RCA lesion is 60% stenosed.   Prox RCA lesion is 70% stenosed.   A stent was successfully  placed.   Post intervention, there is a 0% residual stenosis.   Post intervention, there is a 0% residual stenosis.   1.  High-grade disease of proximal LAD and obtuse marginal with RFR positive ostial and proximal to mid right coronary artery disease. 2.  After review with Dr. Anne Fu, PCI of the right coronary artery was pursued with 2 overlapping  drug-eluting stents placed. 3.  Due to dye expenditure, PCI of the proximal LAD and left circumflex will be staged depending on renal function. 4.  LVEDP of 9 mmHg. 5.  Fick cardiac output of 5.5 L/min and Fick cardiac index of 2.4 L/min/m with a mean RA pressure of 11 mmHg, mean wedge pressure of 18 mmHg, and mean PA pressure of 30 mmHg. 6.  Very difficult cannulation of right coronary artery; please see procedural details.   Recommendation: Given history of atrial fibrillation the patient will be maintained on triple therapy for 1 month and then Plavix and Eliquis for 6 months in total and then Eliquis indefinitely.  Depending on the patient's creatinine staged PCI of the LAD and left circumflex will be pursued.    Coronary Stent Intervention: 03/2022 Prox LAD lesion is 75% stenosed.   1st Mrg lesion is 90% stenosed.   Non-stenotic Ost RCA to Prox RCA lesion was previously treated.   Non-stenotic Prox RCA lesion was previously treated.   A stent was successfully placed.   Post intervention, there is a 0% residual stenosis.   1.  Successful PCI of proximal LAD aided by orbital atherectomy with OCT optimization. 2.  Residual high-grade and highly calcified lesion of the obtuse marginal; given the relatively small territory subtended by this vessel and that the patient's symptoms are dyspnea rather than angina, after discussion with Dr. Anne Fu medical therapy will be pursued for this burden of disease. 3.  LVEDP of 10 mmHg but obscured by widely fluctuating RR intervals due to atrial fibrillation.   Recommendation: Triple therapy for ideally 4 weeks then Plavix and Eliquis for 6 months in total then Eliquis monotherapy thereafter.   Physical Exam:   VS:  BP 116/68   Pulse 82   Ht  (1.778 m)   Wt 212 lb (96.2 kg)   SpO2 94%   BMI 30.42 kg/m    Wt Readings from Last 3 Encounters:  07/12/22 212 lb (96.2 kg)  07/04/22 218 lb 6.4 oz (99.1 kg)  06/08/22 227 lb (103 kg)     GEN:  Pleasant male appearing in no acute distress NECK: No JVD; No carotid bruits CARDIAC: Irregularly irregular, no murmurs, rubs, gallops RESPIRATORY:  Clear to auscultation without rales, wheezing or rhonchi  ABDOMEN: Appears non-distended. No obvious abdominal masses. EXTREMITIES: No clubbing or cyanosis. Trace lower extremity edema.  Distal pedal pulses are 2+ bilaterally.   Assessment and Plan:   1. Preoperative Cardiac Clearance for Hernia Repair  - As discussed during prior visits and today, ideally he would not hold Plavix until 6 months out from intervention but he is in significant pain from his hernia and is also concerned about requiring an emergent procedure if this were to become incarcerated. At this time, he is aware of the increased risk of stent thrombosis and wishes to proceed with surgery instead of postponing this until 09/2022. I did review with Dr. Diona Browner who previously reviewed the patient with Dr. Lynnette Caffey and Dr. Corliss Skains. It was recommended he should be on ASA  daily while off Plavix for 5 days. Will also review with  Pharmacy in regards to holding Eliquis but would need to be held for at least 2 days. The patient reports they are planning to observe him overnight following his surgery. He denies any anginal symptoms and does not require further cardiac testing prior to surgery. Will send today's note to Dr. Corliss Skains.   2. CAD - He is s/p NSTEMI in 03/2022 with DESx2 to RCA and staged orbital atherectomy/DES placement to proximal LAD. He denies any recent anginal symptoms.  - Continue Plavix 75mg  daily (recommended to be on this for 6 months), Atorvastatin 40mg  daily and Toprol-XL 100mg  BID.   3. Permanent Atrial Fibrillation - He denies any recent palpitations and HR is well-controlled in the 80's today. Continue Toprol-XL for rate-control.  - No reports of active bleeding. Continue Eliquis 5mg  BID for anticoagulation.   4. HTN - His BP is well-controlled at 116/68 during  today's visit. He has made several dietary changes with associated weight loss, therefore will not adjust medical therapy today. Continue Toprol-XL 100mg  BID.   5. HLD - Followed by his PCP. Remains on Atorvastatin 40mg  daily with goal LDL < 55.  6. OSA - Continued compliance with CPAP encouraged.   Signed, Ellsworth Lennox, PA-C

## 2022-07-13 ENCOUNTER — Telehealth: Payer: Self-pay | Admitting: Pharmacist

## 2022-07-13 NOTE — Telephone Encounter (Signed)
Patient with diagnosis of afib on Eliquis for anticoagulation.    Procedure: inguinal hernia repair Date of procedure: TBD  CHA2DS2-VASc Score = 5  This indicates a 7.2% annual risk of stroke. The patient's score is based upon: CHF History: 1 HTN History: 1 Diabetes History: 1 Stroke History: 0 Vascular Disease History: 1 Age Score: 1 Gender Score: 0  CrCl 75mL/min Platelet count 233K  Per office protocol, patient can hold Eliquis for 2 days prior to procedure.    **This guidance is not considered finalized until pre-operative APP has relayed final recommendations.**

## 2022-07-13 NOTE — Telephone Encounter (Signed)
I called and left message for surgery scheduler to fax clearance over to [phone] attn: pre op team. Once I have the request I will get into the correct format for preop team.

## 2022-07-13 NOTE — Telephone Encounter (Signed)
-----   Message from Ellsworth Lennox, [place] sent at 07/12/2022  7:59 PM EDT ----- Regarding: Holding Eliquis Good evening,   I saw this patient in clinic today and he is going to be having inguinal hernia repair in the coming months. I was hoping you could review his chart and let me know if he needs to hold Eliquis for 2 or 3 days prior to his procedure. Thanks for your help!  Best,  Randall An

## 2022-07-13 NOTE — Telephone Encounter (Signed)
Thank you [place]. Megan S. Pharm-d asked for me to put get a formal request. I will update her. Thank you for the help.

## 2022-07-13 NOTE — Telephone Encounter (Signed)
Surgeon's office called back and stated they will fax clearance to [phone]. In between my call and the return call [name], had stated that she had cleared the pt and had sent her notes yesterday. I will update the surgeon's office thanking them for their time as well but request no longer needed as [facility] yesterday faxed over her ov notes providing clearance.

## 2022-07-26 DIAGNOSIS — E1122 Type 2 diabetes mellitus with diabetic chronic kidney disease: Secondary | ICD-10-CM | POA: Diagnosis not present

## 2022-07-26 DIAGNOSIS — I4821 Permanent atrial fibrillation: Secondary | ICD-10-CM | POA: Diagnosis not present

## 2022-07-26 DIAGNOSIS — I5032 Chronic diastolic (congestive) heart failure: Secondary | ICD-10-CM | POA: Diagnosis not present

## 2022-07-26 DIAGNOSIS — I2510 Atherosclerotic heart disease of native coronary artery without angina pectoris: Secondary | ICD-10-CM | POA: Diagnosis not present

## 2022-08-08 ENCOUNTER — Other Ambulatory Visit (HOSPITAL_COMMUNITY): Payer: Medicare Other

## 2022-08-09 ENCOUNTER — Encounter (HOSPITAL_COMMUNITY): Payer: Self-pay

## 2022-08-09 NOTE — Pre-Procedure Instructions (Signed)
Surgical Instructions    Your procedure is scheduled on Thursday, May 23rd.  Report to [facility] Main Entrance "A" at 05:30 A.M., then check in with the Admitting office.  Call this number if you have problems the morning of surgery:  [phone]  If you have any questions prior to your surgery date call [phone]: Open Monday-Friday 8am-4pm If you experience any cold or flu symptoms such as cough, fever, chills, shortness of breath, etc. between now and your scheduled surgery, please notify us at the above number.     Remember:  Do not eat after midnight the night before your surgery  You may drink clear liquids until 04:30 AM the morning of your surgery.   Clear liquids allowed are: Water, Non-Citrus Juices (without pulp), Carbonated Beverages, Clear Tea, Black Coffee Only (NO MILK, CREAM OR POWDERED CREAMER of any kind), and Gatorade.   Patient Instructions  The night before surgery:  No food after midnight. ONLY clear liquids after midnight   The day of surgery (if you have diabetes): Drink ONE (1) 12 oz G2 given to you in your pre admission testing appointment by 04:30 AM the morning of surgery. Drink in one sitting. Do not sip.  This drink was given to you during your hospital  pre-op appointment visit.  Nothing else to drink after completing the  12 oz bottle of G2.         If you have questions, please contact your surgeon's office.     Take these medicines the morning of surgery with A SIP OF WATER  atorvastatin (LIPITOR)  metoprolol succinate (TOPROL-XL)    If needed: acetaminophen (TYLENOL)  fluticasone (FLONASE)  HYDROcodone-acetaminophen (NORCO)    Hold Plavix 5 days prior to surgery. Last dose 5/17. Hold Eliquis for 2 days prior to surgery. Last dose 5/20.  As of today, STOP taking any Aspirin (unless otherwise instructed by your surgeon) Aleve, Naproxen, Ibuprofen, Motrin, Advil, Goody's, BC's, all herbal medications, fish oil, and all  vitamins.  WHAT DO I DO ABOUT MY DIABETES MEDICATION?   Do not take metFORMIN (GLUCOPHAGE) the morning of surgery.  Hold empagliflozin (JARDIANCE) for 72 hours prior to surgery. Last dose 5/19.        HOW TO MANAGE YOUR DIABETES BEFORE AND AFTER SURGERY  Why is it important to control my blood sugar before and after surgery? Improving blood sugar levels before and after surgery helps healing and can limit problems. A way of improving blood sugar control is eating a healthy diet by:  Eating less sugar and carbohydrates  Increasing activity/exercise  Talking with your doctor about reaching your blood sugar goals High blood sugars (greater than 180 mg/dL) can raise your risk of infections and slow your recovery, so you will need to focus on controlling your diabetes during the weeks before surgery. Make sure that the doctor who takes care of your diabetes knows about your planned surgery including the date and location.  How do I manage my blood sugar before surgery? Check your blood sugar at least 4 times a day, starting 2 days before surgery, to make sure that the level is not too high or low.  Check your blood sugar the morning of your surgery when you wake up and every 2 hours until you get to the Short Stay unit.  If your blood sugar is less than 70 mg/dL, you will need to treat for low blood sugar: Do not take insulin. Treat a low blood sugar (less than 70  mg/dL) with  cup of clear juice (cranberry or apple), 4 glucose tablets, OR glucose gel. Recheck blood sugar in 15 minutes after treatment (to make sure it is greater than 70 mg/dL). If your blood sugar is not greater than 70 mg/dL on recheck, call [phone] for further instructions. Report your blood sugar to the short stay nurse when you get to Short Stay.  If you are admitted to the hospital after surgery: Your blood sugar will be checked by the staff and you will probably be given insulin after surgery (instead of oral  diabetes medicines) to make sure you have good blood sugar levels. The goal for blood sugar control after surgery is 80-180 mg/dL.                     Do NOT Smoke (Tobacco/Vaping) for 24 hours prior to your procedure.  If you use a CPAP at night, you may bring your mask/headgear for your overnight stay.   Contacts, glasses, piercing's, hearing aid's, dentures or partials may not be worn into surgery, please bring cases for these belongings.    For patients admitted to the hospital, discharge time will be determined by your treatment team.   Patients discharged the day of surgery will not be allowed to drive home, and someone needs to stay with them for 24 hours.  SURGICAL WAITING ROOM VISITATION Patients having surgery or a procedure may have no more than 2 support people in the waiting area - these visitors may rotate.   Children under the age of 34 must have an adult with them who is not the patient. If the patient needs to stay at the hospital during part of their recovery, the visitor guidelines for inpatient rooms apply. Pre-op nurse will coordinate an appropriate time for 1 support person to accompany patient in pre-op.  This support person may not rotate.   Please refer to the [facility] website for the visitor guidelines for Inpatients (after your surgery is over and you are in a regular room).    Special instructions:   [facility]- Preparing For Surgery  Before surgery, you can play an important role. Because skin is not sterile, your skin needs to be as free of germs as possible. You can reduce the number of germs on your skin by washing with CHG (chlorahexidine gluconate) Soap before surgery.  CHG is an antiseptic cleaner which kills germs and bonds with the skin to continue killing germs even after washing.    Oral Hygiene is also important to reduce your risk of infection.  Remember - BRUSH YOUR TEETH THE MORNING OF SURGERY WITH YOUR REGULAR TOOTHPASTE  Please do not use  if you have an allergy to CHG or antibacterial soaps. If your skin becomes reddened/irritated stop using the CHG.  Do not shave (including legs and underarms) for at least 48 hours prior to first CHG shower. It is OK to shave your face.  Please follow these instructions carefully.   Shower the NIGHT BEFORE SURGERY and the MORNING OF SURGERY  If you chose to wash your hair, wash your hair first as usual with your normal shampoo.  After you shampoo, rinse your hair and body thoroughly to remove the shampoo.  Use CHG Soap as you would any other liquid soap. You can apply CHG directly to the skin and wash gently with a scrungie or a clean washcloth.   Apply the CHG Soap to your body ONLY FROM THE NECK DOWN.  Do not  use on open wounds or open sores. Avoid contact with your eyes, ears, mouth and genitals (private parts). Wash Face and genitals (private parts)  with your normal soap.   Wash thoroughly, paying special attention to the area where your surgery will be performed.  Thoroughly rinse your body with warm water from the neck down.  DO NOT shower/wash with your normal soap after using and rinsing off the CHG Soap.  Pat yourself dry with a CLEAN TOWEL.  Wear CLEAN PAJAMAS to bed the night before surgery  Place CLEAN SHEETS on your bed the night before your surgery  DO NOT SLEEP WITH PETS.   Day of Surgery: Take a shower with CHG soap. Do not wear jewelry  Do not wear lotions, powders, colognes, or deodorant. Men may shave face and neck. Do not bring valuables to the hospital. [facility] is not responsible for any belongings or valuables. Wear Clean/Comfortable clothing the morning of surgery Remember to brush your teeth WITH YOUR REGULAR TOOTHPASTE.   Please read over the following fact sheets that you were given.    If you received a COVID test during your pre-op visit  it is requested that you wear a mask when out in public, stay away from anyone that may not be feeling  well and notify your surgeon if you develop symptoms. If you have been in contact with anyone that has tested positive in the last 10 days please notify you surgeon.

## 2022-08-10 ENCOUNTER — Encounter (HOSPITAL_COMMUNITY)
Admission: RE | Admit: 2022-08-10 | Discharge: 2022-08-10 | Disposition: A | Discharge status: Home / Self Care | Payer: Medicare Other | Source: Ambulatory Visit | Attending: Surgery | Admitting: Surgery

## 2022-08-10 ENCOUNTER — Encounter (HOSPITAL_COMMUNITY): Payer: Self-pay

## 2022-08-10 ENCOUNTER — Other Ambulatory Visit: Payer: Self-pay

## 2022-08-10 VITALS — BP 106/64 | HR 84 | Temp 98.6°F | Resp 18 | Ht 70.0 in | Wt 203.0 lb

## 2022-08-10 DIAGNOSIS — I4891 Unspecified atrial fibrillation: Secondary | ICD-10-CM | POA: Insufficient documentation

## 2022-08-10 DIAGNOSIS — G4733 Obstructive sleep apnea (adult) (pediatric): Secondary | ICD-10-CM | POA: Diagnosis not present

## 2022-08-10 DIAGNOSIS — I700 Atherosclerosis of aorta: Secondary | ICD-10-CM | POA: Diagnosis not present

## 2022-08-10 DIAGNOSIS — Z7901 Long term (current) use of anticoagulants: Secondary | ICD-10-CM | POA: Diagnosis not present

## 2022-08-10 DIAGNOSIS — E119 Type 2 diabetes mellitus without complications: Secondary | ICD-10-CM | POA: Diagnosis not present

## 2022-08-10 DIAGNOSIS — K4020 Bilateral inguinal hernia, without obstruction or gangrene, not specified as recurrent: Secondary | ICD-10-CM | POA: Insufficient documentation

## 2022-08-10 DIAGNOSIS — I110 Hypertensive heart disease with heart failure: Secondary | ICD-10-CM | POA: Insufficient documentation

## 2022-08-10 DIAGNOSIS — Z7902 Long term (current) use of antithrombotics/antiplatelets: Secondary | ICD-10-CM | POA: Diagnosis not present

## 2022-08-10 DIAGNOSIS — N400 Enlarged prostate without lower urinary tract symptoms: Secondary | ICD-10-CM | POA: Diagnosis not present

## 2022-08-10 DIAGNOSIS — Z01812 Encounter for preprocedural laboratory examination: Secondary | ICD-10-CM | POA: Insufficient documentation

## 2022-08-10 DIAGNOSIS — I4510 Unspecified right bundle-branch block: Secondary | ICD-10-CM | POA: Diagnosis not present

## 2022-08-10 DIAGNOSIS — I2510 Atherosclerotic heart disease of native coronary artery without angina pectoris: Secondary | ICD-10-CM | POA: Insufficient documentation

## 2022-08-10 DIAGNOSIS — Z7982 Long term (current) use of aspirin: Secondary | ICD-10-CM | POA: Insufficient documentation

## 2022-08-10 DIAGNOSIS — Z79899 Other long term (current) drug therapy: Secondary | ICD-10-CM | POA: Insufficient documentation

## 2022-08-10 DIAGNOSIS — I5031 Acute diastolic (congestive) heart failure: Secondary | ICD-10-CM | POA: Diagnosis not present

## 2022-08-10 DIAGNOSIS — Z955 Presence of coronary angioplasty implant and graft: Secondary | ICD-10-CM | POA: Insufficient documentation

## 2022-08-10 DIAGNOSIS — I252 Old myocardial infarction: Secondary | ICD-10-CM | POA: Diagnosis not present

## 2022-08-10 DIAGNOSIS — Z01818 Encounter for other preprocedural examination: Secondary | ICD-10-CM

## 2022-08-10 HISTORY — DX: Type 2 diabetes mellitus without complications: E11.9

## 2022-08-10 HISTORY — DX: Atherosclerotic heart disease of native coronary artery without angina pectoris: I25.10

## 2022-08-10 HISTORY — DX: Personal history of urinary calculi: Z87.442

## 2022-08-10 HISTORY — DX: Bilateral inguinal hernia, without obstruction or gangrene, not specified as recurrent: K40.20

## 2022-08-10 LAB — BASIC METABOLIC PANEL
Anion gap: 6 (ref 5–15)
BUN: 21 mg/dL (ref 8–23)
CO2: 28 mmol/L (ref 22–32)
Calcium: 9 mg/dL (ref 8.9–10.3)
Chloride: 103 mmol/L (ref 98–111)
Creatinine, Ser: 0.99 mg/dL (ref 0.61–1.24)
GFR, Estimated: >60 mL/min (ref >60)
Glucose, Bld: 120 mg/dL — ABNORMAL HIGH (ref 70–99)
Potassium: 4.5 mmol/L (ref 3.5–5.1)
Sodium: 137 mmol/L (ref 135–145)

## 2022-08-10 LAB — CBC
HCT: 40.5 % (ref 39.0–52.0)
Hemoglobin: 13.1 g/dL (ref 13.0–17.0)
MCH: 28.7 pg (ref 26.0–34.0)
MCHC: 32.3 g/dL (ref 30.0–36.0)
MCV: 88.6 fL (ref 80.0–100.0)
Platelets: 166 10*3/uL (ref 150–400)
RBC: 4.57 MIL/uL (ref 4.22–5.81)
RDW: 14.1 % (ref 11.5–15.5)
WBC: 7.6 10*3/uL (ref 4.0–10.5)
nRBC: 0 % (ref 0.0–0.2)

## 2022-08-10 LAB — HEMOGLOBIN A1C
Hgb A1c MFr Bld: 6 % — ABNORMAL HIGH (ref 4.8–5.6)
Mean Plasma Glucose: 125.5 mg/dL

## 2022-08-10 LAB — GLUCOSE, CAPILLARY: Glucose-Capillary: 132 mg/dL — ABNORMAL HIGH (ref 70–99)

## 2022-08-10 NOTE — Progress Notes (Signed)
PCP - Dr. Carylon Perches Cardiologist - Dr. Nona Dell  PPM/ICD - denies   Chest x-ray - 03/28/22 EKG - 07/04/22 Stress Test - denies ECHO - 03/29/22 Cardiac Cath - 03/31/22  Sleep Study - OSA+ CPAP - denies, Pt states he stopped using a CPAP about a month ago because he has lost about 40 pounds and no longer has issues with OSA  Fasting Blood Sugar - 90-130 Checks Blood Sugar continuously (sensor)  Last dose of GLP1 agonist-  n/a   ASA/Blood Thinner Instructions: Pt was instructed to hold Eliquis for 2 days. He was told to hold Plavix for 5 days and to take ASA 81 mg during the 5 days that Plavix is being held   ERAS Protcol - yes PRE-SURGERY G2- given at PAT  COVID TEST- n/a   Anesthesia review: yes, cardiac hx  Patient denies shortness of breath, fever, cough and chest pain at PAT appointment   All instructions explained to the patient, with a verbal understanding of the material. Patient agrees to go over the instructions while at home for a better understanding. The opportunity to ask questions was provided.

## 2022-08-11 ENCOUNTER — Encounter (HOSPITAL_COMMUNITY): Payer: Self-pay

## 2022-08-11 NOTE — Anesthesia Preprocedure Evaluation (Signed)
Anesthesia Evaluation  Patient identified by MRN, date of birth, ID band Patient awake    Reviewed: Allergy & Precautions, NPO status , Patient's Chart, lab work & pertinent test results, reviewed documented beta blocker date and time   Airway Mallampati: II  TM Distance: >3 FB     Dental no notable dental hx. (+) Dental Advisory Given   Pulmonary sleep apnea and Continuous Positive Airway Pressure Ventilation    Pulmonary exam normal breath sounds clear to auscultation       Cardiovascular hypertension, Pt. on medications and Pt. on home beta blockers + CAD, + Past MI, + Cardiac Stents and +CHF   Rhythm:Irregular Rate:Normal  NSTEMI 03/28/22 S/P DES x 3 RCA  EKG 07/04/22 Atrial fibrillation, incomplete RBBB pattern   Neuro/Psych negative neurological ROS  negative psych ROS   GI/Hepatic negative GI ROS, Neg liver ROS,,,  Endo/Other  diabetes, Well Controlled, Type 2, Oral Hypoglycemic Agents    Renal/GU Renal diseaseHx/o renal calculi   BPH    Musculoskeletal  (+) Arthritis , Osteoarthritis,  Spondylosis without myelopathy cervical spine Bilateral inguinal hernias   Abdominal   Peds  Hematology Plavix therapy- last dose 5 days ago Eliquis therapy- last dose 2 days ago   Anesthesia Other Findings   Reproductive/Obstetrics Testicular hypofunction                              Anesthesia Physical Anesthesia Plan  ASA: 3  Anesthesia Plan: General   Post-op Pain Management: Regional block* and Minimal or no pain anticipated   Induction: Intravenous  PONV Risk Score and Plan: 4 or greater and Treatment may vary due to age or medical condition and Ondansetron  Airway Management Planned: Oral ETT and LMA  Additional Equipment: None  Intra-op Plan:   Post-operative Plan: Extubation in OR  Informed Consent: I have reviewed the patients History and Physical, chart, labs and discussed  the procedure including the risks, benefits and alternatives for the proposed anesthesia with the patient or authorized representative who has indicated his/her understanding and acceptance.     Dental advisory given  Plan Discussed with: Anesthesiologist and CRNA  Anesthesia Plan Comments: (See PAT note written 08/11/2022 by Shonna Chock, PA-C.  Per 07/12/22 Progress note by Randall An, PA-C,  "Preoperative Cardiac Clearance for Hernia Repair  - As discussed during prior visits and today, ideally he would not hold Plavix until 6 months out from intervention but he is in significant pain from his hernia and is also concerned about requiring an emergent procedure if this were to become incarcerated. At this time, he is aware of the increased risk of stent thrombosis and wishes to proceed with surgery instead of postponing this until 09/2022. I did review with Dr. Diona Browner who previously reviewed the patient with Dr. Lynnette Caffey and Dr. Corliss Skains. It was recommended he should be on ASA 81mg  daily while off Plavix for 5 days. Will also review with Pharmacy in regards to holding Eliquis but would need to be held for at least 2 days. The patient reports they are planning to observe him overnight following his surgery. He denies any anginal symptoms and does not require further cardiac testing prior to surgery. Will send today's note to Dr. 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  )        Anesthesia Quick Evaluation

## 2022-08-11 NOTE — Progress Notes (Signed)
Anesthesia Chart Review:  Case: [id number] Date/Time: 08/17/22 0715   Procedure: OPEN BILATERAL INGUINAL HERNIA REPAIRS WITH MESH (Bilateral) - GEN AND TAP BLOCK   Anesthesia type: General   Pre-op diagnosis: BILATERAL INGUINAL HERNIAS   [facility] ROOM 04 / [facility]   Surgeons: Manus Rudd, MD       DISCUSSION: Patient is a 77 year old male scheduled for the above procedure.  History includes never smoker, HTN, DM2, afib, CAD (NSTEMI, s/p staged DES x2 RCA 03/30/22, DES LAD 03/31/22), OSA (uses CPAP), BPH.   [facility] admission 03/28/22-04/04/22 for hypotension and LE edema and ruled in for NSTEMI (peak troponin 3628) and AKI (Creatinine 2.71). BNP also elevated at 531. Known afib, but with RVR at 113 bpm. He was started on IV heparin.He had fallen and injured his right knee in December. LE venous US negative for DVT. H 03/29/22 echo showed LVEF 50-55%, cannot exclude mild inferior mid-basal hypokinesis, mildly reduced RVSF, RVSP 33.5 mmHg, mild-moderate MR. He was diureses for acute diastolic CHF. His renal function improved and underwent staged PCI, s/p overlapping DES x2 to RCA 03/30/22, s/p orbital atherectomy DES proximal LAD on 04/01/23. Medical therapy for residual high grade, highly calcified OM. Triple therapy with Plavix, ASA, Eliquis x 4 weeks, then Plavix and Eliquis for 6 months, then Eliquis monotherapy planned.   He was evaluated by Dr. Corliss Skains with general surgery in April after he developed increasing pain in known bilateral inguinal hernias. 06/19/22 CT Abd/pelvis showed mildly dilated loop of small bowel within a moderate sized right inguinal hernia which was previously only fat containing. Hernias have been reducible. He attempted to wear a truss over the area with limited success. He is concerned his right hernia will become incarcerated. Given DES x3 placed in January, cardiology input requested. There has been conversations between cardiology, Arthur Wilson, and the surgeon. Per 07/12/22  Progress note by Randall An, PA-C,  "Preoperative Cardiac Clearance for Hernia Repair  - As discussed during prior visits and today, ideally he would not hold Plavix until 6 months out from intervention but he is in significant pain from his hernia and is also concerned about requiring an emergent procedure if this were to become incarcerated. At this time, he is aware of the increased risk of stent thrombosis and wishes to proceed with surgery instead of postponing this until 09/2022. I did review with Dr. Diona Browner who previously reviewed the patient with Dr. Lynnette Caffey and Dr. Corliss Skains. It was recommended he should be on ASA 81mg  daily while off Plavix for 5 days. Will also review with Pharmacy in regards to holding Eliquis but would need to be held for at least 2 days. The patient reports they are planning to observe him overnight following his surgery. He denies any anginal symptoms and does not require further cardiac testing prior to surgery. Will send today's note to Dr. 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   08/10/22 labs acceptable for OR. Creatinine 0.99. CBC normal. A1c 6.0%.  Anesthesia team to evaluate on the day of surgery.    VS: BP 106/64   Pulse 84   Temp 37 C   Resp 18   Ht 5\' 10"  (1.778 m)   Wt 92.1 kg   SpO2 97%   BMI 29.13 kg/m    PROVIDERS: Ouida Sills,  Channing Mutters, MD is PCP  Nona Dell, MD is cardiologist   LABS: Labs reviewed: Acceptable for surgery. (all labs ordered are listed, but only abnormal results are displayed)  Labs Reviewed  GLUCOSE, CAPILLARY - Abnormal; Notable for the following components:      Result Value   Glucose-Capillary 132 (*)    All other components within normal limits  HEMOGLOBIN A1C - Abnormal; Notable for the following components:    Hgb A1c MFr Bld 6.0 (*)    All other components within normal limits  BASIC METABOLIC PANEL - Abnormal; Notable for the following components:   Glucose, Bld 120 (*)    All other components within normal limits  CBC    IMAGES: CT Abd/pelvis 06/19/2022: IMPRESSION: 1. A mildly dilated loop of small bowel is seen within a moderate sized right inguinal hernia, previously only fat containing. No upstream dilation of the small bowel. Correlate clinically for reducibility. 2. Small solid pulmonary nodule of the left lower lobe measuring 5 mm. No follow-up needed if patient is low-risk.This recommendation follows the consensus statement: Guidelines for Management of Incidental Pulmonary Nodules Detected on CT Images: From the Fleischner Society 2017; Radiology 2017; 284:228-243. 3. Aortic Atherosclerosis (ICD10-I70.0).   CXR 03/28/2022: FINDINGS: Mild cardiomegaly. No focal pulmonary opacity. No pleural effusion or pneumothorax. No acute osseous abnormality. IMPRESSION: Cardiomegaly with no additional acute cardiopulmonary process.     EKG: EKG 07/04/2022: Atrial fibrillation at 70 bpm.  Incomplete right bundle branch block.   CV: Cardiac catheterization/staged PCI 03/31/2022:   Prox LAD lesion is 75% stenosed.   1st Mrg lesion is 90% stenosed.   Non-stenotic Ost RCA to Prox RCA lesion was previously treated.   Non-stenotic Prox RCA lesion was previously treated.   A stent was successfully placed.   Post intervention, there is a 0% residual stenosis.   1.  Successful PCI of proximal LAD aided by orbital atherectomy with OCT optimization. [Balloon angioplasty with a 3.0 mm balloon which showed good yielding of the diseased area. We then placed a 4.0 x 32 mm Synergy XD stent that was postdilated to high pressures with a 4.0 mm Aragon balloon.] 2.  Residual high-grade and highly calcified lesion of the obtuse marginal; given the relatively small territory subtended by this vessel and that the  patient's symptoms are dyspnea rather than angina, after discussion with Dr. Anne Fu medical therapy will be pursued for this burden of disease. 3.  LVEDP of 10 mmHg but obscured by widely fluctuating RR intervals due to atrial fibrillation. Recommendation: Triple therapy for ideally 4 weeks then Plavix and Eliquis for 6 months in total then Eliquis monotherapy thereafter.   Cardiac catheterization 03/30/2022:   Prox LAD lesion is 75% stenosed.   1st Mrg lesion is 90% stenosed.   Ost RCA to Prox RCA lesion is 60% stenosed.   Prox RCA lesion is 70% stenosed.   A stent was successfully placed.   Post intervention, there is a 0% residual stenosis.   Post intervention, there is a 0% residual stenosis.   1.  High-grade disease of proximal LAD and obtuse marginal with RFR positive ostial and proximal to mid right coronary artery disease. 2.  After review with Dr. Anne Fu, PCI of the right coronary artery was pursued with 2 overlapping drug-eluting stents placed. 3.  Due to dye expenditure, PCI of the proximal LAD and left circumflex will be staged depending on renal function. 4.  LVEDP of 9 mmHg. 5.  Fick cardiac output of 5.5 L/min and  Fick cardiac index of 2.4 L/min/m with a mean RA pressure of 11 mmHg, mean wedge pressure of 18 mmHg, and mean PA pressure of 30 mmHg. 6.  Very difficult cannulation of right coronary artery; please see procedural details.   Recommendation: Given history of atrial fibrillation the patient will be maintained on triple therapy for 1 month and then Plavix and Eliquis for 6 months in total and then Eliquis indefinitely.  Depending on the patient's creatinine staged PCI of the LAD and left circumflex will be pursued.    Echocardiogram 03/29/2022: IMPRESSIONS   1. Left ventricular ejection fraction, by estimation, is 50 to 55%. The  left ventricle has low normal function. The left ventricle demonstrates  regional wall motion abnormalities - cannot exclude mild inferior mid  to  basal hypokinesis. There is mild  left ventricular hypertrophy. Left ventricular diastolic parameters are  indeterminate.   2. Right ventricular systolic function is mildly reduced. The right  ventricular size is normal. There is normal pulmonary artery systolic  pressure. The estimated right ventricular systolic pressure is 33.5 mmHg.   3. Left atrial size was moderately dilated.   4. Right atrial size was mildly dilated.   5. The mitral valve is grossly normal. Mild to moderate mitral valve  regurgitation.   6. The aortic valve is tricuspid. There is mild calcification of the  aortic valve. Aortic valve regurgitation is not visualized.   7. The inferior vena cava is normal in size with greater than 50%  respiratory variability, suggesting right atrial pressure of 3 mmHg.   Comparison(s): Prior images reviewed side by side. LVEF low normal at  50-55% with possible mild, mid to basal inferior hypokinesis. Mild RV  dysfunction with upper normal estimated RVSP.    Venous US BLE 03/29/2022: IMPRESSION: Negative for DVT in the bilateral lower extremities    Past Medical History:  Diagnosis Date   Atrial fibrillation (HCC)    BPH (benign prostatic hyperplasia)    Cervical spondylosis without myelopathy    Diabetes mellitus without complication (HCC)    Essential hypertension    History of kidney stones    showed on CT scan   Impaired glucose tolerance    Inguinal hernia bilateral, non-recurrent    Myocardial infarction (HCC) 03/30/2022   OSA on CPAP    Testicular hypofunction     Past Surgical History:  Procedure Laterality Date   CORONARY ATHERECTOMY N/A 03/31/2022   Procedure: CORONARY ATHERECTOMY;  Surgeon: Orbie Pyo, MD;  Location: [facility] INVASIVE CV LAB;  Service: Cardiovascular;  Laterality: N/A;   CORONARY PRESSURE/FFR STUDY N/A 03/30/2022   Procedure: INTRAVASCULAR PRESSURE WIRE/FFR STUDY;  Surgeon: Orbie Pyo, MD;  Location: [facility] INVASIVE CV LAB;  Service:  Cardiovascular;  Laterality: N/A;   CORONARY STENT INTERVENTION N/A 03/30/2022   Procedure: CORONARY STENT INTERVENTION;  Surgeon: Orbie Pyo, MD;  Location: [facility];  Service: Cardiovascular;  Laterality: N/A;   CORONARY STENT INTERVENTION N/A 03/31/2022   Procedure: CORONARY STENT INTERVENTION;  Surgeon: Orbie Pyo, MD;  Location: [facility] INVAS[facility] CV LAB;  Service: Cardiovascular;  Laterality: N/A;   FOOT SURGERY Right 01/10/2016   Bunion removal   KNEE SURGERY Left 1992   removal of ACL from ski fall   RIGHT/LEFT HEART CATH AND CORONARY ANGIOGRAPHY N/A 03/30/2022   Procedure: RIGHT/LEFT HEART CATH AND CORONARY ANGIOGRAPHY;  Surgeon: Orbie Pyo, MD;  Location: [facility] CV LAB;  Service: Cardiovascular;  Laterality: N/A;    MEDICATIONS:  acetaminophen (TYLENOL) 500 MG tablet   ALPRAZolam (XANAX) 0.5 MG tablet   atorvastatin (LIPITOR) 40 MG tablet   clopidogrel (PLAVIX) 75 MG tablet   Continuous Blood Gluc Sensor (DEXCOM G7 SENSOR) MISC   ELIQUIS 5 MG TABS tablet   empagliflozin (JARDIANCE) 10 MG TABS tablet   fluticasone (FLONASE) 50 MCG/ACT nasal spray   furosemide (LASIX) 40 MG tablet   HYDROcodone-acetaminophen (NORCO) 7.5-325 MG tablet   metFORMIN (GLUCOPHAGE) 500 MG tablet   metoprolol succinate (TOPROL-XL) 100 MG 24 hr tablet   pantoprazole (PROTONIX) 40 MG tablet   No current facility-administered medications for this encounter.    Shonna Chock, PA-C Surgical Short Stay/Anesthesiology [facility] Phone [phone] [facility] Phone [phone] 08/11/2022 11:36 AM

## 2022-08-16 ENCOUNTER — Encounter (HOSPITAL_COMMUNITY): Payer: Self-pay | Admitting: Surgery

## 2022-08-17 ENCOUNTER — Ambulatory Visit (HOSPITAL_COMMUNITY)
Admission: RE | Admit: 2022-08-17 | Discharge: 2022-08-17 | Disposition: A | Discharge status: Home / Self Care | Payer: Medicare Other | Attending: Surgery | Admitting: Surgery

## 2022-08-17 ENCOUNTER — Encounter (HOSPITAL_COMMUNITY)
Admission: RE | Disposition: A | Discharge status: Home / Self Care | Payer: Self-pay | Source: Home / Self Care | Attending: Surgery

## 2022-08-17 ENCOUNTER — Other Ambulatory Visit: Payer: Self-pay

## 2022-08-17 ENCOUNTER — Ambulatory Visit (HOSPITAL_BASED_OUTPATIENT_CLINIC_OR_DEPARTMENT_OTHER): Payer: Medicare Other | Admitting: General Practice

## 2022-08-17 ENCOUNTER — Encounter (HOSPITAL_COMMUNITY): Payer: Self-pay | Admitting: Surgery

## 2022-08-17 ENCOUNTER — Ambulatory Visit (HOSPITAL_COMMUNITY): Payer: Medicare Other | Admitting: Vascular Surgery

## 2022-08-17 DIAGNOSIS — I2510 Atherosclerotic heart disease of native coronary artery without angina pectoris: Secondary | ICD-10-CM | POA: Insufficient documentation

## 2022-08-17 DIAGNOSIS — M1990 Unspecified osteoarthritis, unspecified site: Secondary | ICD-10-CM | POA: Insufficient documentation

## 2022-08-17 DIAGNOSIS — I4891 Unspecified atrial fibrillation: Secondary | ICD-10-CM | POA: Insufficient documentation

## 2022-08-17 DIAGNOSIS — I509 Heart failure, unspecified: Secondary | ICD-10-CM | POA: Insufficient documentation

## 2022-08-17 DIAGNOSIS — M7989 Other specified soft tissue disorders: Secondary | ICD-10-CM | POA: Diagnosis not present

## 2022-08-17 DIAGNOSIS — E119 Type 2 diabetes mellitus without complications: Secondary | ICD-10-CM | POA: Insufficient documentation

## 2022-08-17 DIAGNOSIS — I10 Essential (primary) hypertension: Secondary | ICD-10-CM | POA: Diagnosis not present

## 2022-08-17 DIAGNOSIS — Z955 Presence of coronary angioplasty implant and graft: Secondary | ICD-10-CM | POA: Diagnosis not present

## 2022-08-17 DIAGNOSIS — K4020 Bilateral inguinal hernia, without obstruction or gangrene, not specified as recurrent: Secondary | ICD-10-CM | POA: Insufficient documentation

## 2022-08-17 DIAGNOSIS — I110 Hypertensive heart disease with heart failure: Secondary | ICD-10-CM | POA: Diagnosis not present

## 2022-08-17 DIAGNOSIS — Z7984 Long term (current) use of oral hypoglycemic drugs: Secondary | ICD-10-CM | POA: Diagnosis not present

## 2022-08-17 DIAGNOSIS — Z79899 Other long term (current) drug therapy: Secondary | ICD-10-CM | POA: Insufficient documentation

## 2022-08-17 DIAGNOSIS — I252 Old myocardial infarction: Secondary | ICD-10-CM | POA: Insufficient documentation

## 2022-08-17 DIAGNOSIS — Z7901 Long term (current) use of anticoagulants: Secondary | ICD-10-CM | POA: Diagnosis not present

## 2022-08-17 DIAGNOSIS — G8918 Other acute postprocedural pain: Secondary | ICD-10-CM | POA: Diagnosis not present

## 2022-08-17 DIAGNOSIS — G4733 Obstructive sleep apnea (adult) (pediatric): Secondary | ICD-10-CM | POA: Insufficient documentation

## 2022-08-17 DIAGNOSIS — Z7902 Long term (current) use of antithrombotics/antiplatelets: Secondary | ICD-10-CM | POA: Diagnosis not present

## 2022-08-17 DIAGNOSIS — I96 Gangrene, not elsewhere classified: Secondary | ICD-10-CM | POA: Diagnosis not present

## 2022-08-17 HISTORY — PX: INGUINAL HERNIA REPAIR: SHX194

## 2022-08-17 LAB — GLUCOSE, CAPILLARY
Glucose-Capillary: 101 mg/dL — ABNORMAL HIGH (ref 70–99)
Glucose-Capillary: 120 mg/dL — ABNORMAL HIGH (ref 70–99)
Glucose-Capillary: 120 mg/dL — ABNORMAL HIGH (ref 70–99)

## 2022-08-17 SURGERY — REPAIR, HERNIA, INGUINAL, BILATERAL, ADULT
Anesthesia: General | Site: Inguinal | Laterality: Bilateral

## 2022-08-17 MED ORDER — PHENYLEPHRINE 80 MCG/ML (10ML) SYRINGE FOR IV PUSH (FOR BLOOD PRESSURE SUPPORT)
PREFILLED_SYRINGE | INTRAVENOUS | Status: AC
  Filled 2022-08-17: qty 10

## 2022-08-17 MED ORDER — CHLORHEXIDINE GLUCONATE 0.12 % MT SOLN
OROMUCOSAL | Status: AC
  Administered 2022-08-17: 15 mL via OROMUCOSAL
  Filled 2022-08-17: qty 15

## 2022-08-17 MED ORDER — CHLORHEXIDINE GLUCONATE CLOTH 2 % EX PADS: 6.0000 | MEDICATED_PAD | Freq: Once | CUTANEOUS | Status: DC

## 2022-08-17 MED ORDER — PROPOFOL 10 MG/ML IV BOLUS
INTRAVENOUS | Status: AC
  Filled 2022-08-17: qty 20

## 2022-08-17 MED ORDER — ORAL CARE MOUTH RINSE: 15.0000 mL | Freq: Once | OROMUCOSAL | Status: AC

## 2022-08-17 MED ORDER — LIDOCAINE 2% (20 MG/ML) 5 ML SYRINGE
INTRAMUSCULAR | Status: AC
  Filled 2022-08-17: qty 5

## 2022-08-17 MED ORDER — ONDANSETRON HCL 4 MG/2ML IJ SOLN
INTRAMUSCULAR | Status: DC | PRN
  Administered 2022-08-17: 4 mg via INTRAVENOUS

## 2022-08-17 MED ORDER — CEFAZOLIN SODIUM-DEXTROSE 2-4 GM/100ML-% IV SOLN
2.0000 g | INTRAVENOUS | Status: AC
  Administered 2022-08-17: 2 g via INTRAVENOUS

## 2022-08-17 MED ORDER — PROPOFOL 10 MG/ML IV BOLUS
INTRAVENOUS | Status: DC | PRN
  Administered 2022-08-17: 100 mg via INTRAVENOUS

## 2022-08-17 MED ORDER — LIDOCAINE 2% (20 MG/ML) 5 ML SYRINGE
INTRAMUSCULAR | Status: DC | PRN
  Administered 2022-08-17: 80 mg via INTRAVENOUS

## 2022-08-17 MED ORDER — HYDROMORPHONE HCL 1 MG/ML IJ SOLN: 0.2500 mg | INTRAMUSCULAR | Status: DC | PRN

## 2022-08-17 MED ORDER — ACETAMINOPHEN 500 MG PO TABS
ORAL_TABLET | ORAL | Status: AC
  Administered 2022-08-17: 1000 mg via ORAL
  Filled 2022-08-17: qty 2

## 2022-08-17 MED ORDER — FENTANYL CITRATE (PF) 250 MCG/5ML IJ SOLN
INTRAMUSCULAR | Status: DC | PRN
  Administered 2022-08-17 (×3): 50 ug via INTRAVENOUS

## 2022-08-17 MED ORDER — HYDROCODONE-ACETAMINOPHEN 5-325 MG PO TABS
1.0000 | ORAL_TABLET | Freq: Four times a day (QID) | ORAL | 0 refills | Status: DC | PRN
Start: 2022-08-17 — End: 2023-02-14

## 2022-08-17 MED ORDER — LACTATED RINGERS IV SOLN: INTRAVENOUS | Status: DC

## 2022-08-17 MED ORDER — CEFAZOLIN SODIUM-DEXTROSE 2-4 GM/100ML-% IV SOLN
INTRAVENOUS | Status: AC
  Filled 2022-08-17: qty 100

## 2022-08-17 MED ORDER — BUPIVACAINE-EPINEPHRINE (PF) 0.25% -1:200000 IJ SOLN
INTRAMUSCULAR | Status: AC
  Filled 2022-08-17: qty 30

## 2022-08-17 MED ORDER — OXYCODONE HCL 5 MG/5ML PO SOLN: 5.0000 mg | Freq: Once | ORAL | Status: DC | PRN

## 2022-08-17 MED ORDER — BUPIVACAINE HCL (PF) 0.25 % IJ SOLN
INTRAMUSCULAR | Status: DC | PRN
  Administered 2022-08-17: 60 mL

## 2022-08-17 MED ORDER — ROCURONIUM BROMIDE 10 MG/ML (PF) SYRINGE
PREFILLED_SYRINGE | INTRAVENOUS | Status: DC | PRN
  Administered 2022-08-17: 10 mg via INTRAVENOUS
  Administered 2022-08-17: 60 mg via INTRAVENOUS

## 2022-08-17 MED ORDER — PHENYLEPHRINE 80 MCG/ML (10ML) SYRINGE FOR IV PUSH (FOR BLOOD PRESSURE SUPPORT)
PREFILLED_SYRINGE | INTRAVENOUS | Status: DC | PRN
  Administered 2022-08-17 (×2): 80 ug via INTRAVENOUS

## 2022-08-17 MED ORDER — 0.9 % SODIUM CHLORIDE (POUR BTL) OPTIME
TOPICAL | Status: DC | PRN
  Administered 2022-08-17: 1000 mL

## 2022-08-17 MED ORDER — SUGAMMADEX SODIUM 200 MG/2ML IV SOLN
INTRAVENOUS | Status: DC | PRN
  Administered 2022-08-17: 200 mg via INTRAVENOUS

## 2022-08-17 MED ORDER — DEXAMETHASONE SODIUM PHOSPHATE 10 MG/ML IJ SOLN
INTRAMUSCULAR | Status: DC | PRN
  Administered 2022-08-17: 5 mg via INTRAVENOUS

## 2022-08-17 MED ORDER — ONDANSETRON HCL 4 MG/2ML IJ SOLN
INTRAMUSCULAR | Status: AC
  Filled 2022-08-17: qty 2

## 2022-08-17 MED ORDER — OXYCODONE HCL 5 MG PO TABS: 5.0000 mg | ORAL_TABLET | Freq: Once | ORAL | Status: DC | PRN

## 2022-08-17 MED ORDER — BUPIVACAINE LIPOSOME 1.3 % IJ SUSP
INTRAMUSCULAR | Status: DC | PRN
  Administered 2022-08-17: 20 mL

## 2022-08-17 MED ORDER — ACETAMINOPHEN 500 MG PO TABS: 1000.0000 mg | ORAL_TABLET | ORAL | Status: AC

## 2022-08-17 MED ORDER — BUPIVACAINE-EPINEPHRINE 0.25% -1:200000 IJ SOLN
INTRAMUSCULAR | Status: DC | PRN
  Administered 2022-08-17: 20 mL

## 2022-08-17 MED ORDER — FENTANYL CITRATE (PF) 250 MCG/5ML IJ SOLN
INTRAMUSCULAR | Status: AC
  Filled 2022-08-17: qty 5

## 2022-08-17 MED ORDER — BUPIVACAINE LIPOSOME 1.3 % IJ SUSP
INTRAMUSCULAR | Status: AC
  Filled 2022-08-17: qty 20

## 2022-08-17 MED ORDER — DEXAMETHASONE SODIUM PHOSPHATE 10 MG/ML IJ SOLN
INTRAMUSCULAR | Status: AC
  Filled 2022-08-17: qty 1

## 2022-08-17 MED ORDER — CHLORHEXIDINE GLUCONATE 0.12 % MT SOLN: 15.0000 mL | Freq: Once | OROMUCOSAL | Status: AC

## 2022-08-17 MED ORDER — ONDANSETRON HCL 4 MG/2ML IJ SOLN: 4.0000 mg | Freq: Once | INTRAMUSCULAR | Status: DC | PRN

## 2022-08-17 MED ORDER — PHENYLEPHRINE HCL-NACL 20-0.9 MG/250ML-% IV SOLN
INTRAVENOUS | Status: DC | PRN
  Administered 2022-08-17: 40 ug/min via INTRAVENOUS

## 2022-08-17 MED ORDER — ROCURONIUM BROMIDE 10 MG/ML (PF) SYRINGE
PREFILLED_SYRINGE | INTRAVENOUS | Status: AC
  Filled 2022-08-17: qty 10

## 2022-08-17 MED ORDER — INSULIN ASPART 100 UNIT/ML IJ SOLN: 0.0000 [IU] | INTRAMUSCULAR | Status: DC | PRN

## 2022-08-17 SURGICAL SUPPLY — 44 items
APL PRP STRL LF DISP 70% ISPRP (MISCELLANEOUS) ×1
APL SKNCLS STERI-STRIP NONHPOA (GAUZE/BANDAGES/DRESSINGS) ×2
BAG COUNTER SPONGE SURGICOUNT (BAG) ×1 IMPLANT
BAG SPNG CNTER NS LX DISP (BAG) ×1
BENZOIN TINCTURE PRP APPL 2/3 (GAUZE/BANDAGES/DRESSINGS) ×2 IMPLANT
BLADE CLIPPER SURG (BLADE) IMPLANT
CHLORAPREP W/TINT 26 (MISCELLANEOUS) ×1 IMPLANT
COVER SURGICAL LIGHT HANDLE (MISCELLANEOUS) ×1 IMPLANT
DRAIN PENROSE .5X12 LATEX STL (DRAIN) IMPLANT
DRAPE LAPAROTOMY TRNSV 102X78 (DRAPES) IMPLANT
DRSG TEGADERM 4X4.75 (GAUZE/BANDAGES/DRESSINGS) ×2 IMPLANT
ELECT CAUTERY BLADE 6.4 (BLADE) IMPLANT
ELECT REM PT RETURN 9FT ADLT (ELECTROSURGICAL) ×1
ELECTRODE REM PT RTRN 9FT ADLT (ELECTROSURGICAL) ×1 IMPLANT
GAUZE 4X4 16PLY ~~LOC~~+RFID DBL (SPONGE) ×1 IMPLANT
GAUZE SPONGE 4X4 12PLY STRL (GAUZE/BANDAGES/DRESSINGS) ×1 IMPLANT
GLOVE BIO SURGEON STRL SZ7 (GLOVE) ×1 IMPLANT
GLOVE BIOGEL PI IND STRL 7.5 (GLOVE) ×1 IMPLANT
GOWN STRL REUS W/ TWL LRG LVL3 (GOWN DISPOSABLE) ×2 IMPLANT
GOWN STRL REUS W/TWL LRG LVL3 (GOWN DISPOSABLE) ×3
KIT BASIN OR (CUSTOM PROCEDURE TRAY) ×1 IMPLANT
KIT TURNOVER KIT B (KITS) ×1 IMPLANT
MESH PARIETEX PROGRIP LEFT (Mesh General) IMPLANT
MESH PARIETEX PROGRIP RIGHT (Mesh General) IMPLANT
NDL HYPO 25GX1X1/2 BEV (NEEDLE) ×1 IMPLANT
NEEDLE HYPO 25GX1X1/2 BEV (NEEDLE) ×1 IMPLANT
NS IRRIG 1000ML POUR BTL (IV SOLUTION) ×1 IMPLANT
PACK GENERAL/GYN (CUSTOM PROCEDURE TRAY) ×1 IMPLANT
PAD ARMBOARD 7.5X6 YLW CONV (MISCELLANEOUS) ×1 IMPLANT
SPECIMEN JAR SMALL (MISCELLANEOUS) IMPLANT
SPIKE FLUID TRANSFER (MISCELLANEOUS) ×1 IMPLANT
SPONGE INTESTINAL PEANUT (DISPOSABLE) ×1 IMPLANT
STRIP CLOSURE SKIN 1/2X4 (GAUZE/BANDAGES/DRESSINGS) ×2 IMPLANT
SUT MNCRL AB 4-0 PS2 18 (SUTURE) ×2 IMPLANT
SUT SILK 2 0 SH (SUTURE) IMPLANT
SUT SILK 3 0 (SUTURE)
SUT SILK 3-0 18XBRD TIE 12 (SUTURE) IMPLANT
SUT VIC AB 0 CT2 27 (SUTURE) ×2 IMPLANT
SUT VIC AB 2-0 SH 27 (SUTURE) ×2
SUT VIC AB 2-0 SH 27XBRD (SUTURE) ×2 IMPLANT
SUT VIC AB 3-0 SH 27 (SUTURE) ×2
SUT VIC AB 3-0 SH 27X BRD (SUTURE) ×2 IMPLANT
SYR CONTROL 10ML LL (SYRINGE) ×1 IMPLANT
TOWEL GREEN STERILE FF (TOWEL DISPOSABLE) ×1 IMPLANT

## 2022-08-17 NOTE — Op Note (Signed)
Hernia, Open, Procedure Note  Indications: This is a 77 year old male with multiple medical comorbidities including atrial fibrillation and recent NSTEMI in January of this year. He has 2 new drug-eluting stents. He is anticoagulated on Plavix and Eliquis. His cardiac function has improved. The patient has had known bilateral inguinal hernias for at least a year. These were seen on a CT scan in June 2023. However recently, the patient has noticed increasing pain and swelling in the right groin. This hernia remains reducible. He denies any obstructive symptoms. However the discomfort is increasing. He has attempted to wear a truss over this area with limited success. He presents to discuss surgical repair. Recent CT scan showed small bowel within the right inguinal hernia. On examination, he also had a smaller left inguinal hernia.  Pre-operative Diagnosis: bilateral reducible inguinal hernia Post-operative Diagnosis: same  Surgeon: Wynona Luna   Assistants: None  Anesthesia: General endotracheal anesthesia  ASA Class: 2  Procedure Details  The patient was seen again in the Holding Room. The risks, benefits, complications, treatment options, and expected outcomes were discussed with the patient. The possibilities of reaction to medication, pulmonary aspiration, perforation of viscus, bleeding, recurrent infection, the need for additional procedures, and development of a complication requiring transfusion or further operation were discussed with the patient and/or family. The likelihood of success in repairing the hernia and returning the patient to their previous functional status is good.  There was concurrence with the proposed plan, and informed consent was obtained. The site of surgery was properly noted/marked. The patient was taken to the Operating Room, identified as Ronnald Nian, and the procedure verified as bilateral inguinal hernia repair. A Time Out was held and the above  information confirmed.  The patient was placed in the supine position and underwent induction of anesthesia. The lower abdomen and groin was prepped with Chloraprep and draped in the standard fashion.  We began on the patient's right side.  0.25% Marcaine with epinephrine was used to anesthetize the skin over the mid-portion of the right inguinal canal. An oblique incision was made. Dissection was carried down through the subcutaneous tissue with cautery to the external oblique fascia.  We opened the external oblique fascia along the direction of its fibers to the external ring.  The spermatic cord was circumferentially dissected bluntly and retracted with a Penrose drain.  The floor of the inguinal canal was inspected and was intact.  We skeletonized the spermatic cord and reduced a large indirect hernia sac.  He had a large cord lipoma with a firm nodule in the tip of the lipoma.  I ligated the lipoma with a 2-0 silk and we amputated most of the lipoma along with the nodule.  We tightened the internal inguinal ring with 0 Vicryl.  We used a right Progrip mesh which was inserted and deployed across the floor of the inguinal canal. The mesh was tucked underneath the external oblique fascia laterally.  The flap of the mesh was closed around the spermatic cord to recreate the internal inguinal ring.  The mesh was secured to the pubic tubercle with 0 Vicryl.  Additional stay sutures were placed to attach the mesh to the shelving edge inferiorly and to close the mesh flap.  The external oblique fascia was reapproximated with 2-0 Vicryl.  3-0 Vicryl was used to close the subcutaneous tissues and 4-0 Monocryl was used to close the skin in subcuticular fashion.    We turned our attention to the left side.  0.25% Marcaine with epinephrine was used to anesthetize the skin over the mid-portion of the left inguinal canal. An oblique incision was made. Dissection was carried down through the subcutaneous tissue with cautery  to the external oblique fascia.  We opened the external oblique fascia along the direction of its fibers to the external ring.  The spermatic cord was circumferentially dissected bluntly and retracted with a Penrose drain.  The floor of the inguinal canal was inspected and was intact.  We skeletonized the spermatic cord and reduced a moderate-sized indirect hernia sac.  We used a left Progrip mesh which was inserted and deployed across the floor of the inguinal canal. The mesh was tucked underneath the external oblique fascia laterally.  The flap of the mesh was closed around the spermatic cord to recreate the internal inguinal ring.  The mesh was secured to the pubic tubercle with 0 Vicryl.  Additional stay sutures were placed to attach the mesh to the shelving edge inferiorly and to close the mesh flap.  The external oblique fascia was reapproximated with 2-0 Vicryl.  3-0 Vicryl was used to close the subcutaneous tissues and 4-0 Monocryl was used to close the skin in subcuticular fashion.  Benzoin and steri-strips were used to seal the incisions.  Clean dressings were applied.  The patient was then extubated and brought to the recovery room in stable condition.  All sponge, instrument, and needle counts were correct prior to closure and at the conclusion of the case.   Estimated Blood Loss: Minimal                 Complications: None; patient tolerated the procedure well.         Disposition: PACU - hemodynamically stable.         Condition: stable  Wilmon Arms. Corliss Skains, MD, [facility]  General Surgery   08/17/2022 9:57 AM

## 2022-08-17 NOTE — Discharge Instructions (Signed)
[facility] _______[facility]  UMBILICAL OR INGUINAL HERNIA REPAIR: POST OP INSTRUCTIONS  Always review your discharge instruction sheet given to you by the facility where your surgery was performed. IF YOU HAVE DISABILITY OR FAMILY LEAVE FORMS, YOU MUST BRING THEM TO THE OFFICE FOR PROCESSING.   DO NOT GIVE THEM TO YOUR DOCTOR.  1. A  prescription for pain medication may be given to you upon discharge.  Take your pain medication as prescribed, if needed.  If narcotic pain medicine is not needed, then you may take acetaminophen (Tylenol) or ibuprofen (Advil) as needed. 2. Take your usually prescribed medications unless otherwise directed. If you need a refill on your pain medication, please contact your pharmacy.  They will contact our office to request authorization. Prescriptions will not be filled after 5 pm or on week-ends. 3. You should follow a light diet the first 24 hours after arrival home, such as soup and crackers, etc.  Be sure to include lots of fluids daily.  Resume your normal diet the day after surgery. 4.Most patients will experience some swelling and bruising around the umbilicus or in the groin and scrotum.  Ice packs and reclining will help.  Swelling and bruising can take several days to resolve.  6. It is common to experience some constipation if taking pain medication after surgery.  Increasing fluid intake and taking a stool softener (such as Colace) will usually help or prevent this problem from occurring.  A mild laxative (Milk of Magnesia or Miralax) should be taken according to package directions if there are no bowel movements after 48 hours. 7. Unless discharge instructions indicate otherwise, you may remove your bandages 24-48 hours after surgery, and you may shower at that time.  You may have steri-strips (small skin tapes) in place directly over the incision.  These strips should be left on the skin for 7-10 days.  If your surgeon used skin glue on the  incision, you may shower in 24 hours.  The glue will flake off over the next 2-3 weeks.  Any sutures or staples will be removed at the office during your follow-up visit. 8. ACTIVITIES:  You may resume regular (light) daily activities beginning the next day--such as daily self-care, walking, climbing stairs--gradually increasing activities as tolerated.  You may have sexual intercourse when it is comfortable.  Refrain from any heavy lifting or straining until approved by your doctor.  a.You may drive when you are no longer taking prescription pain medication, you can comfortably wear a seatbelt, and you can safely maneuver your car and apply brakes. b.RETURN TO WORK:   _____________________________________________  9.You should see your doctor in the office for a follow-up appointment approximately 2-3 weeks after your surgery.  Make sure that you call for this appointment within a day or two after you arrive home to insure a convenient appointment time. 10.OTHER INSTRUCTIONS: _________________________    _____________________________________  WHEN TO CALL YOUR DOCTOR: Fever over 101.0 Inability to urinate Nausea and/or vomiting Extreme swelling or bruising Continued bleeding from incision. Increased pain, redness, or drainage from the incision  The clinic staff is available to answer your questions during regular business hours.  Please don't hesitate to call and ask to speak to one of the nurses for clinical concerns.  If you have a medical emergency, go to the nearest emergency room or call 911.  A surgeon from [facility] is always on call at the hospital   [address], [address],   [place], [place]  [address] ?  [address] [address], [place], [place]   [address] [phone] ? [phone] ? FAX [phone] Web site: [email or web address]  

## 2022-08-17 NOTE — Anesthesia Procedure Notes (Signed)
Procedure Name: Intubation Date/Time: 08/17/2022 7:53 AM  Performed by: Maxine Glenn, CRNAPre-anesthesia Checklist: Patient identified, Emergency Drugs available, Suction available and Patient being monitored Patient Re-evaluated:Patient Re-evaluated prior to induction Oxygen Delivery Method: Circle System Utilized Preoxygenation: Pre-oxygenation with 100% oxygen Induction Type: IV induction Ventilation: Mask ventilation without difficulty and Oral airway inserted - appropriate to patient size Laryngoscope Size: Mac and 4 Grade View: Grade I Tube type: Oral Tube size: 7.5 mm Number of attempts: 1 Airway Equipment and Method: Stylet and Oral airway Placement Confirmation: ETT inserted through vocal cords under direct vision, positive ETCO2 and breath sounds checked- equal and bilateral Secured at: 22 cm Tube secured with: Tape Dental Injury: Teeth and Oropharynx as per pre-operative assessment

## 2022-08-17 NOTE — Anesthesia Procedure Notes (Signed)
Anesthesia Procedure Image    

## 2022-08-17 NOTE — H&P (Signed)
Chief Complaint: New Consultation and Inguinal Hernia   PCP - Carylon Perches Referring - John Giovanni Cardiologist - [place]   History of Present Illness: Arthur Wilson is a 77 y.o. male who is seen today as an office consultation at the request of Dr. Charlotta Newton for evaluation of New Consultation and Inguinal Hernia .   This is a 77 year old male with multiple medical comorbidities including atrial fibrillation and recent NSTEMI in January of this year.  He has 2 new drug-eluting stents.  He is anticoagulated on Plavix and Eliquis.  His cardiac function has improved.  The patient has had known bilateral inguinal hernias for at least a year.  These were seen on a CT scan in June 2023.  However recently, the patient has noticed increasing pain and swelling in the right groin.  This hernia remains reducible.  He denies any obstructive symptoms.  However the discomfort is increasing.  He has attempted to wear a truss over this area with limited success.  He presents to discuss surgical repair.  Recent CT scan showed small bowel within the right inguinal hernia.   Review of Systems: A complete review of systems was obtained from the patient.  I have reviewed this information and discussed as appropriate with the patient.  See HPI as well for other ROS.   Review of Systems  Constitutional:  Positive for weight loss.  HENT: Negative.    Eyes: Negative.   Respiratory: Negative.    Cardiovascular:  Positive for leg swelling.  Gastrointestinal:  Positive for abdominal pain.  Genitourinary: Negative.   Musculoskeletal:  Positive for neck pain.  Skin: Negative.   Neurological: Negative.   Endo/Heme/Allergies:  Bruises/bleeds easily.  Psychiatric/Behavioral: Negative.          Medical History:     Past Medical History:  Diagnosis Date   Anxiety     Arthritis     Diabetes mellitus without complication (CMS/HHS-HCC)     Sleep apnea           Patient Active Problem List  Diagnosis    Non-recurrent bilateral inguinal hernia without obstruction or gangrene   Acute diastolic CHF (congestive heart failure) (CMS/HHS-HCC)   AKI (acute kidney injury) (CMS-HCC)   Atrial fibrillation with rapid ventricular response (CMS/HHS-HCC)   Benign prostatic hyperplasia with urinary obstruction   Cervical spondylosis without myelopathy   Contusion of right knee   Elevated brain natriuretic peptide (BNP) level   Elevated prostate specific antigen (PSA)   NSTEMI (non-ST elevated myocardial infarction) (CMS/HHS-HCC)   OSA on CPAP   Type 2 diabetes mellitus with hyperglycemia (CMS/HHS-HCC)           Past Surgical History:  Procedure Laterality Date   Coronary atherectomy       Coronary stent intervention        Right/left heart cath and coronary angiography          No Known Allergies         Current Outpatient Medications on File Prior to Visit  Medication Sig Dispense Refill   acetaminophen (TYLENOL) 500 MG tablet Take by mouth       ALPRAZolam (XANAX) 0.5 MG tablet         atorvastatin (LIPITOR) 40 MG tablet Take 40 mg by mouth once daily       clopidogreL (PLAVIX) 75 mg tablet Take by mouth       DEXCOM G7 SENSOR Devi         ELIQUIS 5 mg tablet  empagliflozin (JARDIANCE) 10 mg tablet Take 1 tablet by mouth once daily       FUROsemide (LASIX) 40 MG tablet Take 40 mg by mouth every Monday and Thursday       HYDROcodone-acetaminophen (NORCO) 7.5-325 mg tablet Take 1 tablet every 4 hours by oral route.       metFORMIN (GLUCOPHAGE) 500 MG tablet         metoprolol succinate (TOPROL-XL) 100 MG XL tablet Take by mouth       pantoprazole (PROTONIX) 40 MG DR tablet Take by mouth        No current facility-administered medications on file prior to visit.           Family History  Problem Relation Age of Onset   Obesity Mother     Deep vein thrombosis (DVT or abnormal blood clot formation) Mother     Breast cancer Mother     High blood pressure (Hypertension) Father      Hyperlipidemia (Elevated cholesterol) Father     Coronary Artery Disease (Blocked arteries around heart) Father     Obesity Sister     Hyperlipidemia (Elevated cholesterol) Sister     Coronary Artery Disease (Blocked arteries around heart) Sister        Social History       Tobacco Use  Smoking Status Never  Smokeless Tobacco Never      Social History        Socioeconomic History   Marital status: Married  Tobacco Use   Smoking status: Never   Smokeless tobacco: Never  Substance and Sexual Activity   Alcohol use: Not Currently   Drug use: Never      Objective:          Body mass index is 30.99 kg/m.   Physical Exam    Constitutional:  WDWN in NAD, conversant, no obvious deformities; lying in bed comfortably Eyes:  Pupils equal, round; sclera anicteric; moist conjunctiva; no lid lag HENT:  Oral mucosa moist; good dentition  Neck:  No masses palpated, trachea midline; no thyromegaly Lungs:  CTA bilaterally; normal respiratory effort CV:  Regular rate and rhythm; no murmurs; extremities well-perfused with no edema Abd:  +bowel sounds, soft, non-tender, no palpable organomegaly; no palpable hernias GU: Bilateral descended testes, no testicular masses.  Large right inguinal hernia that is reducible when supine.  Small spontaneously reducible left inguinal hernia Musc: Ambulates slowly with a walker.  No apparent clubbing or cyanosis in extremities Lymphatic:  No palpable cervical or axillary lymphadenopathy Skin:  Warm, dry; no sign of jaundice Psychiatric - alert and oriented x 4; calm mood and affect     Labs, Imaging and Diagnostic Testing: CLINICAL DATA:  Right lower quadrant pain   EXAM: CT ABDOMEN AND PELVIS WITH CONTRAST   TECHNIQUE: Multidetector CT imaging of the abdomen and pelvis was performed using the standard protocol following bolus administration of intravenous contrast.   RADIATION DOSE REDUCTION: This exam was performed according to  the departmental dose-optimization program which includes automated exposure control, adjustment of the mA and/or kV according to patient size and/or use of iterative reconstruction technique.   CONTRAST:  80mL OMNIPAQUE IOHEXOL 300 MG/ML  SOLN   COMPARISON:  CT abdomen and pelvis dated October 05, 2021.   FINDINGS: Lower chest: Small solid pulmonary nodule of the left lower lobe measuring 5 mm, unchanged compared with the prior exam. Coronary artery calcifications.   Hepatobiliary: No focal liver abnormality is seen. No gallstones, gallbladder wall  thickening, or biliary dilatation.   Pancreas: Unremarkable. No pancreatic ductal dilatation or surrounding inflammatory changes.   Spleen: Normal in size without focal abnormality.   Adrenals/Urinary Tract: Bilateral adrenal glands are unremarkable. No hydronephrosis. Nonobstructing stone of the upper pole of the left kidney measuring 6 mm, unchanged when compared with the prior. Small diverticulum of the right lateral bladder wall.   Stomach/Bowel: Mildly dilated loops of small bowel are seen within a moderate sized right inguinal hernia. No intra-abdominal upstream dilation of the small bowel. Normal appendix. No evidence of bowel wall thickening or hypoenhancement. Normal appearance of the stomach.   Vascular/Lymphatic: Aortic atherosclerosis. No enlarged abdominal or pelvic lymph nodes.   Reproductive: Prostatomegaly, measuring up to 5.5 cm.   Other: Moderate size right inguinal hernia described above. Small left inguinal hernia.   Musculoskeletal: Mild levocurvature of the lumbar spine. Degenerative disc disease of the lumbar spine. No acute osseous abnormality.   IMPRESSION: 1. A mildly dilated loop of small bowel is seen within a moderate sized right inguinal hernia, previously only fat containing. No upstream dilation of the small bowel. Correlate clinically for reducibility. 2. Small solid pulmonary nodule of the  left lower lobe measuring 5 mm. No follow-up needed if patient is low-risk.This recommendation follows the consensus statement: Guidelines for Management of Incidental Pulmonary Nodules Detected on CT Images: From the Fleischner Society 2017; Radiology 2017; 284:228-243. 3. Aortic Atherosclerosis (ICD10-I70.0).   Results were called by telephone at the time of interpretation on 06/19/2022 at 5:37 pm to provider Gareth Morgan , who verbally acknowledged these results.     Electronically Signed   By: Allegra Lai M.D.   On: 06/19/2022 17:39   Assessment and Plan:  Diagnoses and all orders for this visit:   Non-recurrent bilateral inguinal hernia without obstruction or gangrene     Recommend bilateral inguinal hernia repairs with mesh.  Due to the large size of the right inguinal hernia, recommend open repair.  The surgical procedure has been discussed with the patient.  Potential risks, benefits, alternative treatments, and expected outcomes have been explained.  All of the patient's questions at this time have been answered.  The likelihood of reaching the patient's treatment goal is good.  The patient understand the proposed surgical procedure and wishes to proceed. Wilmon Arms. Corliss Skains, MD, [facility]  General Surgery   08/17/2022 6:44 AM

## 2022-08-17 NOTE — Anesthesia Postprocedure Evaluation (Signed)
Anesthesia Post Note  Patient: Arthur Wilson  Procedure(s) Performed: OPEN BILATERAL INGUINAL HERNIA REPAIRS WITH MESH (Bilateral: Inguinal)     Patient location during evaluation: PACU Anesthesia Type: General Level of consciousness: awake and alert and oriented Pain management: pain level controlled Vital Signs Assessment: post-procedure vital signs reviewed and stable Respiratory status: spontaneous breathing, nonlabored ventilation and respiratory function stable Cardiovascular status: blood pressure returned to baseline and stable Postop Assessment: no apparent nausea or vomiting Anesthetic complications: no   No notable events documented.  Last Vitals:  Vitals:   08/17/22 1000 08/17/22 1019  BP: (!) 144/93 (!) 145/83  Pulse: 68 66  Resp: 14 16  Temp:  36.7 C  SpO2: 96% 95%    Last Pain:  Vitals:   08/17/22 1019  TempSrc:   PainSc: 0-No pain                 Rether Rison A.

## 2022-08-17 NOTE — Transfer of Care (Signed)
Immediate Anesthesia Transfer of Care Note  Patient: Arthur Wilson  Procedure(s) Performed: OPEN BILATERAL INGUINAL HERNIA REPAIRS WITH MESH (Bilateral: Inguinal)  Patient Location: PACU  Anesthesia Type:General and Regional  Level of Consciousness: awake and alert   Airway & Oxygen Therapy: Patient Spontanous Breathing  Post-op Assessment: Report given to RN and Post -op Vital signs reviewed and stable  Post vital signs: Reviewed and stable  Last Vitals:  Vitals Value Taken Time  BP 131/76 08/17/22 0949  Temp    Pulse 68 08/17/22 0950  Resp 9 08/17/22 0950  SpO2 92 % 08/17/22 0950  Vitals shown include unvalidated device data.  Last Pain:  Vitals:   08/17/22 0600  TempSrc:   PainSc: 0-No pain      Patients Stated Pain Goal: 0 (08/17/22 0600)  Complications: No notable events documented.

## 2022-08-17 NOTE — Anesthesia Procedure Notes (Signed)
Anesthesia Regional Block: TAP block   Pre-Anesthetic Checklist: , timeout performed,  Correct Patient, Correct Site, Correct Laterality,  Correct Procedure, Correct Position, site marked,  Risks and benefits discussed,  Surgical consent,  Pre-op evaluation,  At surgeon's request and post-op pain management  Laterality: Left and Right  Prep: chloraprep       Needles:  Injection technique: Single-shot  Needle Type: Echogenic Needle     Needle Length: 9cm      Additional Needles:   Procedures:,,,, ultrasound used (permanent image in chart),,    Narrative:  Start time: 08/17/2022 7:20 AM End time: 08/17/2022 7:30 AM Injection made incrementally with aspirations every 5 mL.  Performed by: Personally  Anesthesiologist: Eilene Ghazi, MD  Additional Notes: Patient tolerated the procedure well without complications

## 2022-08-18 ENCOUNTER — Encounter (HOSPITAL_COMMUNITY): Payer: Self-pay | Admitting: Surgery

## 2022-08-18 LAB — SURGICAL PATHOLOGY

## 2022-08-22 DIAGNOSIS — Z8719 Personal history of other diseases of the digestive system: Secondary | ICD-10-CM | POA: Diagnosis not present

## 2022-08-22 DIAGNOSIS — I5032 Chronic diastolic (congestive) heart failure: Secondary | ICD-10-CM | POA: Diagnosis not present

## 2022-08-22 DIAGNOSIS — I4821 Permanent atrial fibrillation: Secondary | ICD-10-CM | POA: Diagnosis not present

## 2022-08-22 DIAGNOSIS — E119 Type 2 diabetes mellitus without complications: Secondary | ICD-10-CM | POA: Diagnosis not present

## 2022-09-20 DIAGNOSIS — M25562 Pain in left knee: Secondary | ICD-10-CM | POA: Diagnosis not present

## 2022-09-20 DIAGNOSIS — I4821 Permanent atrial fibrillation: Secondary | ICD-10-CM | POA: Diagnosis not present

## 2022-09-20 DIAGNOSIS — M170 Bilateral primary osteoarthritis of knee: Secondary | ICD-10-CM | POA: Diagnosis not present

## 2022-09-20 DIAGNOSIS — Z7901 Long term (current) use of anticoagulants: Secondary | ICD-10-CM | POA: Diagnosis not present

## 2022-10-02 ENCOUNTER — Other Ambulatory Visit (HOSPITAL_COMMUNITY): Payer: Self-pay | Admitting: Adult Health

## 2022-10-05 DIAGNOSIS — I5032 Chronic diastolic (congestive) heart failure: Secondary | ICD-10-CM | POA: Diagnosis not present

## 2022-10-05 DIAGNOSIS — I4821 Permanent atrial fibrillation: Secondary | ICD-10-CM | POA: Diagnosis not present

## 2022-10-05 DIAGNOSIS — Z8719 Personal history of other diseases of the digestive system: Secondary | ICD-10-CM | POA: Diagnosis not present

## 2022-10-05 DIAGNOSIS — E119 Type 2 diabetes mellitus without complications: Secondary | ICD-10-CM | POA: Diagnosis not present

## 2022-10-11 ENCOUNTER — Other Ambulatory Visit (HOSPITAL_COMMUNITY)
Admission: RE | Admit: 2022-10-11 | Discharge: 2022-10-11 | Disposition: A | Discharge status: Home / Self Care | Payer: Medicare Other | Source: Ambulatory Visit | Attending: Student | Admitting: Student

## 2022-10-11 ENCOUNTER — Ambulatory Visit: Payer: Medicare Other | Attending: Student | Admitting: Student

## 2022-10-11 ENCOUNTER — Encounter: Payer: Self-pay | Admitting: Student

## 2022-10-11 VITALS — BP 118/70 | HR 59 | Ht 70.0 in | Wt 208.2 lb

## 2022-10-11 DIAGNOSIS — I2510 Atherosclerotic heart disease of native coronary artery without angina pectoris: Secondary | ICD-10-CM | POA: Diagnosis not present

## 2022-10-11 DIAGNOSIS — Z7901 Long term (current) use of anticoagulants: Secondary | ICD-10-CM

## 2022-10-11 DIAGNOSIS — I4821 Permanent atrial fibrillation: Secondary | ICD-10-CM | POA: Diagnosis not present

## 2022-10-11 DIAGNOSIS — Z79899 Other long term (current) drug therapy: Secondary | ICD-10-CM | POA: Diagnosis not present

## 2022-10-11 DIAGNOSIS — I340 Nonrheumatic mitral (valve) insufficiency: Secondary | ICD-10-CM

## 2022-10-11 DIAGNOSIS — I10 Essential (primary) hypertension: Secondary | ICD-10-CM | POA: Diagnosis not present

## 2022-10-11 DIAGNOSIS — G4733 Obstructive sleep apnea (adult) (pediatric): Secondary | ICD-10-CM

## 2022-10-11 DIAGNOSIS — E785 Hyperlipidemia, unspecified: Secondary | ICD-10-CM

## 2022-10-11 LAB — BASIC METABOLIC PANEL
Anion gap: 5 (ref 5–15)
BUN: 24 mg/dL — ABNORMAL HIGH (ref 8–23)
CO2: 29 mmol/L (ref 22–32)
Calcium: 8.9 mg/dL (ref 8.9–10.3)
Chloride: 99 mmol/L (ref 98–111)
Creatinine, Ser: 1.19 mg/dL (ref 0.61–1.24)
GFR, Estimated: >60 mL/min (ref >60)
Glucose, Bld: 96 mg/dL (ref 70–99)
Potassium: 4.6 mmol/L (ref 3.5–5.1)
Sodium: 133 mmol/L — ABNORMAL LOW (ref 135–145)

## 2022-10-11 LAB — CBC
HCT: 38 % — ABNORMAL LOW (ref 39.0–52.0)
Hemoglobin: 12.2 g/dL — ABNORMAL LOW (ref 13.0–17.0)
MCH: 28.9 pg (ref 26.0–34.0)
MCHC: 32.1 g/dL (ref 30.0–36.0)
MCV: 90 fL (ref 80.0–100.0)
Platelets: 187 10*3/uL (ref 150–400)
RBC: 4.22 MIL/uL (ref 4.22–5.81)
RDW: 16.3 % — ABNORMAL HIGH (ref 11.5–15.5)
WBC: 6.1 10*3/uL (ref 4.0–10.5)
nRBC: 0 % (ref 0.0–0.2)

## 2022-10-11 NOTE — Patient Instructions (Addendum)
Medication Instructions:  Your physician recommends that you continue on your current medications as directed. Please refer to the Current Medication list given to you today.  Stop Taking Plavix   *If you need a refill on your cardiac medications before your next appointment, please call your pharmacy*   Lab Work: Your physician recommends that you return for lab work in: Today ( CBC, BMET)   If you have labs (blood work) drawn today and your tests are completely normal, you will receive your results only by: MyChart Message (if you have MyChart) OR A paper copy in the mail If you have any lab test that is abnormal or we need to change your treatment, we will call you to review the results.   Testing/Procedures: NONE    Follow-Up: At [facility], you and your health needs are our priority.  As part of our continuing mission to provide you with exceptional heart care, we have created designated Provider Care Teams.  These Care Teams include your primary Cardiologist (physician) and Advanced Practice Providers (APPs -  Physician Assistants and Nurse Practitioners) who all work together to provide you with the care you need, when you need it.  We recommend signing up for the patient portal called "MyChart".  Sign up information is provided on this After Visit Summary.  MyChart is used to connect with patients for Virtual Visits (Telemedicine).  Patients are able to view lab/test results, encounter notes, upcoming appointments, etc.  Non-urgent messages can be sent to your provider as well.   To learn more about what you can do with MyChart, go to [email or web address].    Your next appointment:   4 -5 month(s)  Provider:   You may see Nona Dell, MD or one of the following Advanced Practice Providers on your designated Care Team:   Randall An, PA-C  Jacolyn Reedy, PA-C     Other Instructions Thank you for choosing [facility]!

## 2022-10-11 NOTE — Progress Notes (Signed)
Cardiology Office Note    Date:  10/11/2022  ID:  Arthur Wilson, DOB 04-22-1945, MRN [id number] Cardiologist: Nona Dell, MD    History of Present Illness:    Arthur Wilson is a 77 y.o. male with past medical history of CAD (s/p NSTEMI in 03/2022 with DESx2 to RCA and staged orbital atherectomy/DES placement to proximal LAD), permanent atrial fibrillation (rate-control pursued given asymptomatic state, OSA and atrial dilation), HTN, HLD and OSA who presents to the office today for 70-month follow-up.  He was examined by myself in 06/2022 and denied any recent anginal symptoms at that time. His most limiting factor was pain along his groin region given his hernia. Given that he was less than 6 months out from stent placement, he was informed of the increased risk of stent thrombosis and his case had been reviewed with Dr. Diona Browner and Dr. Lynnette Caffey and it was recommended that he be on ASA 81 mg daily while off Plavix for the 5 days. He did undergo bilateral inguinal hernia repair in 07/2022 with no immediate complications noted.   In talking with the patient today, he reports he has felt weak since having his hernia repair surgery in 07/2022 and is gradually trying to increase his activity level. He is planning to participate in cardiac rehab as this was previously delayed given his surgery. He denies any specific chest pain or dyspnea on exertion. No recent palpitations, orthopnea or PND. He does experience intermittent lower extremity edema and takes Lasix 40 mg once weekly. He has been experiencing issues with hypoglycemia and was able to stop Metformin and has also reduced Jardiance to 5 mg daily. He reports an episode of fever and chills last weekend but says this resolved after taking Tylenol. Reports one episode of hemoptysis during that timeframe but no recurrence. No recent melena, hematochezia or hematuria.   Studies Reviewed:   EKG: EKG is not ordered today.  Cardiac  Catheterization: 03/2022   Prox LAD lesion is 75% stenosed.   1st Mrg lesion is 90% stenosed.   Ost RCA to Prox RCA lesion is 60% stenosed.   Prox RCA lesion is 70% stenosed.   A stent was successfully placed.   Post intervention, there is a 0% residual stenosis.   Post intervention, there is a 0% residual stenosis.   1.  High-grade disease of proximal LAD and obtuse marginal with RFR positive ostial and proximal to mid right coronary artery disease. 2.  After review with Dr. Anne Fu, PCI of the right coronary artery was pursued with 2 overlapping drug-eluting stents placed. 3.  Due to dye expenditure, PCI of the proximal LAD and left circumflex will be staged depending on renal function. 4.  LVEDP of 9 mmHg. 5.  Fick cardiac output of 5.5 L/min and Fick cardiac index of 2.4 L/min/m with a mean RA pressure of 11 mmHg, mean wedge pressure of 18 mmHg, and mean PA pressure of 30 mmHg. 6.  Very difficult cannulation of right coronary artery; please see procedural details.   Recommendation: Given history of atrial fibrillation the patient will be maintained on triple therapy for 1 month and then Plavix and Eliquis for 6 months in total and then Eliquis indefinitely.  Depending on the patient's creatinine staged PCI of the LAD and left circumflex will be pursued.    Coronary Stent Intervention: 03/2022   Prox LAD lesion is 75% stenosed.   1st Mrg lesion is 90% stenosed.   Non-stenotic Ost RCA to Prox RCA  lesion was previously treated.   Non-stenotic Prox RCA lesion was previously treated.   A stent was successfully placed.   Post intervention, there is a 0% residual stenosis.   1.  Successful PCI of proximal LAD aided by orbital atherectomy with OCT optimization. 2.  Residual high-grade and highly calcified lesion of the obtuse marginal; given the relatively small territory subtended by this vessel and that the patient's symptoms are dyspnea rather than angina, after discussion with Dr. Anne Fu  medical therapy will be pursued for this burden of disease. 3.  LVEDP of 10 mmHg but obscured by widely fluctuating RR intervals due to atrial fibrillation.   Recommendation: Triple therapy for ideally 4 weeks then Plavix and Eliquis for 6 months in total then Eliquis monotherapy thereafter.  Risk Assessment/Calculations:    CHA2DS2-VASc Score = 5   This indicates a 7.2% annual risk of stroke. The patient's score is based upon: CHF History: 1 HTN History: 1 Diabetes History: 1 Stroke History: 0 Vascular Disease History: 1 Age Score: 1 Gender Score: 0    Physical Exam:   VS:  BP 118/70   Pulse (!) 59   Ht 5\' 10"  (1.778 m)   Wt 208 lb 3.2 oz (94.4 kg)   SpO2 96%   BMI 29.87 kg/m    Wt Readings from Last 3 Encounters:  10/11/22 208 lb 3.2 oz (94.4 kg)  08/17/22 202 lb (91.6 kg)  08/10/22 203 lb (92.1 kg)     GEN: Well nourished, well developed male appearing in no acute distress NECK: No JVD; No carotid bruits CARDIAC: Irregularly irregular, no murmurs, rubs, gallops RESPIRATORY:  Clear to auscultation without rales, wheezing or rhonchi  ABDOMEN: Appears non-distended. No obvious abdominal masses. EXTREMITIES: No clubbing or cyanosis. Trace lower extremity edema. Compression stockings in place.  Distal pedal pulses are 2+ bilaterally.   Assessment and Plan:   1. CAD - He is s/p NSTEMI in 03/2022 with DESx2 to RCA and staged orbital atherectomy/DES placement to proximal LAD. He denies any recent anginal symptoms. We reviewed that since he is 6 months out from stent placement, Plavix can be discontinued as outlined at time of his catheterization given the concurrent use of Eliquis. Continue Atorvastatin 40 mg daily and Toprol-XL 100 mg twice daily. He is planning to begin cardiac rehab and was encouraged to pursue this. I will send a note to them today to follow-up in regards to this  2. Permanent Atrial Fibrillation/Use of Long-term Anticoagulation - He denies any recent  palpitations and his heart rate is well-controlled in the 50's to 60's during today's visit.  Remains on Toprol-XL 100 mg twice daily for rate control. - Continue Eliquis 5 mg twice daily for anticoagulation. Will recheck a CBC and BMET.   3. HTN - His BP is well-controlled at 118/70 during today's visit. He remains on Toprol-XL 100 mg twice daily.  4. HLD - He is scheduled for follow-up fasting labs with his PCP next month. Remains on Atorvastatin 40 mg daily.  5. OSA - Continued compliance with CPAP encouraged.  6. Mitral Regurgitation - This was mild to moderate by most recent echo in 03/2022. Would anticipate repeat imaging in 1 year.   Signed, Ellsworth Lennox, PA-C

## 2022-10-13 ENCOUNTER — Encounter (HOSPITAL_COMMUNITY): Payer: Self-pay

## 2022-10-17 ENCOUNTER — Ambulatory Visit (HOSPITAL_COMMUNITY)
Admission: RE | Admit: 2022-10-17 | Discharge: 2022-10-17 | Disposition: A | Discharge status: Home / Self Care | Payer: Medicare Other | Source: Ambulatory Visit | Attending: Cardiology | Admitting: Cardiology

## 2022-10-17 DIAGNOSIS — Z955 Presence of coronary angioplasty implant and graft: Secondary | ICD-10-CM | POA: Insufficient documentation

## 2022-10-17 DIAGNOSIS — I214 Non-ST elevation (NSTEMI) myocardial infarction: Secondary | ICD-10-CM | POA: Insufficient documentation

## 2022-10-17 NOTE — Progress Notes (Signed)
Completed virtual orientation today.  EP evaluation is scheduled for Wednesday July 24 at 1400.  Documentation for diagnosis can be found in [facility] encounter 03/28/22 and office visit 10/11/22.

## 2022-10-18 ENCOUNTER — Encounter (HOSPITAL_COMMUNITY)
Admission: RE | Admit: 2022-10-18 | Discharge: 2022-10-18 | Disposition: A | Discharge status: Home / Self Care | Payer: Medicare Other | Source: Ambulatory Visit | Attending: Cardiology | Admitting: Cardiology

## 2022-10-18 VITALS — Ht 69.0 in | Wt 205.0 lb

## 2022-10-18 DIAGNOSIS — I214 Non-ST elevation (NSTEMI) myocardial infarction: Secondary | ICD-10-CM | POA: Diagnosis not present

## 2022-10-18 DIAGNOSIS — Z955 Presence of coronary angioplasty implant and graft: Secondary | ICD-10-CM | POA: Diagnosis not present

## 2022-10-18 NOTE — Progress Notes (Signed)
Cardiac Individual Treatment Plan  Patient Details  Name: Arthur Wilson MRN: [id number] Date of Birth: Dec 23, 1945 Referring Provider:   Flowsheet Row CARDIAC REHAB PHASE II ORIENTATION from 10/18/2022 in [facility] CARDIAC REHABILITATION  Referring Provider Dr. Jomarie Longs       Initial Encounter Date:  Flowsheet Row CARDIAC REHAB PHASE II ORIENTATION from 10/18/2022 in [place] CARDIAC REHABILITATION  Date 10/18/22       Visit Diagnosis: NSTEMI (non-ST elevated myocardial infarction) [facility])  Status post coronary artery stent placement  Patient's Home Medications on Admission:  Current Outpatient Medications:    acetaminophen (TYLENOL) 500 MG tablet, Take 1,000 mg by mouth every 6 (six) hours as needed for moderate pain., Disp: , Rfl:    ALPRAZolam (XANAX) 0.5 MG tablet, Take 0.5 tablets by mouth at bedtime as needed for anxiety or sleep., Disp: , Rfl:    atorvastatin (LIPITOR) 40 MG tablet, Take 1 tablet (40 mg total) by mouth daily., Disp: 30 tablet, Rfl: 0   Continuous Blood Gluc Sensor (DEXCOM G7 SENSOR) MISC, for 30 Days, Disp: , Rfl:    ELIQUIS 5 MG TABS tablet, Take 5 mg by mouth 2 (two) times daily., Disp: , Rfl:    empagliflozin (JARDIANCE) 10 MG TABS tablet, Take 1 tablet (10 mg total) by mouth daily. (Patient taking differently: Take 5 mg by mouth daily.), Disp: 30 tablet, Rfl: 0   fluticasone (FLONASE) 50 MCG/ACT nasal spray, Place 1 spray into both nostrils daily as needed for allergies., Disp: , Rfl:    furosemide (LASIX) 40 MG tablet, 1 tablet Oral once a week for 30 days, Disp: , Rfl:    HYDROcodone-acetaminophen (NORCO) 7.5-325 MG tablet, Take 0.5-1 tablets by mouth 3 (three) times daily as needed for moderate pain., Disp: , Rfl:    HYDROcodone-acetaminophen (NORCO/VICODIN) 5-325 MG tablet, Take 1 tablet by mouth every 6 (six) hours as needed for moderate pain., Disp: 20 tablet, Rfl: 0   metFORMIN (GLUCOPHAGE) 500 MG tablet, Take 500 mg by mouth at bedtime. (Patient  not taking: Reported on 10/11/2022), Disp: , Rfl:    pantoprazole (PROTONIX) 40 MG tablet, Take 1 tablet (40 mg total) by mouth daily at 12 noon., Disp: 30 tablet, Rfl: 0  Past Medical History: Past Medical History:  Diagnosis Date   Atrial fibrillation (HCC)    BPH (benign prostatic hyperplasia)    Cervical spondylosis without myelopathy    Coronary artery disease    a. s/p NSTEMI in 03/2022 with DESx2 to RCA and staged orbital atherectomy/DES placement to proximal LAD   Diabetes mellitus without complication (HCC)    Essential hypertension    History of kidney stones    showed on CT scan   Impaired glucose tolerance    Inguinal hernia bilateral, non-recurrent    Myocardial infarction (HCC) 03/30/2022   OSA on CPAP    Testicular hypofunction     Tobacco Use: Social History   Tobacco Use  Smoking Status Never  Smokeless Tobacco Never    Labs: Review Flowsheet       Latest Ref Rng & Units 03/29/2022 03/30/2022 08/10/2022  Labs for ITP Cardiac and Pulmonary Rehab  Hemoglobin A1c 4.8 - 5.6 % 7.4  - 6.0   PH, Arterial 7.35 - 7.45 - 7.459  7.501  -  PCO2 arterial 32 - 48 mmHg - 42.9  38.9  -  Bicarbonate 20.0 - 28.0 mmol/L - 31.1  30.4  30.4  -  TCO2 22 - 32 mmol/L - 32  32  32  -  O2 Saturation % - 54  60  90  -    Details       Multiple values from one day are sorted in reverse-chronological order         Capillary Blood Glucose: Lab Results  Component Value Date   GLUCAP 120 (H) 08/17/2022   GLUCAP 120 (H) 08/17/2022   GLUCAP 101 (H) 08/17/2022   GLUCAP 132 (H) 08/10/2022   GLUCAP 118 (H) 04/04/2022     Exercise Target Goals: Exercise Program Goal: Individual exercise prescription set using results from initial 6 min walk test and THRR while considering  patient's activity barriers and safety.   Exercise Prescription Goal: Starting with aerobic activity 30 plus minutes a day, 3 days per week for initial exercise prescription. Provide home exercise  prescription and guidelines that participant acknowledges understanding prior to discharge.  Activity Barriers & Risk Stratification:  Activity Barriers & Cardiac Risk Stratification - 10/17/22 0945       Activity Barriers & Cardiac Risk Stratification   Activity Barriers Deconditioning;Muscular Weakness;Other (comment);Joint Problems;History of Falls;Incisional Pain;Back Problems    Comments two hernia repair surgeries in May 2024 still weak from being out for , knee pain from fall last year not sure whats wrong, calf on left leg cramping/painful, occassional low back pain, spndylosis    Cardiac Risk Stratification Moderate             6 Minute Walk:  6 Minute Walk     Row Name 10/18/22 1552         6 Minute Walk   Phase Initial     Distance 800 feet     Walk Time 6 minutes     # of Rest Breaks 0     MPH 1.51     METS 1.59     RPE 12     VO2 Peak 5.59     Symptoms No     Resting HR 80 bpm     Resting BP 136/68     Resting Oxygen Saturation  94 %     Exercise Oxygen Saturation  during 6 min walk 94 %     Max Ex. HR 93 bpm     Max Ex. BP 130/68     2 Minute Post BP 132/68              Oxygen Initial Assessment:   Oxygen Re-Evaluation:   Oxygen Discharge (Final Oxygen Re-Evaluation):   Initial Exercise Prescription:  Initial Exercise Prescription - 10/18/22 1500       Date of Initial Exercise RX and Referring Provider   Date 10/18/22    Referring Provider Dr. Jomarie Longs      Oxygen   Maintain Oxygen Saturation 88% or higher      Treadmill   MPH 1.5    Grade 0    Minutes 15      NuStep   Level 1    SPM 80    Minutes 15      Prescription Details   Frequency (times per week) 3    Duration Progress to 30 minutes of continuous aerobic without signs/symptoms of physical distress      Intensity   THRR 40-80% of Max Heartrate 106-133    Ratings of Perceived Exertion 11-13    Perceived Dyspnea 0-4      Resistance Training   Training  Prescription Yes    Weight 3    Reps 10-15  Perform Capillary Blood Glucose checks as needed.  Exercise Prescription Changes:   Exercise Comments:   Exercise Goals and Review:   Exercise Goals     Row Name 10/18/22 1556             Exercise Goals   Increase Physical Activity Yes       Intervention Provide advice, education, support and counseling about physical activity/exercise needs.;Develop an individualized exercise prescription for aerobic and resistive training based on initial evaluation findings, risk stratification, comorbidities and participant's personal goals.       Expected Outcomes Short Term: Attend rehab on a regular basis to increase amount of physical activity.;Long Term: Add in home exercise to make exercise part of routine and to increase amount of physical activity.;Long Term: Exercising regularly at least 3-5 days a week.       Increase Strength and Stamina Yes       Intervention Provide advice, education, support and counseling about physical activity/exercise needs.;Develop an individualized exercise prescription for aerobic and resistive training based on initial evaluation findings, risk stratification, comorbidities and participant's personal goals.       Expected Outcomes Short Term: Increase workloads from initial exercise prescription for resistance, speed, and METs.;Short Term: Perform resistance training exercises routinely during rehab and add in resistance training at home;Long Term: Improve cardiorespiratory fitness, muscular endurance and strength as measured by increased METs and functional capacity ( )       Able to understand and use rate of perceived exertion (RPE) scale Yes       Intervention Provide education and explanation on how to use RPE scale       Expected Outcomes Short Term: Able to use RPE daily in rehab to express subjective intensity level;Long Term:  Able to use RPE to guide intensity level when exercising  independently       Knowledge and understanding of Target Heart Rate Range (THRR) Yes       Intervention Provide education and explanation of THRR including how the numbers were predicted and where they are located for reference       Expected Outcomes Short Term: Able to state/look up THRR;Long Term: Able to use THRR to govern intensity when exercising independently;Short Term: Able to use daily as guideline for intensity in rehab       Able to check pulse independently Yes       Intervention Provide education and demonstration on how to check pulse in carotid and radial arteries.;Review the importance of being able to check your own pulse for safety during independent exercise       Expected Outcomes Short Term: Able to explain why pulse checking is important during independent exercise;Long Term: Able to check pulse independently and accurately       Understanding of Exercise Prescription Yes       Intervention Provide education, explanation, and written materials on patient's individual exercise prescription       Expected Outcomes Short Term: Able to explain program exercise prescription;Long Term: Able to explain home exercise prescription to exercise independently                Exercise Goals Re-Evaluation :    Discharge Exercise Prescription (Final Exercise Prescription Changes):   Nutrition:  Target Goals: Understanding of nutrition guidelines, daily intake of sodium 1500mg , cholesterol 200mg , calories 30% from fat and 7% or less from saturated fats, daily to have 5 or more servings of fruits and vegetables.  Biometrics:  Pre Biometrics - 10/18/22 1557  Pre Biometrics   Height 5\' 9"  (1.753 m)    Weight 93 kg    Waist Circumference 42 inches    Hip Circumference 44 inches    Waist to Hip Ratio 0.95 %    BMI (Calculated) 30.26    Triceps Skinfold 15 mm    % Body Fat 29.5 %    Grip Strength 22.3 kg    Flexibility 0 in    Single Leg Stand 0 seconds               Nutrition Therapy Plan and Nutrition Goals:  Nutrition Therapy & Goals - 10/17/22 1009       Intervention Plan   Intervention Prescribe, educate and counsel regarding individualized specific dietary modifications aiming towards targeted core components such as weight, hypertension, lipid management, diabetes, heart failure and other comorbidities.;Nutrition handout(s) given to patient.    Expected Outcomes Short Term Goal: Understand basic principles of dietary content, such as calories, fat, sodium, cholesterol and nutrients.             Nutrition Assessments:  MEDIFICTS Score Key: ?70 Need to make dietary changes  40-70 Heart Healthy Diet ? 40 Therapeutic Level Cholesterol Diet  Flowsheet Row CARDIAC VIRTUAL BASED CARE from 10/17/2022 in [facility] CARDIAC REHABILITATION  Picture Your Plate Total Score on Admission 63      Picture Your Plate Scores: <65 Unhealthy dietary pattern with much room for improvement. 41-50 Dietary pattern unlikely to meet recommendations for good health and room for improvement. 51-60 More healthful dietary pattern, with some room for improvement.  >60 Healthy dietary pattern, although there may be some specific behaviors that could be improved.    Nutrition Goals Re-Evaluation:   Nutrition Goals Discharge (Final Nutrition Goals Re-Evaluation):   Psychosocial: Target Goals: Acknowledge presence or absence of significant depression and/or stress, maximize coping skills, provide positive support system. Participant is able to verbalize types and ability to use techniques and skills needed for reducing stress and depression.  Initial Review & Psychosocial Screening:  Initial Psych Review & Screening - 10/17/22 1011       Initial Review   Current issues with Current Psychotropic Meds;Current Stress Concerns;Current Sleep Concerns    Source of Stress Concerns Chronic Illness;Unable to participate in former interests or hobbies;Unable to  perform yard/household activities;Family    Comments recovering from surgery and feeling weak and unable to go, stressors over kids, wife recently diagnosed with lung cancer, hasn't been able to get out and about as much, uses CPAP at night, sleeps fairly well usually 5-6 hrs      Family Dynamics   Good Support System? Yes   wife, kids     Barriers   Psychosocial barriers to participate in program The patient should benefit from training in stress management and relaxation.;Psychosocial barriers identified (see note)      Screening Interventions   Interventions Encouraged to exercise;To provide support and resources with identified psychosocial needs;Provide feedback about the scores to participant    Expected Outcomes Short Term goal: Utilizing psychosocial counselor, staff and physician to assist with identification of specific Stressors or current issues interfering with healing process. Setting desired goal for each stressor or current issue identified.;Long Term Goal: Stressors or current issues are controlled or eliminated.;Short Term goal: Identification and review with participant of any Quality of Life or Depression concerns found by scoring the questionnaire.;Long Term goal: The participant improves quality of Life and PHQ9 Scores as seen by post scores and/or verbalization of  changes             Quality of Life Scores:  Quality of Life - 10/17/22 1022       Quality of Life   Select Quality of Life      Quality of Life Scores   Health/Function Pre 21.6 %    Socioeconomic Pre 28.13 %    Psych/Spiritual Pre 28.29 %    Family Pre 12 %    GLOBAL Pre 23.06 %            Scores of 19 and below usually indicate a poorer quality of life in these areas.  A difference of  2-3 points is a clinically meaningful difference.  A difference of 2-3 points in the total score of the Quality of Life Index has been associated with significant improvement in overall quality of life,  self-image, physical symptoms, and general health in studies assessing change in quality of life.  PHQ-9: Review Flowsheet       10/18/2022  Depression screen PHQ 2/9  Decreased Interest 0  Down, Depressed, Hopeless 0  PHQ - 2 Score 0  Altered sleeping 0  Tired, decreased energy 0  Change in appetite 0  Feeling bad or failure about yourself  0  Trouble concentrating 0  Moving slowly or fidgety/restless 0  Suicidal thoughts 0  PHQ-9 Score 0    Details           Interpretation of Total Score  Total Score Depression Severity:  1-4 = Minimal depression, 5-9 = Mild depression, 10-14 = Moderate depression, 15-19 = Moderately severe depression, 20-27 = Severe depression   Psychosocial Evaluation and Intervention:  Psychosocial Evaluation - 10/17/22 1024       Psychosocial Evaluation & Interventions   Interventions Stress management education;Relaxation education;Encouraged to exercise with the program and follow exercise prescription    Comments Arthur Wilson is coming into Cardiac Rehab after having a NSTEMI with 3 stents in January.  He was placed on hold for hernia repair surgery in May.  He has been having a hard recovery since surgery and lacking energy and stamina.  He has also noted that his R calf will wake him with pain at night.  His doctor wants him to get moving again.  He wants to work to get back to what he was doing prior to his heart event.  He would like to be able to get out and about to go to store and help around house.  Last week was his first trip to store since surgery, and it fatigued him quickly.  He uses xanan to sleep at night on occassion to calm his mind as he has lots of family stressors between wife and kids.  He states his kids have not bad the best choices and his wife was diagnoisised with lung cancer three years ago and her med was to extend her life for 5 years.  She is doing well, but he worries about her and long term for both of them.  He has no barriers  to attending rehab.  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  He is generally a happy and positive man and tries not to dewell on the negative side of things or let things beyond his control bother him too much.    Expected Outcomes Short: Attend rehab to build up stamina Long: Continue to focus on the positive    Continue Psychosocial Services  Follow up required by staff  Psychosocial Re-Evaluation:   Psychosocial Discharge (Final Psychosocial Re-Evaluation):   Vocational Rehabilitation: Provide vocational rehab assistance to qualifying candidates.   Vocational Rehab Evaluation & Intervention:  Vocational Rehab - 10/17/22 1002       Initial Vocational Rehab Evaluation & Intervention   Assessment shows need for Vocational Rehabilitation No   already back to work as pharmacist            Education: Education Goals: Education classes will be provided on a weekly basis, covering required topics. Participant will state understanding/return demonstration of topics presented.  Learning Barriers/Preferences:  Learning Barriers/Preferences - 10/17/22 1009       Learning Barriers/Preferences   Learning Barriers Sight   glasses for reading, L eye lens implant   Learning Preferences Skilled Demonstration;Written Material             Education Topics: Hypertension, Hypertension Reduction -Define heart disease and high blood pressure. Discus how high blood pressure affects the body and ways to reduce high blood pressure.   Exercise and Your Heart -Discuss why it is important to exercise, the FITT principles of exercise, normal and abnormal responses to exercise, and how to exercise safely.   Angina -Discuss definition of angina, causes of angina, treatment of angina, and how to decrease risk of having angina.   Cardiac Medications -Review what the following cardiac medications are used for, how they affect the body, and side effects that  may occur when taking the medications.  Medications include Aspirin, Beta blockers, calcium channel blockers, ACE Inhibitors, angiotensin receptor blockers, diuretics, digoxin, and antihyperlipidemics.   Congestive Heart Failure -Discuss the definition of CHF, how to live with CHF, the signs and symptoms of CHF, and how keep track of weight and sodium intake.   Heart Disease and Intimacy -Discus the effect sexual activity has on the heart, how changes occur during intimacy as we age, and safety during sexual activity.   Smoking Cessation / COPD -Discuss different methods to quit smoking, the health benefits of quitting smoking, and the definition of COPD.   Nutrition I: Fats -Discuss the types of cholesterol, what cholesterol does to the heart, and how cholesterol levels can be controlled.   Nutrition II: Labels -Discuss the different components of food labels and how to read food label   Heart Parts/Heart Disease and PAD -Discuss the anatomy of the heart, the pathway of blood circulation through the heart, and these are affected by heart disease.   Stress I: Signs and Symptoms -Discuss the causes of stress, how stress may lead to anxiety and depression, and ways to limit stress.   Stress II: Relaxation -Discuss different types of relaxation techniques to limit stress.   Warning Signs of Stroke / TIA -Discuss definition of a stroke, what the signs and symptoms are of a stroke, and how to identify when someone is having stroke.   Knowledge Questionnaire Score:  Knowledge Questionnaire Score - 10/17/22 1024       Knowledge Questionnaire Score   Pre Score 21/26             Core Components/Risk Factors/Patient Goals at Admission:  Personal Goals and Risk Factors at Admission - 10/17/22 1006       Core Components/Risk Factors/Patient Goals on Admission    Weight Management Yes    Intervention Weight Management: Develop a combined nutrition and exercise program  designed to reach desired caloric intake, while maintaining appropriate intake of nutrient and fiber, sodium and fats, and appropriate energy expenditure required for  the weight goal.;Weight Management: Provide education and appropriate resources to help participant work on and attain dietary goals.    Expected Outcomes Short Term: Continue to assess and modify interventions until short term weight is achieved;Long Term: Adherence to nutrition and physical activity/exercise program aimed toward attainment of established weight goal    Diabetes Yes    Intervention Provide education about signs/symptoms and action to take for hypo/hyperglycemia.;Provide education about proper nutrition, including hydration, and aerobic/resistive exercise prescription along with prescribed medications to achieve blood glucose in normal ranges: Fasting glucose 65-99 mg/dL    Expected Outcomes Short Term: Participant verbalizes understanding of the signs/symptoms and immediate care of hyper/hypoglycemia, proper foot care and importance of medication, aerobic/resistive exercise and nutrition plan for blood glucose control.;Long Term: Attainment of HbA1C < 7%.    Hypertension Yes    Intervention Provide education on lifestyle modifcations including regular physical activity/exercise, weight management, moderate sodium restriction and increased consumption of fresh fruit, vegetables, and low fat dairy, alcohol moderation, and smoking cessation.;Monitor prescription use compliance.    Expected Outcomes Long Term: Maintenance of blood pressure at goal levels.;Short Term: Continued assessment and intervention until BP is < 140/4mm HG in hypertensive participants. < 130/11mm HG in hypertensive participants with diabetes, heart failure or chronic kidney disease.    Lipids Yes    Intervention Provide education and support for participant on nutrition & aerobic/resistive exercise along with prescribed medications to achieve LDL 70mg ,  HDL >40mg .    Expected Outcomes Short Term: Participant states understanding of desired cholesterol values and is compliant with medications prescribed. Participant is following exercise prescription and nutrition guidelines.;Long Term: Cholesterol controlled with medications as prescribed, with individualized exercise RX and with personalized nutrition plan. Value goals: LDL < 70mg , HDL > 40 mg.             Core Components/Risk Factors/Patient Goals Review:    Core Components/Risk Factors/Patient Goals at Discharge (Final Review):    ITP Comments:  ITP Comments     Row Name 10/17/22 1155 10/18/22 1525         ITP Comments Completed virtual orientation today.  EP evaluation is scheduled for Wednesday July 24 at 1400.  Documentation for diagnosis can be found in [facility] encounter 03/28/22 and office visit 10/11/22. Patient arrived for 1st visit/orientation/education at 1400. Patient was referred to CR by Nona Dell due to NSTEMI/DES. During orientation advised patient on arrival and appointment times what to wear, what to do before, during and after exercise. Reviewed attendance and class policy.  Pt is scheduled to return Cardiac Rehab on 10/20/22 at 1445. Pt was advised to come to class 15 minutes before class starts.  Discussed RPE/Dpysnea scales. Patient participated in warm up stretches. Patient was able to complete 6 minute walk test.  Telemetry:NSR with PAC's with occasional pauses. Patient was measured for the equipment. Discussed equipment safety with patient. Took patient pre-anthropometric measurements. Patient finished visit at 1515.               ITP Comments     Row Name 10/17/22 1155 10/18/22 1525         ITP Comments Completed virtual orientation today.  EP evaluation is scheduled for Wednesday July 24 at 1400.  Documentation for diagnosis can be found in [facility] encounter 03/28/22 and office visit 10/11/22. Patient arrived for 1st visit/orientation/education at 1400.  Patient was referred to CR by Nona Dell due to NSTEMI/DES. During orientation advised patient on arrival and appointment times what to wear,  what to do before, during and after exercise. Reviewed attendance and class policy.  Pt is scheduled to return Cardiac Rehab on 10/20/22 at 1445. Pt was advised to come to class 15 minutes before class starts.  Discussed RPE/Dpysnea scales. Patient participated in warm up stretches. Patient was able to complete 6 minute walk test.  Telemetry:NSR with PAC's with occasional pauses. Patient was measured for the equipment. Discussed equipment safety with patient. Took patient pre-anthropometric measurements. Patient finished visit at 1515.               Comments: Patient arrived for 1st visit/orientation/education at 1400. Patient was referred to CR by Nona Dell due to NSTEMI/DES. During orientation advised patient on arrival and appointment times what to wear, what to do before, during and after exercise. Reviewed attendance and class policy.  Pt is scheduled to return Cardiac Rehab on 10/20/22 at 1445. Pt was advised to come to class 15 minutes before class starts.  Discussed RPE/Dpysnea scales. Patient participated in warm up stretches. Patient was able to complete 6 minute walk test.  Telemetry:NSR with PAC's with occasional pauses. Patient was measured for the equipment. Discussed equipment safety with patient. Took patient pre-anthropometric measurements. Patient finished visit at 1515.

## 2022-10-20 ENCOUNTER — Encounter: Payer: Self-pay | Admitting: *Deleted

## 2022-10-20 ENCOUNTER — Encounter (HOSPITAL_COMMUNITY)
Admission: RE | Admit: 2022-10-20 | Discharge: 2022-10-20 | Disposition: A | Discharge status: Home / Self Care | Payer: Medicare Other | Source: Ambulatory Visit | Attending: Cardiology | Admitting: Cardiology

## 2022-10-20 ENCOUNTER — Encounter (HOSPITAL_COMMUNITY): Payer: Medicare Other

## 2022-10-20 DIAGNOSIS — I214 Non-ST elevation (NSTEMI) myocardial infarction: Secondary | ICD-10-CM | POA: Diagnosis not present

## 2022-10-20 DIAGNOSIS — Z955 Presence of coronary angioplasty implant and graft: Secondary | ICD-10-CM | POA: Diagnosis not present

## 2022-10-20 NOTE — Progress Notes (Signed)
Daily Session Note  Patient Details  Name: Arthur Wilson MRN: [id number] Date of Birth: 1945/08/03 Referring Provider:   Flowsheet Row CARDIAC REHAB PHASE II ORIENTATION from 10/18/2022 in [facility] CARDIAC REHABILITATION  Referring Provider Dr. Jomarie Longs       Encounter Date: 10/20/2022  Check In:  Session Check In - 10/20/22 1439       Check-In   Supervising physician immediately available to respond to emergencies [facility] MD immediately available    Physician(s) Dr. Dina Rich    Location [facility]    Staff Present Rodena Medin, RN, Pleas Koch, RN, BSN;Heather Fredric Mare, BS, Exercise Physiologist    Virtual Visit No    Medication changes reported     No    Fall or balance concerns reported    Yes    Comments History of falls; unsteady gait    Warm-up and Cool-down Performed as group-led instruction    Resistance Training Performed Yes    VAD Patient? No    PAD/SET Patient? No      Pain Assessment   Currently in Pain? No/denies    Multiple Pain Sites No             Capillary Blood Glucose: No results found for this or any previous visit (from the past 24 hour(s)).    Social History   Tobacco Use  Smoking Status Never  Smokeless Tobacco Never    Goals Met:  Independence with exercise equipment Exercise tolerated well No report of concerns or symptoms today Strength training completed today  Goals Unmet:  Not Applicable  Comments: Check out 1545.   Dr. Dina Rich is Medical Director for [facility] Cardiac Rehab

## 2022-10-23 ENCOUNTER — Encounter (HOSPITAL_COMMUNITY)
Admission: RE | Admit: 2022-10-23 | Discharge: 2022-10-23 | Disposition: A | Discharge status: Home / Self Care | Payer: Medicare Other | Source: Ambulatory Visit | Attending: Cardiology | Admitting: Cardiology

## 2022-10-23 DIAGNOSIS — Z955 Presence of coronary angioplasty implant and graft: Secondary | ICD-10-CM | POA: Diagnosis not present

## 2022-10-23 DIAGNOSIS — I214 Non-ST elevation (NSTEMI) myocardial infarction: Secondary | ICD-10-CM

## 2022-10-23 NOTE — Progress Notes (Signed)
Daily Session Note  Patient Details  Name: Arthur Wilson MRN: [id number] Date of Birth: [birth year] Referring Provider:   Flowsheet Row CARDIAC REHAB PHASE II ORIENTATION from 10/18/2022 in [facility] CARDIAC REHABILITATION  Referring Provider Dr. Jomarie Longs       Encounter Date: 10/23/2022  Check In:  Session Check In - 10/23/22 1445       Check-In   Supervising physician immediately available to respond to emergencies [facility] MD immediately available    Physician(s) Dr. Wyline Mood    Location [facility]    Staff Present Ross Ludwig, BS, Exercise Physiologist;Keidra Withers, RN;Daphyne Daphine Deutscher, RN, BSN    Virtual Visit No    Medication changes reported     No    Fall or balance concerns reported    Yes    Comments History of falls; unsteady gait    Tobacco Cessation No Change    Warm-up and Cool-down Performed on first and last piece of equipment    Resistance Training Performed Yes    VAD Patient? No    PAD/SET Patient? No      Pain Assessment   Currently in Pain? No/denies    Multiple Pain Sites No             Capillary Blood Glucose: No results found for this or any previous visit (from the past 24 hour(s)).    Social History   Tobacco Use  Smoking Status Never  Smokeless Tobacco Never    Goals Met:  Independence with exercise equipment Exercise tolerated well No report of concerns or symptoms today Strength training completed today  Goals Unmet:  Not Applicable  Comments: Pt able to follow exercise prescription today without complaint.  Will continue to monitor for progression.    Dr. Dina Rich is Medical Director for [facility] Cardiac Rehab

## 2022-10-25 ENCOUNTER — Encounter (HOSPITAL_COMMUNITY)
Admission: RE | Admit: 2022-10-25 | Discharge: 2022-10-25 | Disposition: A | Discharge status: Home / Self Care | Payer: Medicare Other | Source: Ambulatory Visit | Attending: Cardiology | Admitting: Cardiology

## 2022-10-25 DIAGNOSIS — I214 Non-ST elevation (NSTEMI) myocardial infarction: Secondary | ICD-10-CM

## 2022-10-25 DIAGNOSIS — Z955 Presence of coronary angioplasty implant and graft: Secondary | ICD-10-CM

## 2022-10-25 NOTE — Progress Notes (Signed)
Daily Session Note  Patient Details  Name: Arthur Wilson MRN: [id number] Date of Birth: 26-Jul-1945 Referring Provider:   Flowsheet Row CARDIAC REHAB PHASE II ORIENTATION from 10/18/2022 in [facility] CARDIAC REHABILITATION  Referring Provider Dr. Jomarie Longs       Encounter Date: 10/25/2022  Check In:  Session Check In - 10/25/22 1446       Check-In   Supervising physician immediately available to respond to emergencies See telemetry face sheet for immediately available MD    Physician(s) Dr. Wyline Mood    Location [facility]    Staff Present Ross Ludwig, BS, Exercise Physiologist;Hillary Troutman BSN, RN;Daphyne Daphine Deutscher, RN, BSN    Virtual Visit No    Medication changes reported     No    Fall or balance concerns reported    Yes    Comments History of falls; unsteady gait    Tobacco Cessation No Change    Warm-up and Cool-down Performed on first and last piece of equipment    Resistance Training Performed Yes    VAD Patient? No    PAD/SET Patient? No      Pain Assessment   Currently in Pain? No/denies    Multiple Pain Sites No             Capillary Blood Glucose: No results found for this or any previous visit (from the past 24 hour(s)).    Social History   Tobacco Use  Smoking Status Never  Smokeless Tobacco Never    Goals Met:  Independence with exercise equipment Exercise tolerated well No report of concerns or symptoms today Strength training completed today  Goals Unmet:  Not Applicable  Comments: Pt able to follow exercise prescription today without complaint.  Will continue to monitor for progression.    Dr. Dina Rich is Medical Director for [facility] Cardiac Rehab

## 2022-10-26 DIAGNOSIS — E119 Type 2 diabetes mellitus without complications: Secondary | ICD-10-CM | POA: Diagnosis not present

## 2022-10-27 ENCOUNTER — Encounter (HOSPITAL_COMMUNITY)
Admission: RE | Admit: 2022-10-27 | Discharge: 2022-10-27 | Disposition: A | Discharge status: Home / Self Care | Payer: Medicare Other | Source: Ambulatory Visit | Attending: Cardiology | Admitting: Cardiology

## 2022-10-27 ENCOUNTER — Encounter (HOSPITAL_COMMUNITY): Payer: Medicare Other

## 2022-10-27 DIAGNOSIS — I214 Non-ST elevation (NSTEMI) myocardial infarction: Secondary | ICD-10-CM | POA: Insufficient documentation

## 2022-10-27 DIAGNOSIS — Z955 Presence of coronary angioplasty implant and graft: Secondary | ICD-10-CM | POA: Diagnosis not present

## 2022-10-27 NOTE — Progress Notes (Signed)
Daily Session Note  Patient Details  Name: Arthur Wilson MRN: [id number] Date of Birth: [birth year] Referring Provider:   Flowsheet Row CARDIAC REHAB PHASE II ORIENTATION from 10/18/2022 in [facility] CARDIAC REHABILITATION  Referring Provider Dr. Jomarie Longs       Encounter Date: 10/27/2022  Check In:  Session Check In - 10/27/22 1436       Check-In   Supervising physician immediately available to respond to emergencies See telemetry face sheet for immediately available MD    Location [facility]    Staff Present Ross Ludwig, BS, Exercise Physiologist;Phyllis Billingsley, RN;Earle Burson Daphine Deutscher, RN, BSN    Virtual Visit No    Medication changes reported     No    Fall or balance concerns reported    Yes    Comments History of falls; unsteady gait    Tobacco Cessation No Change    Warm-up and Cool-down Performed on first and last piece of equipment    VAD Patient? No      Pain Assessment   Currently in Pain? No/denies             Capillary Blood Glucose: No results found for this or any previous visit (from the past 24 hour(s)).    Social History   Tobacco Use  Smoking Status Never  Smokeless Tobacco Never    Goals Met:  Independence with exercise equipment Exercise tolerated well No report of concerns or symptoms today Strength training completed today  Goals Unmet:  Not Applicable  Comments: Pt able to follow exercise prescription today without complaint.  Will continue to monitor for progression.    Dr. Dina Rich is Medical Director for [facility] Cardiac Rehab

## 2022-10-30 ENCOUNTER — Encounter (HOSPITAL_COMMUNITY)
Admission: RE | Admit: 2022-10-30 | Discharge: 2022-10-30 | Disposition: A | Discharge status: Home / Self Care | Payer: Medicare Other | Source: Ambulatory Visit | Attending: Cardiology | Admitting: Cardiology

## 2022-10-30 DIAGNOSIS — I214 Non-ST elevation (NSTEMI) myocardial infarction: Secondary | ICD-10-CM | POA: Diagnosis not present

## 2022-10-30 DIAGNOSIS — Z955 Presence of coronary angioplasty implant and graft: Secondary | ICD-10-CM

## 2022-10-30 NOTE — Progress Notes (Signed)
Daily Session Note  Patient Details  Name: Arthur Wilson MRN: [id number] Date of Birth: 03/09/1946 Referring Provider:   Flowsheet Row CARDIAC REHAB PHASE II ORIENTATION from 10/18/2022 in [facility] CARDIAC REHABILITATION  Referring Provider Dr. Jomarie Longs       Encounter Date: 10/30/2022  Check In:  Session Check In - 10/30/22 1507       Check-In   Supervising physician immediately available to respond to emergencies See telemetry face sheet for immediately available MD    Location [facility]    Staff Present Erskine Speed, RN;Karryn Kosinski Juanetta Gosling, MA, RCEP, CCRP, CCET    Virtual Visit No    Medication changes reported     No    Warm-up and Cool-down Performed on first and last piece of equipment    Resistance Training Performed Yes    VAD Patient? No    PAD/SET Patient? No      Pain Assessment   Currently in Pain? No/denies             Capillary Blood Glucose: No results found for this or any previous visit (from the past 24 hour(s)).    Social History   Tobacco Use  Smoking Status Never  Smokeless Tobacco Never    Goals Met:  Independence with exercise equipment Exercise tolerated well No report of concerns or symptoms today Strength training completed today  Goals Unmet:  Not Applicable  Comments: Pt able to follow exercise prescription today without complaint.  Will continue to monitor for progression.    Dr. Dina Rich is Medical Director for [facility] Cardiac Rehab

## 2022-11-01 ENCOUNTER — Encounter (HOSPITAL_COMMUNITY): Admission: RE | Admit: 2022-11-01 | Payer: Medicare Other | Source: Ambulatory Visit

## 2022-11-01 DIAGNOSIS — I214 Non-ST elevation (NSTEMI) myocardial infarction: Secondary | ICD-10-CM | POA: Diagnosis not present

## 2022-11-01 DIAGNOSIS — Z955 Presence of coronary angioplasty implant and graft: Secondary | ICD-10-CM | POA: Diagnosis not present

## 2022-11-01 NOTE — Progress Notes (Signed)
Daily Session Note  Patient Details  Name: Arthur Wilson MRN: [id number] Date of Birth: [birth year] Referring Provider:   Flowsheet Row CARDIAC REHAB PHASE II ORIENTATION from 10/18/2022 in [facility] CARDIAC REHABILITATION  Referring Provider Dr. Jomarie Longs       Encounter Date: 11/01/2022  Check In:  Session Check In - 11/01/22 1445       Check-In   Supervising physician immediately available to respond to emergencies See telemetry face sheet for immediately available MD    Staff Present Fabio Pierce, MA, RCEP, CCRP, CCET;Hillary Troutman BSN, RN;Cohan [place] Christian, RN, BSN    Virtual Visit No    Medication changes reported     No    Fall or balance concerns reported    Yes    Comments History of falls; unsteady gait    Tobacco Cessation No Change    Warm-up and Cool-down Performed on first and last piece of equipment    Resistance Training Performed Yes    VAD Patient? No      Pain Assessment   Currently in Pain? No/denies             Capillary Blood Glucose: No results found for this or any previous visit (from the past 24 hour(s)).    Social History   Tobacco Use  Smoking Status Never  Smokeless Tobacco Never    Goals Met:  Independence with exercise equipment Exercise tolerated well No report of concerns or symptoms today Strength training completed today  Goals Unmet:  Not Applicable  Comments: Pt able to follow exercise prescription today without complaint.  Will continue to monitor for progression.    Dr. Dina Rich is Medical Director for [facility] Cardiac Rehab

## 2022-11-03 ENCOUNTER — Encounter (HOSPITAL_COMMUNITY): Payer: Medicare Other

## 2022-11-03 ENCOUNTER — Encounter (HOSPITAL_COMMUNITY)
Admission: RE | Admit: 2022-11-03 | Discharge: 2022-11-03 | Disposition: A | Discharge status: Home / Self Care | Payer: Medicare Other | Source: Ambulatory Visit | Attending: Cardiology | Admitting: Cardiology

## 2022-11-03 DIAGNOSIS — I214 Non-ST elevation (NSTEMI) myocardial infarction: Secondary | ICD-10-CM

## 2022-11-03 DIAGNOSIS — Z955 Presence of coronary angioplasty implant and graft: Secondary | ICD-10-CM

## 2022-11-03 NOTE — Progress Notes (Signed)
Daily Session Note  Patient Details  Name: Arthur Wilson MRN: [id number] Date of Birth: [birth year] Referring Provider:   Flowsheet Row CARDIAC REHAB PHASE II ORIENTATION from 10/18/2022 in [facility] CARDIAC REHABILITATION  Referring Provider Dr. Jomarie Longs       Encounter Date: 11/03/2022  Check In:  Session Check In - 11/03/22 1445       Check-In   Supervising physician immediately available to respond to emergencies See telemetry face sheet for immediately available ER MD    Location [facility]    Staff Present Rodena Medin, RN, Pleas Koch, RN, BSN    Virtual Visit No    Medication changes reported     No    Fall or balance concerns reported    Yes    Comments History of falls; unsteady gait    Warm-up and Cool-down Performed on first and last piece of equipment    Resistance Training Performed Yes    VAD Patient? No    PAD/SET Patient? No      Pain Assessment   Currently in Pain? No/denies    Multiple Pain Sites No             Capillary Blood Glucose: No results found for this or any previous visit (from the past 24 hour(s)).    Social History   Tobacco Use  Smoking Status Never  Smokeless Tobacco Never    Goals Met:  Independence with exercise equipment Exercise tolerated well No report of concerns or symptoms today Strength training completed today  Goals Unmet:  Not Applicable  Comments: Pt able to follow exercise prescription today without complaint.  Will continue to monitor for progression.    Dr. Dina Rich is Medical Director for [facility] Cardiac Rehab

## 2022-11-06 ENCOUNTER — Encounter (HOSPITAL_COMMUNITY)
Admission: RE | Admit: 2022-11-06 | Discharge: 2022-11-06 | Disposition: A | Discharge status: Home / Self Care | Payer: Medicare Other | Source: Ambulatory Visit | Attending: Cardiology | Admitting: Cardiology

## 2022-11-06 DIAGNOSIS — I214 Non-ST elevation (NSTEMI) myocardial infarction: Secondary | ICD-10-CM

## 2022-11-06 DIAGNOSIS — Z955 Presence of coronary angioplasty implant and graft: Secondary | ICD-10-CM

## 2022-11-06 NOTE — Progress Notes (Signed)
Daily Session Note  Patient Details  Name: Arthur Wilson MRN: [id number] Date of Birth: [birth year] Referring Provider:   Flowsheet Row CARDIAC REHAB PHASE II ORIENTATION from 10/18/2022 in [facility] CARDIAC REHABILITATION  Referring Provider Dr. Jomarie Longs       Encounter Date: 11/06/2022  Check In:  Session Check In - 11/06/22 1441       Check-In   Supervising physician immediately available to respond to emergencies See telemetry face sheet for immediately available ER MD    Location [facility]    Staff Present Staci Righter, RN, Neal Dy, RN, BSN;Jessica Juanetta Gosling, MA, RCEP, CCRP, CCET;Heather Fredric Mare, [place], Exercise Physiologist    Virtual Visit No    Medication changes reported     No    Fall or balance concerns reported    Yes    Comments History of falls; unsteady gait    Warm-up and Cool-down Performed on first and last piece of equipment    Resistance Training Performed Yes    VAD Patient? No    PAD/SET Patient? No      Pain Assessment   Currently in Pain? No/denies    Multiple Pain Sites No             Capillary Blood Glucose: No results found for this or any previous visit (from the past 24 hour(s)).    Social History   Tobacco Use  Smoking Status Never  Smokeless Tobacco Never    Goals Met:  Independence with exercise equipment Exercise tolerated well No report of concerns or symptoms today Strength training completed today  Goals Unmet:  Not Applicable  Comments: Pt able to follow exercise prescription today without complaint.  Will continue to monitor for progression.    Dr. Dina Rich is Medical Director for [facility] Cardiac Rehab

## 2022-11-08 ENCOUNTER — Encounter (HOSPITAL_COMMUNITY)
Admission: RE | Admit: 2022-11-08 | Discharge: 2022-11-08 | Disposition: A | Discharge status: Home / Self Care | Payer: Medicare Other | Source: Ambulatory Visit | Attending: Cardiology | Admitting: Cardiology

## 2022-11-08 ENCOUNTER — Encounter (HOSPITAL_COMMUNITY): Payer: Medicare Other

## 2022-11-08 ENCOUNTER — Encounter (HOSPITAL_COMMUNITY): Payer: Self-pay | Admitting: *Deleted

## 2022-11-08 DIAGNOSIS — I214 Non-ST elevation (NSTEMI) myocardial infarction: Secondary | ICD-10-CM

## 2022-11-08 DIAGNOSIS — Z955 Presence of coronary angioplasty implant and graft: Secondary | ICD-10-CM

## 2022-11-08 NOTE — Progress Notes (Signed)
Cardiac Individual Treatment Plan  Patient Details  Name: Arthur Wilson MRN: [id number] Date of Birth: [birth year] Referring Provider:   Flowsheet Row CARDIAC REHAB PHASE II ORIENTATION from 10/18/2022 in [facility] CARDIAC REHABILITATION  Referring Provider Dr. Jomarie Longs       Initial Encounter Date:  Flowsheet Row CARDIAC REHAB PHASE II ORIENTATION from 10/18/2022 in [place] CARDIAC REHABILITATION  Date 10/18/22       Visit Diagnosis: NSTEMI (non-ST elevated myocardial infarction) [facility])  Status post coronary artery stent placement  Patient's Home Medications on Admission:  Current Outpatient Medications:    acetaminophen (TYLENOL) 500 MG tablet, Take 1,000 mg by mouth every 6 (six) hours as needed for moderate pain., Disp: , Rfl:    ALPRAZolam (XANAX) 0.5 MG tablet, Take 0.5 tablets by mouth at bedtime as needed for anxiety or sleep., Disp: , Rfl:    atorvastatin (LIPITOR) 40 MG tablet, Take 1 tablet (40 mg total) by mouth daily., Disp: 30 tablet, Rfl: 0   Continuous Blood Gluc Sensor (DEXCOM G7 SENSOR) MISC, for 30 Days, Disp: , Rfl:    ELIQUIS 5 MG TABS tablet, Take 5 mg by mouth 2 (two) times daily., Disp: , Rfl:    empagliflozin (JARDIANCE) 10 MG TABS tablet, Take 1 tablet (10 mg total) by mouth daily. (Patient taking differently: Take 5 mg by mouth daily.), Disp: 30 tablet, Rfl: 0   fluticasone (FLONASE) 50 MCG/ACT nasal spray, Place 1 spray into both nostrils daily as needed for allergies., Disp: , Rfl:    furosemide (LASIX) 40 MG tablet, 1 tablet Oral once a week for 30 days, Disp: , Rfl:    HYDROcodone-acetaminophen (NORCO) 7.5-325 MG tablet, Take 0.5-1 tablets by mouth 3 (three) times daily as needed for moderate pain., Disp: , Rfl:    HYDROcodone-acetaminophen (NORCO/VICODIN) 5-325 MG tablet, Take 1 tablet by mouth every 6 (six) hours as needed for moderate pain., Disp: 20 tablet, Rfl: 0   metFORMIN (GLUCOPHAGE) 500 MG tablet, Take 500 mg by mouth at bedtime. (Patient  not taking: Reported on 10/11/2022), Disp: , Rfl:    pantoprazole (PROTONIX) 40 MG tablet, Take 1 tablet (40 mg total) by mouth daily at 12 noon., Disp: 30 tablet, Rfl: 0  Past Medical History: Past Medical History:  Diagnosis Date   Atrial fibrillation (HCC)    BPH (benign prostatic hyperplasia)    Cervical spondylosis without myelopathy    Coronary artery disease    a. s/p NSTEMI in 03/2022 with DESx2 to RCA and staged orbital atherectomy/DES placement to proximal LAD   Diabetes mellitus without complication (HCC)    Essential hypertension    History of kidney stones    showed on CT scan   Impaired glucose tolerance    Inguinal hernia bilateral, non-recurrent    Myocardial infarction (HCC) 03/30/2022   OSA on CPAP    Testicular hypofunction     Tobacco Use: Social History   Tobacco Use  Smoking Status Never  Smokeless Tobacco Never    Labs: Review Flowsheet       Latest Ref Rng & Units 03/29/2022 03/30/2022 08/10/2022  Labs for ITP Cardiac and Pulmonary Rehab  Hemoglobin A1c 4.8 - 5.6 % 7.4  - 6.0   PH, Arterial 7.35 - 7.45 - 7.459  7.501  -  PCO2 arterial 32 - 48 mmHg - 42.9  38.9  -  Bicarbonate 20.0 - 28.0 mmol/L - 31.1  30.4  30.4  -  TCO2 22 - 32 mmol/L - 32  32  32  -  O2 Saturation % - 54  60  90  -    Details       Multiple values from one day are sorted in reverse-chronological order         Capillary Blood Glucose: Lab Results  Component Value Date   GLUCAP 120 (H) 08/17/2022   GLUCAP 120 (H) 08/17/2022   GLUCAP 101 (H) 08/17/2022   GLUCAP 132 (H) 08/10/2022   GLUCAP 118 (H) 04/04/2022     Exercise Target Goals: Exercise Program Goal: Individual exercise prescription set using results from initial 6 min walk test and THRR while considering  patient's activity barriers and safety.   Exercise Prescription Goal: Starting with aerobic activity 30 plus minutes a day, 3 days per week for initial exercise prescription. Provide home exercise  prescription and guidelines that participant acknowledges understanding prior to discharge.  Activity Barriers & Risk Stratification:  Activity Barriers & Cardiac Risk Stratification - 10/17/22 0945       Activity Barriers & Cardiac Risk Stratification   Activity Barriers Deconditioning;Muscular Weakness;Other (comment);Joint Problems;History of Falls;Incisional Pain;Back Problems    Comments two hernia repair surgeries in May 2024 still weak from being out for , knee pain from fall last year not sure whats wrong, calf on left leg cramping/painful, occassional low back pain, spndylosis    Cardiac Risk Stratification Moderate             6 Minute Walk:  6 Minute Walk     Row Name 10/18/22 1552         6 Minute Walk   Phase Initial     Distance 800 feet     Walk Time 6 minutes     # of Rest Breaks 0     MPH 1.51     METS 1.59     RPE 12     VO2 Peak 5.59     Symptoms No     Resting HR 80 bpm     Resting BP 136/68     Resting Oxygen Saturation  94 %     Exercise Oxygen Saturation  during 6 min walk 94 %     Max Ex. HR 93 bpm     Max Ex. BP 130/68     2 Minute Post BP 132/68              Oxygen Initial Assessment:   Oxygen Re-Evaluation:   Oxygen Discharge (Final Oxygen Re-Evaluation):   Initial Exercise Prescription:  Initial Exercise Prescription - 10/18/22 1500       Date of Initial Exercise RX and Referring Provider   Date 10/18/22    Referring Provider Dr. Jomarie Longs      Oxygen   Maintain Oxygen Saturation 88% or higher      Treadmill   MPH 1.5    Grade 0    Minutes 15      NuStep   Level 1    SPM 80    Minutes 15      Prescription Details   Frequency (times per week) 3    Duration Progress to 30 minutes of continuous aerobic without signs/symptoms of physical distress      Intensity   THRR 40-80% of Max Heartrate 106-133    Ratings of Perceived Exertion 11-13    Perceived Dyspnea 0-4      Resistance Training   Training  Prescription Yes    Weight 3    Reps 10-15  Perform Capillary Blood Glucose checks as needed.  Exercise Prescription Changes:   Exercise Prescription Changes     Row Name 10/23/22 1500             Response to Exercise   Blood Pressure (Admit) 122/60       Blood Pressure (Exercise) 128/58       Blood Pressure (Exit) 120/60       Heart Rate (Admit) 69 bpm       Heart Rate (Exercise) 94 bpm       Heart Rate (Exit) 74 bpm       Rating of Perceived Exertion (Exercise) 12       Duration Continue with 30 min of aerobic exercise without signs/symptoms of physical distress.       Intensity THRR unchanged         Progression   Progression Continue to progress workloads to maintain intensity without signs/symptoms of physical distress.         Resistance Training   Training Prescription Yes       Weight 3       Reps 10-15         Treadmill   MPH 1.5       Grade 0       Minutes 15       METs 2.15         NuStep   Level 2       SPM 79       Minutes 15       METs 1.8         Oxygen   Maintain Oxygen Saturation 88% or higher                Exercise Comments:   Exercise Goals and Review:   Exercise Goals     Row Name 10/18/22 1556             Exercise Goals   Increase Physical Activity Yes       Intervention Provide advice, education, support and counseling about physical activity/exercise needs.;Develop an individualized exercise prescription for aerobic and resistive training based on initial evaluation findings, risk stratification, comorbidities and participant's personal goals.       Expected Outcomes Short Term: Attend rehab on a regular basis to increase amount of physical activity.;Long Term: Add in home exercise to make exercise part of routine and to increase amount of physical activity.;Long Term: Exercising regularly at least 3-5 days a week.       Increase Strength and Stamina Yes       Intervention Provide advice, education,  support and counseling about physical activity/exercise needs.;Develop an individualized exercise prescription for aerobic and resistive training based on initial evaluation findings, risk stratification, comorbidities and participant's personal goals.       Expected Outcomes Short Term: Increase workloads from initial exercise prescription for resistance, speed, and METs.;Short Term: Perform resistance training exercises routinely during rehab and add in resistance training at home;Long Term: Improve cardiorespiratory fitness, muscular endurance and strength as measured by increased METs and functional capacity ( )       Able to understand and use rate of perceived exertion (RPE) scale Yes       Intervention Provide education and explanation on how to use RPE scale       Expected Outcomes Short Term: Able to use RPE daily in rehab to express subjective intensity level;Long Term:  Able to use RPE to guide intensity level when exercising independently  Knowledge and understanding of Target Heart Rate Range (THRR) Yes       Intervention Provide education and explanation of THRR including how the numbers were predicted and where they are located for reference       Expected Outcomes Short Term: Able to state/look up THRR;Long Term: Able to use THRR to govern intensity when exercising independently;Short Term: Able to use daily as guideline for intensity in rehab       Able to check pulse independently Yes       Intervention Provide education and demonstration on how to check pulse in carotid and radial arteries.;Review the importance of being able to check your own pulse for safety during independent exercise       Expected Outcomes Short Term: Able to explain why pulse checking is important during independent exercise;Long Term: Able to check pulse independently and accurately       Understanding of Exercise Prescription Yes       Intervention Provide education, explanation, and written materials  on patient's individual exercise prescription       Expected Outcomes Short Term: Able to explain program exercise prescription;Long Term: Able to explain home exercise prescription to exercise independently                Exercise Goals Re-Evaluation :  Exercise Goals Re-Evaluation     Row Name 10/24/22 [phone]             Exercise Goal Re-Evaluation   Comments Pt has just started rehab, he is tolerating exercise well and is increase his SPM each class                 Discharge Exercise Prescription (Final Exercise Prescription Changes):  Exercise Prescription Changes - 10/23/22 1500       Response to Exercise   Blood Pressure (Admit) 122/60    Blood Pressure (Exercise) 128/58    Blood Pressure (Exit) 120/60    Heart Rate (Admit) 69 bpm    Heart Rate (Exercise) 94 bpm    Heart Rate (Exit) 74 bpm    Rating of Perceived Exertion (Exercise) 12    Duration Continue with 30 min of aerobic exercise without signs/symptoms of physical distress.    Intensity THRR unchanged      Progression   Progression Continue to progress workloads to maintain intensity without signs/symptoms of physical distress.      Resistance Training   Training Prescription Yes    Weight 3    Reps 10-15      Treadmill   MPH 1.5    Grade 0    Minutes 15    METs 2.15      NuStep   Level 2    SPM 79    Minutes 15    METs 1.8      Oxygen   Maintain Oxygen Saturation 88% or higher             Nutrition:  Target Goals: Understanding of nutrition guidelines, daily intake of sodium 1500mg , cholesterol 200mg , calories 30% from fat and 7% or less from saturated fats, daily to have 5 or more servings of fruits and vegetables.  Biometrics:  Pre Biometrics - 10/18/22 1557       Pre Biometrics   Height 5\' 9"  (1.753 m)    Weight 205 lb 0.4 oz (93 kg)    Waist Circumference 42 inches    Hip Circumference 44 inches    Waist to Hip Ratio 0.95 %  BMI (Calculated) 30.26    Triceps  Skinfold 15 mm    % Body Fat 29.5 %    Grip Strength 22.3 kg    Flexibility 0 in    Single Leg Stand 0 seconds              Nutrition Therapy Plan and Nutrition Goals:  Nutrition Therapy & Goals - 10/17/22 1009       Intervention Plan   Intervention Prescribe, educate and counsel regarding individualized specific dietary modifications aiming towards targeted core components such as weight, hypertension, lipid management, diabetes, heart failure and other comorbidities.;Nutrition handout(s) given to patient.    Expected Outcomes Short Term Goal: Understand basic principles of dietary content, such as calories, fat, sodium, cholesterol and nutrients.             Nutrition Assessments:  MEDIFICTS Score Key: ?70 Need to make dietary changes  40-70 Heart Healthy Diet ? 40 Therapeutic Level Cholesterol Diet  Flowsheet Row CARDIAC VIRTUAL BASED CARE from 10/17/2022 in [facility] CARDIAC REHABILITATION  Picture Your Plate Total Score on Admission 63      Picture Your Plate Scores: <08 Unhealthy dietary pattern with much room for improvement. 41-50 Dietary pattern unlikely to meet recommendations for good health and room for improvement. 51-60 More healthful dietary pattern, with some room for improvement.  >60 Healthy dietary pattern, although there may be some specific behaviors that could be improved.    Nutrition Goals Re-Evaluation:   Nutrition Goals Discharge (Final Nutrition Goals Re-Evaluation):   Psychosocial: Target Goals: Acknowledge presence or absence of significant depression and/or stress, maximize coping skills, provide positive support system. Participant is able to verbalize types and ability to use techniques and skills needed for reducing stress and depression.  Initial Review & Psychosocial Screening:  Initial Psych Review & Screening - 10/17/22 1011       Initial Review   Current issues with Current Psychotropic Meds;Current Stress  Concerns;Current Sleep Concerns    Source of Stress Concerns Chronic Illness;Unable to participate in former interests or hobbies;Unable to perform yard/household activities;Family    Comments recovering from surgery and feeling weak and unable to go, stressors over kids, wife recently diagnosed with lung cancer, hasn't been able to get out and about as much, uses CPAP at night, sleeps fairly well usually 5-6 hrs      Family Dynamics   Good Support System? Yes   wife, kids     Barriers   Psychosocial barriers to participate in program The patient should benefit from training in stress management and relaxation.;Psychosocial barriers identified (see note)      Screening Interventions   Interventions Encouraged to exercise;To provide support and resources with identified psychosocial needs;Provide feedback about the scores to participant    Expected Outcomes Short Term goal: Utilizing psychosocial counselor, staff and physician to assist with identification of specific Stressors or current issues interfering with healing process. Setting desired goal for each stressor or current issue identified.;Long Term Goal: Stressors or current issues are controlled or eliminated.;Short Term goal: Identification and review with participant of any Quality of Life or Depression concerns found by scoring the questionnaire.;Long Term goal: The participant improves quality of Life and PHQ9 Scores as seen by post scores and/or verbalization of changes             Quality of Life Scores:  Quality of Life - 10/17/22 1022       Quality of Life   Select Quality of Life  Quality of Life Scores   Health/Function Pre 21.6 %    Socioeconomic Pre 28.13 %    Psych/Spiritual Pre 28.29 %    Family Pre 12 %    GLOBAL Pre 23.06 %            Scores of 19 and below usually indicate a poorer quality of life in these areas.  A difference of  2-3 points is a clinically meaningful difference.  A difference of 2-3  points in the total score of the Quality of Life Index has been associated with significant improvement in overall quality of life, self-image, physical symptoms, and general health in studies assessing change in quality of life.  PHQ-9: Review Flowsheet       10/18/2022  Depression screen PHQ 2/9  Decreased Interest 0  Down, Depressed, Hopeless 0  PHQ - 2 Score 0  Altered sleeping 0  Tired, decreased energy 0  Change in appetite 0  Feeling bad or failure about yourself  0  Trouble concentrating 0  Moving slowly or fidgety/restless 0  Suicidal thoughts 0  PHQ-9 Score 0    Details           Interpretation of Total Score  Total Score Depression Severity:  1-4 = Minimal depression, 5-9 = Mild depression, 10-14 = Moderate depression, 15-19 = Moderately severe depression, 20-27 = Severe depression   Psychosocial Evaluation and Intervention:  Psychosocial Evaluation - 10/17/22 1024       Psychosocial Evaluation & Interventions   Interventions Stress management education;Relaxation education;Encouraged to exercise with the program and follow exercise prescription    Comments Dev is coming into Cardiac Rehab after having a NSTEMI with 3 stents in January.  He was placed on hold for hernia repair surgery in May.  He has been having a hard recovery since surgery and lacking energy and stamina.  He has also noted that his R calf will wake him with pain at night.  His doctor wants him to get moving again.  He wants to work to get back to what he was doing prior to his heart event.  He would like to be able to get out and about to go to store and help around house.  Last week was his first trip to store since surgery, and it fatigued him quickly.  He uses xanan to sleep at night on occassion to calm his mind as he has lots of family stressors between wife and kids.  He states his kids have not bad the best choices and his wife was diagnoisised with lung cancer three years ago and her med  was to extend her life for 5 years.  She is doing well, but he worries about her and long term for both of them.  He has no barriers to attending rehab.  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  He is generally a happy and positive man and tries not to dewell on the negative side of things or let things beyond his control bother him too much.    Expected Outcomes Short: Attend rehab to build up stamina Long: Continue to focus on the positive    Continue Psychosocial Services  Follow up required by staff             Psychosocial Re-Evaluation:   Psychosocial Discharge (Final Psychosocial Re-Evaluation):   Vocational Rehabilitation: Provide vocational rehab assistance to qualifying candidates.   Vocational Rehab Evaluation & Intervention:  Vocational Rehab - 10/17/22 1002  Initial Vocational Rehab Evaluation & Intervention   Assessment shows need for Vocational Rehabilitation No   already back to work as pharmacist            Education: Education Goals: Education classes will be provided on a weekly basis, covering required topics. Participant will state understanding/return demonstration of topics presented.  Learning Barriers/Preferences:  Learning Barriers/Preferences - 10/17/22 1009       Learning Barriers/Preferences   Learning Barriers Sight   glasses for reading, L eye lens implant   Learning Preferences Skilled Demonstration;Written Material             Education Topics: Hypertension, Hypertension Reduction -Define heart disease and high blood pressure. Discus how high blood pressure affects the body and ways to reduce high blood pressure.   Exercise and Your Heart -Discuss why it is important to exercise, the FITT principles of exercise, normal and abnormal responses to exercise, and how to exercise safely.   Angina -Discuss definition of angina, causes of angina, treatment of angina, and how to decrease risk of having  angina.   Cardiac Medications -Review what the following cardiac medications are used for, how they affect the body, and side effects that may occur when taking the medications.  Medications include Aspirin, Beta blockers, calcium channel blockers, ACE Inhibitors, angiotensin receptor blockers, diuretics, digoxin, and antihyperlipidemics.   Congestive Heart Failure -Discuss the definition of CHF, how to live with CHF, the signs and symptoms of CHF, and how keep track of weight and sodium intake. Flowsheet Row CARDIAC REHAB PHASE II EXERCISE from 11/01/2022 in [place] CARDIAC REHABILITATION  Date 10/25/22  Educator HB  Instruction Review Code 1- Verbalizes Understanding       Heart Disease and Intimacy -Discus the effect sexual activity has on the heart, how changes occur during intimacy as we age, and safety during sexual activity. Flowsheet Row CARDIAC REHAB PHASE II EXERCISE from 11/01/2022 in [place] CARDIAC REHABILITATION  Date 11/01/22  Educator [facility]  Instruction Review Code 1- Verbalizes Understanding       Smoking Cessation / COPD -Discuss different methods to quit smoking, the health benefits of quitting smoking, and the definition of COPD.   Nutrition I: Fats -Discuss the types of cholesterol, what cholesterol does to the heart, and how cholesterol levels can be controlled.   Nutrition II: Labels -Discuss the different components of food labels and how to read food label   Heart Parts/Heart Disease and PAD -Discuss the anatomy of the heart, the pathway of blood circulation through the heart, and these are affected by heart disease.   Stress I: Signs and Symptoms -Discuss the causes of stress, how stress may lead to anxiety and depression, and ways to limit stress.   Stress II: Relaxation -Discuss different types of relaxation techniques to limit stress.   Warning Signs of Stroke / TIA -Discuss definition of a stroke, what the signs and symptoms are of a  stroke, and how to identify when someone is having stroke.   Knowledge Questionnaire Score:  Knowledge Questionnaire Score - 10/17/22 1024       Knowledge Questionnaire Score   Pre Score 21/26             Core Components/Risk Factors/Patient Goals at Admission:  Personal Goals and Risk Factors at Admission - 10/17/22 1006       Core Components/Risk Factors/Patient Goals on Admission    Weight Management Yes    Intervention Weight Management: Develop a combined nutrition and exercise program  designed to reach desired caloric intake, while maintaining appropriate intake of nutrient and fiber, sodium and fats, and appropriate energy expenditure required for the weight goal.;Weight Management: Provide education and appropriate resources to help participant work on and attain dietary goals.    Expected Outcomes Short Term: Continue to assess and modify interventions until short term weight is achieved;Long Term: Adherence to nutrition and physical activity/exercise program aimed toward attainment of established weight goal    Diabetes Yes    Intervention Provide education about signs/symptoms and action to take for hypo/hyperglycemia.;Provide education about proper nutrition, including hydration, and aerobic/resistive exercise prescription along with prescribed medications to achieve blood glucose in normal ranges: Fasting glucose 65-99 mg/dL    Expected Outcomes Short Term: Participant verbalizes understanding of the signs/symptoms and immediate care of hyper/hypoglycemia, proper foot care and importance of medication, aerobic/resistive exercise and nutrition plan for blood glucose control.;Long Term: Attainment of HbA1C < 7%.    Hypertension Yes    Intervention Provide education on lifestyle modifcations including regular physical activity/exercise, weight management, moderate sodium restriction and increased consumption of fresh fruit, vegetables, and low fat dairy, alcohol moderation, and  smoking cessation.;Monitor prescription use compliance.    Expected Outcomes Long Term: Maintenance of blood pressure at goal levels.;Short Term: Continued assessment and intervention until BP is < 140/39mm HG in hypertensive participants. < 130/8mm HG in hypertensive participants with diabetes, heart failure or chronic kidney disease.    Lipids Yes    Intervention Provide education and support for participant on nutrition & aerobic/resistive exercise along with prescribed medications to achieve LDL 70mg , HDL >40mg .    Expected Outcomes Short Term: Participant states understanding of desired cholesterol values and is compliant with medications prescribed. Participant is following exercise prescription and nutrition guidelines.;Long Term: Cholesterol controlled with medications as prescribed, with individualized exercise RX and with personalized nutrition plan. Value goals: LDL < 70mg , HDL > 40 mg.             Core Components/Risk Factors/Patient Goals Review:    Core Components/Risk Factors/Patient Goals at Discharge (Final Review):    ITP Comments:  ITP Comments     Row Name 10/17/22 1155 10/18/22 1525 11/08/22 0936       ITP Comments Completed virtual orientation today.  EP evaluation is scheduled for Wednesday July 24 at 1400.  Documentation for diagnosis can be found in [facility] encounter 03/28/22 and office visit 10/11/22. Patient arrived for 1st visit/orientation/education at 1400. Patient was referred to CR by Nona Dell due to NSTEMI/DES. During orientation advised patient on arrival and appointment times what to wear, what to do before, during and after exercise. Reviewed attendance and class policy.  Pt is scheduled to return Cardiac Rehab on 10/20/22 at 1445. Pt was advised to come to class 15 minutes before class starts.  Discussed RPE/Dpysnea scales. Patient participated in warm up stretches. Patient was able to complete 6 minute walk test.  Telemetry:NSR with PAC's with  occasional pauses. Patient was measured for the equipment. Discussed equipment safety with patient. Took patient pre-anthropometric measurements. Patient finished visit at 1515. 30 day review completed. ITP sent to Dr. Dina Rich, Medical Director of Cardiac Rehab. Continue with ITP unless changes are made by physician.              Comments: 30 day review

## 2022-11-08 NOTE — Progress Notes (Signed)
Daily Session Note  Patient Details  Name: Arthur Wilson MRN: [id number] Date of Birth: 1945-12-17 Referring Provider:   Flowsheet Row CARDIAC REHAB PHASE II ORIENTATION from 10/18/2022 in [facility] CARDIAC REHABILITATION  Referring Provider Dr. Jomarie Longs       Encounter Date: 11/08/2022  Check In:  Session Check In - 11/08/22 1400       Check-In   Supervising physician immediately available to respond to emergencies [facility] MD immediately available    Physician(s) Dr. Wyline Mood    Location [facility]    Staff Present Ross Ludwig, BS, Exercise Physiologist;Traquan Duarte BSN, RN;Daphyne Daphine Deutscher, RN, BSN    Virtual Visit No    Medication changes reported     No    Fall or balance concerns reported    Yes    Comments History of falls; unsteady gait    Tobacco Cessation No Change    Warm-up and Cool-down Performed on first and last piece of equipment    Resistance Training Performed Yes    VAD Patient? No    PAD/SET Patient? No      Pain Assessment   Currently in Pain? No/denies    Multiple Pain Sites No             Capillary Blood Glucose: No results found for this or any previous visit (from the past 24 hour(s)).    Social History   Tobacco Use  Smoking Status Never  Smokeless Tobacco Never    Goals Met:  Independence with exercise equipment Exercise tolerated well No report of concerns or symptoms today Strength training completed today  Goals Unmet:  Not Applicable  Comments: Arthur Wilson KitchenMarland KitchenPt able to follow exercise prescription today without complaint.  Will continue to monitor for progression.     Dr. Dina Rich is Medical Director for [facility] Cardiac Rehab

## 2022-11-10 ENCOUNTER — Encounter (HOSPITAL_COMMUNITY): Payer: Medicare Other

## 2022-11-10 ENCOUNTER — Encounter (HOSPITAL_COMMUNITY): Admission: RE | Admit: 2022-11-10 | Payer: Medicare Other | Source: Ambulatory Visit

## 2022-11-10 DIAGNOSIS — I214 Non-ST elevation (NSTEMI) myocardial infarction: Secondary | ICD-10-CM | POA: Diagnosis not present

## 2022-11-10 DIAGNOSIS — Z955 Presence of coronary angioplasty implant and graft: Secondary | ICD-10-CM | POA: Diagnosis not present

## 2022-11-10 NOTE — Progress Notes (Signed)
Daily Session Note  Patient Details  Name: Arthur Wilson MRN: [id number] Date of Birth: 1945-12-12 Referring Provider:   Flowsheet Row CARDIAC REHAB PHASE II ORIENTATION from 10/18/2022 in [facility] CARDIAC REHABILITATION  Referring Provider Dr. Jomarie Longs       Encounter Date: 11/10/2022  Check In:  Session Check In - 11/10/22 1420       Check-In   Supervising physician immediately available to respond to emergencies See telemetry face sheet for immediately available ER MD    Location [facility]    Staff Present Rodena Medin, RN, Pleas Koch, RN, BSN;Heather Fredric Mare, BS, Exercise Physiologist;Phyllis Billingsley, RN    Virtual Visit No    Medication changes reported     No    Fall or balance concerns reported    Yes    Comments History of falls; unsteady gait    Warm-up and Cool-down Performed on first and last piece of equipment    Resistance Training Performed Yes    VAD Patient? No    PAD/SET Patient? No      Pain Assessment   Currently in Pain? No/denies    Multiple Pain Sites No             Capillary Blood Glucose: No results found for this or any previous visit (from the past 24 hour(s)).    Social History   Tobacco Use  Smoking Status Never  Smokeless Tobacco Never    Goals Met:  Independence with exercise equipment Exercise tolerated well No report of concerns or symptoms today Strength training completed today  Goals Unmet:  Not Applicable  Comments: Pt able to follow exercise prescription today without complaint.  Will continue to monitor for progression.    Dr. Dina Rich is Medical Director for [facility] Cardiac Rehab

## 2022-11-13 ENCOUNTER — Encounter (HOSPITAL_COMMUNITY)
Admission: RE | Admit: 2022-11-13 | Discharge: 2022-11-13 | Disposition: A | Discharge status: Home / Self Care | Payer: Medicare Other | Source: Ambulatory Visit | Attending: Cardiology | Admitting: Cardiology

## 2022-11-13 ENCOUNTER — Encounter (HOSPITAL_COMMUNITY): Payer: Medicare Other

## 2022-11-13 DIAGNOSIS — Z955 Presence of coronary angioplasty implant and graft: Secondary | ICD-10-CM | POA: Diagnosis not present

## 2022-11-13 DIAGNOSIS — I214 Non-ST elevation (NSTEMI) myocardial infarction: Secondary | ICD-10-CM | POA: Diagnosis not present

## 2022-11-13 NOTE — Progress Notes (Signed)
Daily Session Note  Patient Details  Name: Arthur Wilson MRN: [id number] Date of Birth: 04-26-1945 Referring Provider:   Flowsheet Row CARDIAC REHAB PHASE II ORIENTATION from 10/18/2022 in [facility] CARDIAC REHABILITATION  Referring Provider Dr. Jomarie Longs       Encounter Date: 11/13/2022  Check In:  Session Check In - 11/13/22 1420       Check-In   Supervising physician immediately available to respond to emergencies See telemetry face sheet for immediately available MD    Physician(s) Dr. Wyline Mood    Location [facility]    Staff Present Rodena Medin, RN, Pleas Koch, RN, BSN;Nikia Levels, RN;Jessica Hawkins, MA, RCEP, CCRP, CCET    Virtual Visit No    Medication changes reported     No    Fall or balance concerns reported    Yes    Comments History of falls; unsteady gait    Tobacco Cessation No Change    Warm-up and Cool-down Performed on first and last piece of equipment    Resistance Training Performed Yes    VAD Patient? No    PAD/SET Patient? No      Pain Assessment   Currently in Pain? No/denies    Multiple Pain Sites No             Capillary Blood Glucose: No results found for this or any previous visit (from the past 24 hour(s)).    Social History   Tobacco Use  Smoking Status Never  Smokeless Tobacco Never    Goals Met:  Proper associated with RPD/PD & O2 Sat Independence with exercise equipment Exercise tolerated well No report of concerns or symptoms today Strength training completed today  Goals Unmet:  Not Applicable  Comments: Pt able to follow exercise prescription today without complaint.  Will continue to monitor for progression.    Dr. Dina Rich is Medical Director for [facility] Cardiac Rehab

## 2022-11-14 DIAGNOSIS — I700 Atherosclerosis of aorta: Secondary | ICD-10-CM | POA: Diagnosis not present

## 2022-11-14 DIAGNOSIS — I4821 Permanent atrial fibrillation: Secondary | ICD-10-CM | POA: Diagnosis not present

## 2022-11-14 DIAGNOSIS — Z79899 Other long term (current) drug therapy: Secondary | ICD-10-CM | POA: Diagnosis not present

## 2022-11-14 DIAGNOSIS — Z125 Encounter for screening for malignant neoplasm of prostate: Secondary | ICD-10-CM | POA: Diagnosis not present

## 2022-11-14 DIAGNOSIS — I2510 Atherosclerotic heart disease of native coronary artery without angina pectoris: Secondary | ICD-10-CM | POA: Diagnosis not present

## 2022-11-14 DIAGNOSIS — I10 Essential (primary) hypertension: Secondary | ICD-10-CM | POA: Diagnosis not present

## 2022-11-14 DIAGNOSIS — E1129 Type 2 diabetes mellitus with other diabetic kidney complication: Secondary | ICD-10-CM | POA: Diagnosis not present

## 2022-11-14 DIAGNOSIS — F419 Anxiety disorder, unspecified: Secondary | ICD-10-CM | POA: Diagnosis not present

## 2022-11-15 ENCOUNTER — Encounter (HOSPITAL_COMMUNITY)
Admission: RE | Admit: 2022-11-15 | Discharge: 2022-11-15 | Disposition: A | Discharge status: Home / Self Care | Payer: Medicare Other | Source: Ambulatory Visit | Attending: Cardiology | Admitting: Cardiology

## 2022-11-15 ENCOUNTER — Encounter (HOSPITAL_COMMUNITY): Payer: Medicare Other

## 2022-11-15 DIAGNOSIS — I214 Non-ST elevation (NSTEMI) myocardial infarction: Secondary | ICD-10-CM

## 2022-11-15 DIAGNOSIS — Z955 Presence of coronary angioplasty implant and graft: Secondary | ICD-10-CM

## 2022-11-15 NOTE — Progress Notes (Signed)
Daily Session Note  Patient Details  Name: Arthur Wilson MRN: [id number] Date of Birth: [birth year] Referring Provider:   Flowsheet Row CARDIAC REHAB PHASE II ORIENTATION from 10/18/2022 in [facility] CARDIAC REHABILITATION  Referring Provider Dr. Jomarie Longs       Encounter Date: 11/15/2022  Check In:  Session Check In - 11/15/22 1400       Check-In   Supervising physician immediately available to respond to emergencies See telemetry face sheet for immediately available ER MD    Location [facility]    Staff Present Rodena Medin, RN, Pleas Koch, RN, BSN;Heather Fredric Mare, BS, Exercise Physiologist    Virtual Visit No    Medication changes reported     No    Fall or balance concerns reported    Yes    Comments History of falls; unsteady gait    Tobacco Cessation Use Decreased    Warm-up and Cool-down Performed on first and last piece of equipment    Resistance Training Performed Yes    VAD Patient? No    PAD/SET Patient? No      Pain Assessment   Currently in Pain? No/denies    Multiple Pain Sites No             Capillary Blood Glucose: No results found for this or any previous visit (from the past 24 hour(s)).    Social History   Tobacco Use  Smoking Status Never  Smokeless Tobacco Never    Goals Met:  Independence with exercise equipment Exercise tolerated well No report of concerns or symptoms today Strength training completed today  Goals Unmet:  Not Applicable  Comments: Pt able to follow exercise prescription today without complaint.  Will continue to monitor for progression.    Dr. Dina Rich is Medical Director for [facility] Cardiac Rehab

## 2022-11-17 ENCOUNTER — Encounter (HOSPITAL_COMMUNITY): Payer: Medicare Other

## 2022-11-17 ENCOUNTER — Encounter (HOSPITAL_COMMUNITY)
Admission: RE | Admit: 2022-11-17 | Discharge: 2022-11-17 | Disposition: A | Discharge status: Home / Self Care | Payer: Medicare Other | Source: Ambulatory Visit | Attending: Cardiology | Admitting: Cardiology

## 2022-11-17 DIAGNOSIS — I214 Non-ST elevation (NSTEMI) myocardial infarction: Secondary | ICD-10-CM | POA: Diagnosis not present

## 2022-11-17 DIAGNOSIS — Z955 Presence of coronary angioplasty implant and graft: Secondary | ICD-10-CM

## 2022-11-17 NOTE — Progress Notes (Signed)
Daily Session Note  Patient Details  Name: NAASIR NEWCOMER MRN: [id number] Date of Birth: 01/05/1946 Referring Provider:   Flowsheet Row CARDIAC REHAB PHASE II ORIENTATION from 10/18/2022 in [facility] CARDIAC REHABILITATION  Referring Provider Dr. Jomarie Longs       Encounter Date: 11/17/2022  Check In:  Session Check In - 11/17/22 1415       Check-In   Supervising physician immediately available to respond to emergencies See telemetry face sheet for immediately available ER MD    Location [facility]    Staff Present Ross Ludwig, BS, Exercise Physiologist;Maleak Brazzel Laural Benes, RN, BSN;Phyllis Billingsley, RN    Virtual Visit No    Medication changes reported     No    Fall or balance concerns reported    Yes    Comments History of falls; unsteady gait    Warm-up and Cool-down Performed on first and last piece of equipment    Resistance Training Performed Yes    VAD Patient? No    PAD/SET Patient? No      Pain Assessment   Currently in Pain? No/denies    Multiple Pain Sites No             Capillary Blood Glucose: No results found for this or any previous visit (from the past 24 hour(s)).    Social History   Tobacco Use  Smoking Status Never  Smokeless Tobacco Never    Goals Met:  Independence with exercise equipment Exercise tolerated well No report of concerns or symptoms today Strength training completed today  Goals Unmet:  Not Applicable  Comments: Pt able to follow exercise prescription today without complaint.  Will continue to monitor for progression.    Dr. Dina Rich is Medical Director for [facility] Cardiac Rehab

## 2022-11-20 ENCOUNTER — Encounter (HOSPITAL_COMMUNITY)
Admission: RE | Admit: 2022-11-20 | Discharge: 2022-11-20 | Disposition: A | Discharge status: Home / Self Care | Payer: Medicare Other | Source: Ambulatory Visit | Attending: Cardiology | Admitting: Cardiology

## 2022-11-20 ENCOUNTER — Encounter (HOSPITAL_COMMUNITY): Payer: Medicare Other

## 2022-11-20 DIAGNOSIS — I214 Non-ST elevation (NSTEMI) myocardial infarction: Secondary | ICD-10-CM | POA: Diagnosis not present

## 2022-11-20 DIAGNOSIS — Z955 Presence of coronary angioplasty implant and graft: Secondary | ICD-10-CM | POA: Diagnosis not present

## 2022-11-20 NOTE — Progress Notes (Signed)
Daily Session Note  Patient Details  Name: Arthur Wilson MRN: [id number] Date of Birth: January 16, 1946 Referring Provider:   Flowsheet Row CARDIAC REHAB PHASE II ORIENTATION from 10/18/2022 in [facility] CARDIAC REHABILITATION  Referring Provider Dr. Jomarie Longs       Encounter Date: 11/20/2022  Check In:  Session Check In - 11/20/22 1430       Check-In   Supervising physician immediately available to respond to emergencies See telemetry face sheet for immediately available MD    Location [facility]    Staff Present Ross Ludwig, BS, Exercise Physiologist;Jessica Forsgate, MA, RCEP, CCRP, Harolyn Rutherford, RN, BSN    Virtual Visit No    Medication changes reported     No    Fall or balance concerns reported    Yes    Comments History of falls; unsteady gait    Tobacco Cessation No Change    Warm-up and Cool-down Performed on first and last piece of equipment    Resistance Training Performed Yes    VAD Patient? No      Pain Assessment   Currently in Pain? No/denies             Capillary Blood Glucose: No results found for this or any previous visit (from the past 24 hour(s)).    Social History   Tobacco Use  Smoking Status Never  Smokeless Tobacco Never    Goals Met:  Independence with exercise equipment Exercise tolerated well No report of concerns or symptoms today Strength training completed today  Goals Unmet:  Not Applicable  Comments: Pt able to follow exercise prescription today without complaint.  Will continue to monitor for progression.    Dr. Dina Rich is Medical Director for [facility] Cardiac Rehab

## 2022-11-21 DIAGNOSIS — E1122 Type 2 diabetes mellitus with diabetic chronic kidney disease: Secondary | ICD-10-CM | POA: Diagnosis not present

## 2022-11-21 DIAGNOSIS — I700 Atherosclerosis of aorta: Secondary | ICD-10-CM | POA: Diagnosis not present

## 2022-11-21 DIAGNOSIS — I2510 Atherosclerotic heart disease of native coronary artery without angina pectoris: Secondary | ICD-10-CM | POA: Diagnosis not present

## 2022-11-21 DIAGNOSIS — R809 Proteinuria, unspecified: Secondary | ICD-10-CM | POA: Diagnosis not present

## 2022-11-21 DIAGNOSIS — I480 Paroxysmal atrial fibrillation: Secondary | ICD-10-CM | POA: Diagnosis not present

## 2022-11-21 DIAGNOSIS — I10 Essential (primary) hypertension: Secondary | ICD-10-CM | POA: Diagnosis not present

## 2022-11-21 DIAGNOSIS — Z0001 Encounter for general adult medical examination with abnormal findings: Secondary | ICD-10-CM | POA: Diagnosis not present

## 2022-11-21 DIAGNOSIS — E785 Hyperlipidemia, unspecified: Secondary | ICD-10-CM | POA: Diagnosis not present

## 2022-11-22 ENCOUNTER — Encounter (HOSPITAL_COMMUNITY): Admission: RE | Admit: 2022-11-22 | Payer: Medicare Other | Source: Ambulatory Visit

## 2022-11-22 ENCOUNTER — Encounter (HOSPITAL_COMMUNITY): Payer: Medicare Other

## 2022-11-22 ENCOUNTER — Encounter: Payer: Self-pay | Admitting: Internal Medicine

## 2022-11-22 DIAGNOSIS — Z955 Presence of coronary angioplasty implant and graft: Secondary | ICD-10-CM | POA: Diagnosis not present

## 2022-11-22 DIAGNOSIS — I214 Non-ST elevation (NSTEMI) myocardial infarction: Secondary | ICD-10-CM

## 2022-11-22 NOTE — Progress Notes (Signed)
Daily Session Note  Patient Details  Name: Arthur Wilson MRN: [id number] Date of Birth: [birth year] Referring Provider:   Flowsheet Row CARDIAC REHAB PHASE II ORIENTATION from 10/18/2022 in [facility] CARDIAC REHABILITATION  Referring Provider Dr. Jomarie Longs       Encounter Date: 11/22/2022  Check In:  Session Check In - 11/22/22 1426       Check-In   Supervising physician immediately available to respond to emergencies See telemetry face sheet for immediately available MD    Location [facility]    Staff Present Ross Ludwig, BS, Exercise Physiologist;Debra Laural Benes, RN, Pleas Koch, RN, BSN    Virtual Visit No    Medication changes reported     No    Fall or balance concerns reported    Yes    Comments History of falls; unsteady gait    Tobacco Cessation No Change    Warm-up and Cool-down Performed on first and last piece of equipment    Resistance Training Performed Yes    VAD Patient? No    PAD/SET Patient? No      Pain Assessment   Currently in Pain? No/denies    Multiple Pain Sites No             Capillary Blood Glucose: No results found for this or any previous visit (from the past 24 hour(s)).    Social History   Tobacco Use  Smoking Status Never  Smokeless Tobacco Never    Goals Met:  Independence with exercise equipment Exercise tolerated well No report of concerns or symptoms today Strength training completed today  Goals Unmet:  Not Applicable  Comments: Pt able to follow exercise prescription today without complaint.  Will continue to monitor for progression.    Dr. Dina Rich is Medical Director for [facility] Cardiac Rehab

## 2022-11-24 ENCOUNTER — Encounter (HOSPITAL_COMMUNITY): Payer: Medicare Other

## 2022-11-24 ENCOUNTER — Encounter (HOSPITAL_COMMUNITY)
Admission: RE | Admit: 2022-11-24 | Discharge: 2022-11-24 | Disposition: A | Discharge status: Home / Self Care | Payer: Medicare Other | Source: Ambulatory Visit | Attending: Cardiology | Admitting: Cardiology

## 2022-11-24 DIAGNOSIS — Z955 Presence of coronary angioplasty implant and graft: Secondary | ICD-10-CM | POA: Diagnosis not present

## 2022-11-24 DIAGNOSIS — I214 Non-ST elevation (NSTEMI) myocardial infarction: Secondary | ICD-10-CM

## 2022-11-24 NOTE — Progress Notes (Signed)
Daily Session Note  Patient Details  Name: Arthur Wilson MRN: [id number] Date of Birth: February 23, 1946 Referring Provider:   Flowsheet Row CARDIAC REHAB PHASE II ORIENTATION from 10/18/2022 in [facility] CARDIAC REHABILITATION  Referring Provider Dr. Jomarie Longs       Encounter Date: 11/24/2022  Check In:  Session Check In - 11/24/22 1430       Check-In   Supervising physician immediately available to respond to emergencies See telemetry face sheet for immediately available MD    Location [facility]-Cardiac [facility] Pul[facility] Rehab    Staff Present Ross Ludwig, BS, Exercise Physiologist;Daphyne Daphine Deutscher, RN, BSN    Staff Present Kary Kos, MS, Exercise Physiologist    Medication changes reported     No    Fall or balance concerns reported    Yes    Comments History of falls; unsteady gait    Tobacco Cessation No Change    Warm-up and Cool-down Performed on first and last piece of equipment    Resistance Training Performed Yes    VAD Patient? No    PAD/SET Patient? No      Pain Assessment   Currently in Pain? No/denies    Multiple Pain Sites No             Capillary Blood Glucose: No results found for this or any previous visit (from the past 24 hour(s)).    Social History   Tobacco Use  Smoking Status Never  Smokeless Tobacco Never    Goals Met:  Independence with exercise equipment Exercise tolerated well No report of concerns or symptoms today Strength training completed today  Goals Unmet:  Not Applicable  Comments: Pt able to follow exercise prescription today without complaint.  Will continue to monitor for progression.    Dr. Dina Rich is Medical Director for [facility] Cardiac Rehab

## 2022-11-26 DIAGNOSIS — E119 Type 2 diabetes mellitus without complications: Secondary | ICD-10-CM | POA: Diagnosis not present

## 2022-11-27 ENCOUNTER — Encounter (HOSPITAL_COMMUNITY): Payer: Medicare Other

## 2022-11-29 ENCOUNTER — Encounter (HOSPITAL_COMMUNITY)
Admission: RE | Admit: 2022-11-29 | Discharge: 2022-11-29 | Disposition: A | Discharge status: Home / Self Care | Payer: Medicare Other | Source: Ambulatory Visit | Attending: Cardiology | Admitting: Cardiology

## 2022-11-29 ENCOUNTER — Encounter (HOSPITAL_COMMUNITY): Payer: Medicare Other

## 2022-11-29 DIAGNOSIS — I214 Non-ST elevation (NSTEMI) myocardial infarction: Secondary | ICD-10-CM | POA: Diagnosis not present

## 2022-11-29 DIAGNOSIS — Z955 Presence of coronary angioplasty implant and graft: Secondary | ICD-10-CM | POA: Diagnosis not present

## 2022-11-29 NOTE — Progress Notes (Signed)
Daily Session Note  Patient Details  Name: Arthur Wilson MRN: [id number] Date of Birth: 05/24/1945 Referring Provider:   Flowsheet Row CARDIAC REHAB PHASE II ORIENTATION from 10/18/2022 in [facility] CARDIAC REHABILITATION  Referring Provider Dr. Jomarie Longs       Encounter Date: 11/29/2022  Check In:  Session Check In - 11/29/22 1400       Check-In   Supervising physician immediately available to respond to emergencies [facility] MD immediately available    Physician(s) Dr. Wyline Mood    Location [facility]    Staff Present Ross Ludwig, BS, Exercise Physiologist;Nirvi Boehler BSN, RN;Jessica [place]fork, MA, RCEP, CCRP, CCET    Virtual Visit No    Medication changes reported     No    Fall or balance concerns reported    Yes    Comments History of falls; unsteady gait    Tobacco Cessation No Change    Warm-up and Cool-down Performed on first and last piece of equipment    Resistance Training Performed Yes    VAD Patient? No    PAD/SET Patient? No      Pain Assessment   Currently in Pain? No/denies    Multiple Pain Sites No             Capillary Blood Glucose: No results found for this or any previous visit (from the past 24 hour(s)).    Social History   Tobacco Use  Smoking Status Never  Smokeless Tobacco Never    Goals Met:  Independence with exercise equipment Exercise tolerated well No report of concerns or symptoms today Strength training completed today  Goals Unmet:  Not Applicable  Comments: [name] able to follow exercise prescription today without complaint.  Will continue to monitor for progression.    Dr. Dina Rich is Medical Director for [facility] Cardiac Rehab

## 2022-12-01 ENCOUNTER — Encounter (HOSPITAL_COMMUNITY): Payer: Medicare Other

## 2022-12-01 ENCOUNTER — Encounter (HOSPITAL_COMMUNITY)
Admission: RE | Admit: 2022-12-01 | Discharge: 2022-12-01 | Disposition: A | Discharge status: Home / Self Care | Payer: Medicare Other | Source: Ambulatory Visit | Attending: Cardiology

## 2022-12-01 DIAGNOSIS — I214 Non-ST elevation (NSTEMI) myocardial infarction: Secondary | ICD-10-CM

## 2022-12-01 DIAGNOSIS — Z955 Presence of coronary angioplasty implant and graft: Secondary | ICD-10-CM | POA: Diagnosis not present

## 2022-12-01 NOTE — Progress Notes (Signed)
Daily Session Note  Patient Details  Name: Arthur Wilson MRN: [id number] Date of Birth: 1945/11/16 Referring Provider:   Flowsheet Row CARDIAC REHAB PHASE II ORIENTATION from 10/18/2022 in [facility] CARDIAC REHABILITATION  Referring Provider Dr. Jomarie Longs       Encounter Date: 12/01/2022  Check In:  Session Check In - 12/01/22 1430       Check-In   Supervising physician immediately available to respond to emergencies [facility] MD immediately available    Physician(s) Dr. Wyline Mood    Location [facility]    Staff Present Erskine Speed, RN;Debra Laural Benes, RN, Pleas Koch, RN, BSN    Virtual Visit No    Medication changes reported     No    Fall or balance concerns reported    Yes    Comments History of falls; unsteady gait    Tobacco Cessation No Change    Warm-up and Cool-down Performed on first and last piece of equipment    Resistance Training Performed Yes    VAD Patient? No    PAD/SET Patient? No      Pain Assessment   Currently in Pain? No/denies    Multiple Pain Sites No             Capillary Blood Glucose: No results found for this or any previous visit (from the past 24 hour(s)).    Social History   Tobacco Use  Smoking Status Never  Smokeless Tobacco Never    Goals Met:  Independence with exercise equipment Exercise tolerated well No report of concerns or symptoms today  Goals Unmet:  Not Applicable  Comments: Pt able to follow exercise prescription today without complaint.  Will continue to monitor for progression.    Dr. Dina Rich is Medical Director for [facility] Cardiac Rehab

## 2022-12-04 ENCOUNTER — Encounter (HOSPITAL_COMMUNITY): Payer: Medicare Other

## 2022-12-04 ENCOUNTER — Encounter (HOSPITAL_COMMUNITY)
Admission: RE | Admit: 2022-12-04 | Discharge: 2022-12-04 | Disposition: A | Discharge status: Home / Self Care | Payer: Medicare Other | Source: Ambulatory Visit | Attending: Cardiology | Admitting: Cardiology

## 2022-12-04 DIAGNOSIS — Z955 Presence of coronary angioplasty implant and graft: Secondary | ICD-10-CM

## 2022-12-04 DIAGNOSIS — I214 Non-ST elevation (NSTEMI) myocardial infarction: Secondary | ICD-10-CM | POA: Diagnosis not present

## 2022-12-04 NOTE — Progress Notes (Signed)
Daily Session Note  Patient Details  Name: Arthur Wilson MRN: [id number] Date of Birth: 09/18/1945 Referring Provider:   Flowsheet Row CARDIAC REHAB PHASE II ORIENTATION from 10/18/2022 in [facility] CARDIAC REHABILITATION  Referring Provider Dr. Jomarie Longs       Encounter Date: 12/04/2022  Check In:  Session Check In - 12/04/22 1442       Check-In   Supervising physician immediately available to respond to emergencies See telemetry face sheet for immediately available MD    Location [facility]    Staff Present Ross Ludwig, BS, Exercise Physiologist;Kaitlinn Iversen Juanetta Gosling, MA, RCEP, CCRP, CCET;Phyllis Billingsley, RN    Virtual Visit No    Medication changes reported     No    Fall or balance concerns reported    No    Warm-up and Cool-down Performed on first and last piece of equipment    Resistance Training Performed Yes    VAD Patient? No    PAD/SET Patient? No      Pain Assessment   Currently in Pain? No/denies             Capillary Blood Glucose: No results found for this or any previous visit (from the past 24 hour(s)).    Social History   Tobacco Use  Smoking Status Never  Smokeless Tobacco Never    Goals Met:  Independence with exercise equipment Exercise tolerated well No report of concerns or symptoms today Strength training completed today  Goals Unmet:  Not Applicable  Comments: Pt able to follow exercise prescription today without complaint.  Will continue to monitor for progression.    Dr. Dina Rich is Medical Director for [facility] Cardiac Rehab

## 2022-12-06 ENCOUNTER — Encounter (HOSPITAL_COMMUNITY): Payer: Self-pay | Admitting: *Deleted

## 2022-12-06 ENCOUNTER — Encounter (HOSPITAL_COMMUNITY): Payer: Medicare Other

## 2022-12-06 ENCOUNTER — Encounter (HOSPITAL_COMMUNITY)
Admission: RE | Admit: 2022-12-06 | Discharge: 2022-12-06 | Disposition: A | Discharge status: Home / Self Care | Payer: Medicare Other | Source: Ambulatory Visit | Attending: Cardiology | Admitting: Cardiology

## 2022-12-06 DIAGNOSIS — I214 Non-ST elevation (NSTEMI) myocardial infarction: Secondary | ICD-10-CM

## 2022-12-06 DIAGNOSIS — Z955 Presence of coronary angioplasty implant and graft: Secondary | ICD-10-CM

## 2022-12-06 NOTE — Progress Notes (Signed)
Cardiac Individual Treatment Plan  Patient Details  Name: Arthur Wilson MRN: [id number] Date of Birth: November 03, 1945 Referring Provider:   Flowsheet Row CARDIAC REHAB PHASE II ORIENTATION from 10/18/2022 in [facility] CARDIAC REHABILITATION  Referring Provider Dr. Jomarie Longs       Initial Encounter Date:  Flowsheet Row CARDIAC REHAB PHASE II ORIENTATION from 10/18/2022 in [place] CARDIAC REHABILITATION  Date 10/18/22       Visit Diagnosis: NSTEMI (non-ST elevated myocardial infarction) [facility])  Status post coronary artery stent placement  Patient's Home Medications on Admission:  Current Outpatient Medications:    acetaminophen (TYLENOL) 500 MG tablet, Take 1,000 mg by mouth every 6 (six) hours as needed for moderate pain., Disp: , Rfl:    ALPRAZolam (XANAX) 0.5 MG tablet, Take 0.5 tablets by mouth at bedtime as needed for anxiety or sleep., Disp: , Rfl:    atorvastatin (LIPITOR) 40 MG tablet, Take 1 tablet (40 mg total) by mouth daily., Disp: 30 tablet, Rfl: 0   Continuous Blood Gluc Sensor (DEXCOM G7 SENSOR) MISC, for 30 Days, Disp: , Rfl:    ELIQUIS 5 MG TABS tablet, Take 5 mg by mouth 2 (two) times daily., Disp: , Rfl:    empagliflozin (JARDIANCE) 10 MG TABS tablet, Take 1 tablet (10 mg total) by mouth daily. (Patient taking differently: Take 5 mg by mouth daily.), Disp: 30 tablet, Rfl: 0   fluticasone (FLONASE) 50 MCG/ACT nasal spray, Place 1 spray into both nostrils daily as needed for allergies., Disp: , Rfl:    furosemide (LASIX) 40 MG tablet, 1 tablet Oral once a week for 30 days, Disp: , Rfl:    HYDROcodone-acetaminophen (NORCO) 7.5-325 MG tablet, Take 0.5-1 tablets by mouth 3 (three) times daily as needed for moderate pain., Disp: , Rfl:    HYDROcodone-acetaminophen (NORCO/VICODIN) 5-325 MG tablet, Take 1 tablet by mouth every 6 (six) hours as needed for moderate pain., Disp: 20 tablet, Rfl: 0   metFORMIN (GLUCOPHAGE) 500 MG tablet, Take 500 mg by mouth at bedtime. (Patient  not taking: Reported on 10/11/2022), Disp: , Rfl:    pantoprazole (PROTONIX) 40 MG tablet, Take 1 tablet (40 mg total) by mouth daily at 12 noon., Disp: 30 tablet, Rfl: 0  Past Medical History: Past Medical History:  Diagnosis Date   Atrial fibrillation (HCC)    BPH (benign prostatic hyperplasia)    Cervical spondylosis without myelopathy    Coronary artery disease    a. s/p NSTEMI in 03/2022 with DESx2 to RCA and staged orbital atherectomy/DES placement to proximal LAD   Diabetes mellitus without complication (HCC)    Essential hypertension    History of kidney stones    showed on CT scan   Impaired glucose tolerance    Inguinal hernia bilateral, non-recurrent    Myocardial infarction (HCC) 03/30/2022   OSA on CPAP    Testicular hypofunction     Tobacco Use: Social History   Tobacco Use  Smoking Status Never  Smokeless Tobacco Never    Labs: Review Flowsheet       Latest Ref Rng & Units 03/29/2022 03/30/2022 08/10/2022  Labs for ITP Cardiac and Pulmonary Rehab  Hemoglobin A1c 4.8 - 5.6 % 7.4  - 6.0   PH, Arterial 7.35 - 7.45 - 7.459  7.501  -  PCO2 arterial 32 - 48 mmHg - 42.9  38.9  -  Bicarbonate 20.0 - 28.0 mmol/L - 31.1  30.4  30.4  -  TCO2 22 - 32 mmol/L - 32  32  32  -  O2 Saturation % - 54  60  90  -    Details       Multiple values from one day are sorted in reverse-chronological order         Capillary Blood Glucose: Lab Results  Component Value Date   GLUCAP 120 (H) 08/17/2022   GLUCAP 120 (H) 08/17/2022   GLUCAP 101 (H) 08/17/2022   GLUCAP 132 (H) 08/10/2022   GLUCAP 118 (H) 04/04/2022     Exercise Target Goals: Exercise Program Goal: Individual exercise prescription set using results from initial 6 min walk test and THRR while considering  patient's activity barriers and safety.   Exercise Prescription Goal: Starting with aerobic activity 30 plus minutes a day, 3 days per week for initial exercise prescription. Provide home exercise  prescription and guidelines that participant acknowledges understanding prior to discharge.  Activity Barriers & Risk Stratification:  Activity Barriers & Cardiac Risk Stratification - 10/17/22 0945       Activity Barriers & Cardiac Risk Stratification   Activity Barriers Deconditioning;Muscular Weakness;Other (comment);Joint Problems;History of Falls;Incisional Pain;Back Problems    Comments two hernia repair surgeries in May 2024 still weak from being out for , knee pain from fall last year not sure whats wrong, calf on left leg cramping/painful, occassional low back pain, spndylosis    Cardiac Risk Stratification Moderate             6 Minute Walk:  6 Minute Walk     Row Name 10/18/22 1552         6 Minute Walk   Phase Initial     Distance 800 feet     Walk Time 6 minutes     # of Rest Breaks 0     MPH 1.51     METS 1.59     RPE 12     VO2 Peak 5.59     Symptoms No     Resting HR 80 bpm     Resting BP 136/68     Resting Oxygen Saturation  94 %     Exercise Oxygen Saturation  during 6 min walk 94 %     Max Ex. HR 93 bpm     Max Ex. BP 130/68     2 Minute Post BP 132/68              Oxygen Initial Assessment:   Oxygen Re-Evaluation:   Oxygen Discharge (Final Oxygen Re-Evaluation):   Initial Exercise Prescription:  Initial Exercise Prescription - 10/18/22 1500       Date of Initial Exercise RX and Referring Provider   Date 10/18/22    Referring Provider Dr. Jomarie Longs      Oxygen   Maintain Oxygen Saturation 88% or higher      Treadmill   MPH 1.5    Grade 0    Minutes 15      NuStep   Level 1    SPM 80    Minutes 15      Prescription Details   Frequency (times per week) 3    Duration Progress to 30 minutes of continuous aerobic without signs/symptoms of physical distress      Intensity   THRR 40-80% of Max Heartrate 106-133    Ratings of Perceived Exertion 11-13    Perceived Dyspnea 0-4      Resistance Training   Training  Prescription Yes    Weight 3    Reps 10-15  Perform Capillary Blood Glucose checks as needed.  Exercise Prescription Changes:   Exercise Prescription Changes     Row Name 10/23/22 1500 11/08/22 1500 12/04/22 1500         Response to Exercise   Blood Pressure (Admit) 122/60 116/60 108/64     Blood Pressure (Exercise) 128/58 -- --     Blood Pressure (Exit) 120/60 120/60 96/60     Heart Rate (Admit) 69 bpm 56 bpm 63 bpm     Heart Rate (Exercise) 94 bpm 80 bpm 87 bpm     Heart Rate (Exit) 74 bpm 68 bpm 72 bpm     Rating of Perceived Exertion (Exercise) 12 12 13      Duration Continue with 30 min of aerobic exercise without signs/symptoms of physical distress. Continue with 30 min of aerobic exercise without signs/symptoms of physical distress. Continue with 30 min of aerobic exercise without signs/symptoms of physical distress.     Intensity THRR unchanged THRR unchanged THRR unchanged       Progression   Progression Continue to progress workloads to maintain intensity without signs/symptoms of physical distress. Continue to progress workloads to maintain intensity without signs/symptoms of physical distress. Continue to progress workloads to maintain intensity without signs/symptoms of physical distress.       Resistance Training   Training Prescription Yes Yes Yes     Weight 3 3 5      Reps 10-15 10-15 10-15       Treadmill   MPH 1.5 1.8 2     Grade 0 0 0     Minutes 15 15 15      METs 2.15 2.38 2.53       NuStep   Level 2 4 5      SPM 79 90 90     Minutes 15 15 15      METs 1.8 2.2 2.3       Oxygen   Maintain Oxygen Saturation 88% or higher 88% or higher 88% or higher              Exercise Comments:   Exercise Goals and Review:   Exercise Goals     Row Name 10/18/22 1556             Exercise Goals   Increase Physical Activity Yes       Intervention Provide advice, education, support and counseling about physical activity/exercise  needs.;Develop an individualized exercise prescription for aerobic and resistive training based on initial evaluation findings, risk stratification, comorbidities and participant's personal goals.       Expected Outcomes Short Term: Attend rehab on a regular basis to increase amount of physical activity.;Long Term: Add in home exercise to make exercise part of routine and to increase amount of physical activity.;Long Term: Exercising regularly at least 3-5 days a week.       Increase Strength and Stamina Yes       Intervention Provide advice, education, support and counseling about physical activity/exercise needs.;Develop an individualized exercise prescription for aerobic and resistive training based on initial evaluation findings, risk stratification, comorbidities and participant's personal goals.       Expected Outcomes Short Term: Increase workloads from initial exercise prescription for resistance, speed, and METs.;Short Term: Perform resistance training exercises routinely during rehab and add in resistance training at home;Long Term: Improve cardiorespiratory fitness, muscular endurance and strength as measured by increased METs and functional capacity ( )       Able to understand and use rate of perceived exertion (  RPE) scale Yes       Intervention Provide education and explanation on how to use RPE scale       Expected Outcomes Short Term: Able to use RPE daily in rehab to express subjective intensity level;Long Term:  Able to use RPE to guide intensity level when exercising independently       Knowledge and understanding of Target Heart Rate Range (THRR) Yes       Intervention Provide education and explanation of THRR including how the numbers were predicted and where they are located for reference       Expected Outcomes Short Term: Able to state/look up THRR;Long Term: Able to use THRR to govern intensity when exercising independently;Short Term: Able to use daily as guideline for  intensity in rehab       Able to check pulse independently Yes       Intervention Provide education and demonstration on how to check pulse in carotid and radial arteries.;Review the importance of being able to check your own pulse for safety during independent exercise       Expected Outcomes Short Term: Able to explain why pulse checking is important during independent exercise;Long Term: Able to check pulse independently and accurately       Understanding of Exercise Prescription Yes       Intervention Provide education, explanation, and written materials on patient's individual exercise prescription       Expected Outcomes Short Term: Able to explain program exercise prescription;Long Term: Able to explain home exercise prescription to exercise independently                Exercise Goals Re-Evaluation :  Exercise Goals Re-Evaluation     Row Name 10/24/22 0842 11/09/22 0951 12/04/22 1436         Exercise Goal Re-Evaluation   Exercise Goals Review -- Increase Physical Activity;Understanding of Exercise Prescription;Increase Strength and Stamina Increase Physical Activity;Increase Strength and Stamina;Understanding of Exercise Prescription     Comments Pt has just started rehab, he is tolerating exercise well and is increase his SPM each class Pt has increased his workload on the NuStep to level 4 and is exercising at an RPE of 12. Will continue to montior and progress as able. Evette Doffing is doing great in rehab. He has increased his nustep level to 5 and is doing good. He said that he has noticed his strength increasing in his legs since when he started the program. He is able to get out and do more since his first day in the program     Expected Outcomes -- Short term: Increase workload on treadmill in the next two weeks   long term: continue to attend cardiac rehab sessions Short term: Increase workload on treadmill   long term: continue to attend cardiac rehab sessions                Discharge Exercise Prescription (Final Exercise Prescription Changes):  Exercise Prescription Changes - 12/04/22 1500       Response to Exercise   Blood Pressure (Admit) 108/64    Blood Pressure (Exit) 96/60    Heart Rate (Admit) 63 bpm    Heart Rate (Exercise) 87 bpm    Heart Rate (Exit) 72 bpm    Rating of Perceived Exertion (Exercise) 13    Duration Continue with 30 min of aerobic exercise without signs/symptoms of physical distress.    Intensity THRR unchanged      Progression   Progression Continue to progress  workloads to maintain intensity without signs/symptoms of physical distress.      Resistance Training   Training Prescription Yes    Weight 5    Reps 10-15      Treadmill   MPH 2    Grade 0    Minutes 15    METs 2.53      NuStep   Level 5    SPM 90    Minutes 15    METs 2.3      Oxygen   Maintain Oxygen Saturation 88% or higher             Nutrition:  Target Goals: Understanding of nutrition guidelines, daily intake of sodium 1500mg , cholesterol 200mg , calories 30% from fat and 7% or less from saturated fats, daily to have 5 or more servings of fruits and vegetables.  Biometrics:  Pre Biometrics - 10/18/22 1557       Pre Biometrics   Height 5\' 9"  (1.753 m)    Weight 205 lb 0.4 oz (93 kg)    Waist Circumference 42 inches    Hip Circumference 44 inches    Waist to Hip Ratio 0.95 %    BMI (Calculated) 30.26    Triceps Skinfold 15 mm    % Body Fat 29.5 %    Grip Strength 22.3 kg    Flexibility 0 in    Single Leg Stand 0 seconds              Nutrition Therapy Plan and Nutrition Goals:  Nutrition Therapy & Goals - 10/17/22 1009       Intervention Plan   Intervention Prescribe, educate and counsel regarding individualized specific dietary modifications aiming towards targeted core components such as weight, hypertension, lipid management, diabetes, heart failure and other comorbidities.;Nutrition handout(s) given to patient.     Expected Outcomes Short Term Goal: Understand basic principles of dietary content, such as calories, fat, sodium, cholesterol and nutrients.             Nutrition Assessments:  MEDIFICTS Score Key: >=70 Need to make dietary changes  40-70 Heart Healthy Diet <= 40 Therapeutic Level Cholesterol Diet  Flowsheet Row CARDIAC VIRTUAL BASED CARE from 10/17/2022 in [facility] CARDIAC REHABILITATION  Picture Your Plate Total Score on Admission 63      Picture Your Plate Scores: <13 Unhealthy dietary pattern with much room for improvement. 41-50 Dietary pattern unlikely to meet recommendations for good health and room for improvement. 51-60 More healthful dietary pattern, with some room for improvement.  >60 Healthy dietary pattern, although there may be some specific behaviors that could be improved.    Nutrition Goals Re-Evaluation:  Nutrition Goals Re-Evaluation     Row Name 12/04/22 1448             Goals   Nutrition Goal healthy eating       Comment Agamjot said that he has noticed that he has more of an appatie in the past couple of weeks. He eats more chicken with some fish and beef through the week. He loves to eat veggies and will eat them for meals. He eats beans when he eats veggies. He does not really like sweets. He eats some just to get his sugar up and will eat ice cream some days.       Expected Outcome Short term: contiue with healthy eating Long term: continue to watch eating portions and healthy eating  Nutrition Goals Discharge (Final Nutrition Goals Re-Evaluation):  Nutrition Goals Re-Evaluation - 12/04/22 1448       Goals   Nutrition Goal healthy eating    Comment Emonte said that he has noticed that he has more of an appatie in the past couple of weeks. He eats more chicken with some fish and beef through the week. He loves to eat veggies and will eat them for meals. He eats beans when he eats veggies. He does not really like sweets. He  eats some just to get his sugar up and will eat ice cream some days.    Expected Outcome Short term: contiue with healthy eating Long term: continue to watch eating portions and healthy eating             Psychosocial: Target Goals: Acknowledge presence or absence of significant depression and/or stress, maximize coping skills, provide positive support system. Participant is able to verbalize types and ability to use techniques and skills needed for reducing stress and depression.  Initial Review & Psychosocial Screening:  Initial Psych Review & Screening - 10/17/22 1011       Initial Review   Current issues with Current Psychotropic Meds;Current Stress Concerns;Current Sleep Concerns    Source of Stress Concerns Chronic Illness;Unable to participate in former interests or hobbies;Unable to perform yard/household activities;Family    Comments recovering from surgery and feeling weak and unable to go, stressors over kids, wife recently diagnosed with lung cancer, hasn't been able to get out and about as much, uses CPAP at night, sleeps fairly well usually 5-6 hrs      Family Dynamics   Good Support System? Yes   wife, kids     Barriers   Psychosocial barriers to participate in program The patient should benefit from training in stress management and relaxation.;Psychosocial barriers identified (see note)      Screening Interventions   Interventions Encouraged to exercise;To provide support and resources with identified psychosocial needs;Provide feedback about the scores to participant    Expected Outcomes Short Term goal: Utilizing psychosocial counselor, staff and physician to assist with identification of specific Stressors or current issues interfering with healing process. Setting desired goal for each stressor or current issue identified.;Long Term Goal: Stressors or current issues are controlled or eliminated.;Short Term goal: Identification and review with participant of any  Quality of Life or Depression concerns found by scoring the questionnaire.;Long Term goal: The participant improves quality of Life and PHQ9 Scores as seen by post scores and/or verbalization of changes             Quality of Life Scores:  Quality of Life - 10/17/22 1022       Quality of Life   Select Quality of Life      Quality of Life Scores   Health/Function Pre 21.6 %    Socioeconomic Pre 28.13 %    Psych/Spiritual Pre 28.29 %    Family Pre 12 %    GLOBAL Pre 23.06 %            Scores of 19 and below usually indicate a poorer quality of life in these areas.  A difference of  2-3 points is a clinically meaningful difference.  A difference of 2-3 points in the total score of the Quality of Life Index has been associated with significant improvement in overall quality of life, self-image, physical symptoms, and general health in studies assessing change in quality of life.  PHQ-9: Review Flowsheet  10/18/2022  Depression screen PHQ 2/9  Decreased Interest 0  Down, Depressed, Hopeless 0  PHQ - 2 Score 0  Altered sleeping 0  Tired, decreased energy 0  Change in appetite 0  Feeling bad or failure about yourself  0  Trouble concentrating 0  Moving slowly or fidgety/restless 0  Suicidal thoughts 0  PHQ-9 Score 0    Details           Interpretation of Total Score  Total Score Depression Severity:  1-4 = Minimal depression, 5-9 = Mild depression, 10-14 = Moderate depression, 15-19 = Moderately severe depression, 20-27 = Severe depression   Psychosocial Evaluation and Intervention:  Psychosocial Evaluation - 10/17/22 1024       Psychosocial Evaluation & Interventions   Interventions Stress management education;Relaxation education;Encouraged to exercise with the program and follow exercise prescription    Comments Bela is coming into Cardiac Rehab after having a NSTEMI with 3 stents in January.  He was placed on hold for hernia repair surgery in May.   He has been having a hard recovery since surgery and lacking energy and stamina.  He has also noted that his R calf will wake him with pain at night.  His doctor wants him to get moving again.  He wants to work to get back to what he was doing prior to his heart event.  He would like to be able to get out and about to go to store and help around house.  Last week was his first trip to store since surgery, and it fatigued him quickly.  He uses xanan to sleep at night on occassion to calm his mind as he has lots of family stressors between wife and kids.  He states his kids have not bad the best choices and his wife was diagnoisised with lung cancer three years ago and her med was to extend her life for 5 years.  She is doing well, but he worries about her and long term for both of them.  He has no barriers to attending rehab.  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  He is generally a happy and positive man and tries not to dewell on the negative side of things or let things beyond his control bother him too much.    Expected Outcomes Short: Attend rehab to build up stamina Long: Continue to focus on the positive    Continue Psychosocial Services  Follow up required by staff             Psychosocial Re-Evaluation:  Psychosocial Re-Evaluation     Row Name 12/04/22 1439             Psychosocial Re-Evaluation   Comments Ande stated that he has been able to sleep longer and have a better sleep since starting the program and exercising. He stated that he does not have any more stressors in his life.       Expected Outcomes Short: continue to exercise for sleep and happiness. long term: continue to not have stressors in his life       Interventions Stress management education;Relaxation education;Encouraged to attend Cardiac Rehabilitation for the exercise       Continue Psychosocial Services  Follow up required by staff                Psychosocial Discharge (Final  Psychosocial Re-Evaluation):  Psychosocial Re-Evaluation - 12/04/22 1439       Psychosocial Re-Evaluation   Comments Leonette Most  stated that he has been able to sleep longer and have a better sleep since starting the program and exercising. He stated that he does not have any more stressors in his life.    Expected Outcomes Short: continue to exercise for sleep and happiness. long term: continue to not have stressors in his life    Interventions Stress management education;Relaxation education;Encouraged to attend Cardiac Rehabilitation for the exercise    Continue Psychosocial Services  Follow up required by staff             Vocational Rehabilitation: Provide vocational rehab assistance to qualifying candidates.   Vocational Rehab Evaluation & Intervention:  Vocational Rehab - 10/17/22 1002       Initial Vocational Rehab Evaluation & Intervention   Assessment shows need for Vocational Rehabilitation No   already back to work as pharmacist            Education: Education Goals: Education classes will be provided on a weekly basis, covering required topics. Participant will state understanding/return demonstration of topics presented.  Learning Barriers/Preferences:  Learning Barriers/Preferences - 10/17/22 1009       Learning Barriers/Preferences   Learning Barriers Sight   glasses for reading, L eye lens implant   Learning Preferences Skilled Demonstration;Written Material             Education Topics: Hypertension, Hypertension Reduction -Define heart disease and high blood pressure. Discus how high blood pressure affects the body and ways to reduce high blood pressure.   Exercise and Your Heart -Discuss why it is important to exercise, the FITT principles of exercise, normal and abnormal responses to exercise, and how to exercise safely.   Angina -Discuss definition of angina, causes of angina, treatment of angina, and how to decrease risk of having  angina.   Cardiac Medications -Review what the following cardiac medications are used for, how they affect the body, and side effects that may occur when taking the medications.  Medications include Aspirin, Beta blockers, calcium channel blockers, ACE Inhibitors, angiotensin receptor blockers, diuretics, digoxin, and antihyperlipidemics. Flowsheet Row CARDIAC REHAB PHASE II EXERCISE from 11/29/2022 in [place] CARDIAC REHABILITATION  Date 11/22/22  Educator DJ  Instruction Review Code 1- Verbalizes Understanding       Congestive Heart Failure -Discuss the definition of CHF, how to live with CHF, the signs and symptoms of CHF, and how keep track of weight and sodium intake. Flowsheet Row CARDIAC REHAB PHASE II EXERCISE from 11/29/2022 in [place] CARDIAC REHABILITATION  Date 10/25/22  Educator HB  Instruction Review Code 1- Verbalizes Understanding       Heart Disease and Intimacy -Discus the effect sexual activity has on the heart, how changes occur during intimacy as we age, and safety during sexual activity. Flowsheet Row CARDIAC REHAB PHASE II EXERCISE from 11/29/2022 in [place] CARDIAC REHABILITATION  Date 11/01/22  Educator [facility]  Instruction Review Code 1- Verbalizes Understanding       Smoking Cessation / COPD -Discuss different methods to quit smoking, the health benefits of quitting smoking, and the definition of COPD. Flowsheet Row CARDIAC REHAB PHASE II EXERCISE from 11/29/2022 in [place] CARDIAC REHABILITATION  Date 11/29/22  Educator [facility]  Instruction Review Code 1- Verbalizes Understanding       Nutrition I: Fats -Discuss the types of cholesterol, what cholesterol does to the heart, and how cholesterol levels can be controlled. Flowsheet Row CARDIAC REHAB PHASE II EXERCISE from 11/29/2022 in [place] CARDIAC REHABILITATION  Date 11/08/22  Educator HB       Nutrition II: Labels -Discuss the different components of food labels and how to read food  label Flowsheet Row CARDIAC REHAB PHASE II EXERCISE from 11/29/2022 in [place] [facility] CARDIAC REHABILITATION  Date 11/08/22  Educator HB       Heart Parts/Heart Disease and PAD -Discuss the anatomy of the heart, the pathway of blood circulation through the heart, and these are affected by heart disease.   Stress I: Signs and Symptoms -Discuss the causes of stress, how stress may lead to anxiety and depression, and ways to limit stress. Flowsheet Row CARDIAC REHAB PHASE II EXERCISE from 11/29/2022 in [place] CARDIAC REHABILITATION  Date 11/15/22  Educator HB  Instruction Review Code 1- Verbalizes Understanding       Stress II: Relaxation -Discuss different types of relaxation techniques to limit stress. Flowsheet Row CARDIAC REHAB PHASE II EXERCISE from 11/29/2022 in [place] CARDIAC REHABILITATION  Date 11/21/22  Educator HB  Instruction Review Code 1- Verbalizes Understanding       Warning Signs of Stroke / TIA -Discuss definition of a stroke, what the signs and symptoms are of a stroke, and how to identify when someone is having stroke.   Knowledge Questionnaire Score:  Knowledge Questionnaire Score - 10/17/22 1024       Knowledge Questionnaire Score   Pre Score 21/26             Core Components/Risk Factors/Patient Goals at Admission:  Personal Goals and Risk Factors at Admission - 10/17/22 1006       Core Components/Risk Factors/Patient Goals on Admission    Weight Management Yes    Intervention Weight Management: Develop a combined nutrition and exercise program designed to reach desired caloric intake, while maintaining appropriate intake of nutrient and fiber, sodium and fats, and appropriate energy expenditure required for the weight goal.;Weight Management: Provide education and appropriate resources to help participant work on and attain dietary goals.    Expected Outcomes Short Term: Continue to assess and modify interventions until short term weight is  achieved;Long Term: Adherence to nutrition and physical activity/exercise program aimed toward attainment of established weight goal    Diabetes Yes    Intervention Provide education about signs/symptoms and action to take for hypo/hyperglycemia.;Provide education about proper nutrition, including hydration, and aerobic/resistive exercise prescription along with prescribed medications to achieve blood glucose in normal ranges: Fasting glucose 65-99 mg/dL    Expected Outcomes Short Term: Participant verbalizes understanding of the signs/symptoms and immediate care of hyper/hypoglycemia, proper foot care and importance of medication, aerobic/resistive exercise and nutrition plan for blood glucose control.;Long Term: Attainment of HbA1C < 7%.    Hypertension Yes    Intervention Provide education on lifestyle modifcations including regular physical activity/exercise, weight management, moderate sodium restriction and increased consumption of fresh fruit, vegetables, and low fat dairy, alcohol moderation, and smoking cessation.;Monitor prescription use compliance.    Expected Outcomes Long Term: Maintenance of blood pressure at goal levels.;Short Term: Continued assessment and intervention until BP is < 140/76mm HG in hypertensive participants. < 130/70mm HG in hypertensive participants with diabetes, heart failure or chronic kidney disease.    Lipids Yes    Intervention Provide education and support for participant on nutrition & aerobic/resistive exercise along with prescribed medications to achieve LDL 70mg , HDL >40mg .    Expected Outcomes Short Term: Participant states understanding of desired cholesterol values and is compliant with medications prescribed. Participant is following exercise prescription and nutrition guidelines.;Long Term: Cholesterol controlled  with medications as prescribed, with individualized exercise RX and with personalized nutrition plan. Value goals: LDL < 70mg , HDL > 40 mg.              Core Components/Risk Factors/Patient Goals Review:   Goals and Risk Factor Review     Row Name 12/04/22 1441             Core Components/Risk Factors/Patient Goals Review   Personal Goals Review Diabetes;Weight Management/Obesity       Review He has been watching his diabetes at stated that he use to have to wake up due to sugar to low. Since starting the program he has been eating more at night to make sure he does not drop at night. His BP has been WNL and at his yearly checkup with his PCP it was good. He lost 45 lbs during his events and wants to focus on toning and building his muscles back after being in bed after his events.       Expected Outcomes Short term: eating healthy for weight and toneing long term: continue to exercise for weight, BP and diabetes                Core Components/Risk Factors/Patient Goals at Discharge (Final Review):   Goals and Risk Factor Review - 12/04/22 1441       Core Components/Risk Factors/Patient Goals Review   Personal Goals Review Diabetes;Weight Management/Obesity    Review He has been watching his diabetes at stated that he use to have to wake up due to sugar to low. Since starting the program he has been eating more at night to make sure he does not drop at night. His BP has been WNL and at his yearly checkup with his PCP it was good. He lost 45 lbs during his events and wants to focus on toning and building his muscles back after being in bed after his events.    Expected Outcomes Short term: eating healthy for weight and toneing long term: continue to exercise for weight, BP and diabetes             ITP Comments:  ITP Comments     Row Name 10/17/22 1155 10/18/22 1525 11/08/22 0936 12/06/22 1136     ITP Comments Completed virtual orientation today.  EP evaluation is scheduled for Wednesday July 24 at 1400.  Documentation for diagnosis can be found in [facility] encounter 03/28/22 and office visit 10/11/22. Patient arrived  for 1st visit/orientation/education at 1400. Patient was referred to CR by Nona Dell due to NSTEMI/DES. During orientation advised patient on arrival and appointment times what to wear, what to do before, during and after exercise. Reviewed attendance and class policy.  Pt is scheduled to return Cardiac Rehab on 10/20/22 at 1445. Pt was advised to come to class 15 minutes before class starts.  Discussed RPE/Dpysnea scales. Patient participated in warm up stretches. Patient was able to complete 6 minute walk test.  Telemetry:NSR with PAC's with occasional pauses. Patient was measured for the equipment. Discussed equipment safety with patient. Took patient pre-anthropometric measurements. Patient finished visit at 1515. 30 day review completed. ITP sent to Dr. Dina Rich, Medical Director of Cardiac Rehab. Continue with ITP unless changes are made by physician. 30 day review completed. ITP sent to Dr. Dina Rich, Medical Director of Cardiac Rehab. Continue with ITP unless changes are made by physician.             Comments: 30 day review

## 2022-12-06 NOTE — Progress Notes (Signed)
Daily Session Note  Patient Details  Name: Arthur Wilson MRN: [id number] Date of Birth: 1945/11/26 Referring Provider:   Flowsheet Row CARDIAC REHAB PHASE II ORIENTATION from 10/18/2022 in [facility] CARDIAC REHABILITATION  Referring Provider Dr. Jomarie Longs       Encounter Date: 12/06/2022  Check In:  Session Check In - 12/06/22 1400       Check-In   Supervising physician immediately available to respond to emergencies [facility] MD immediately available    Physician(s) Dr. Diona Browner    Location [facility]    Staff Present Ross Ludwig, BS, Exercise Physiologist;Olanda Downie BSN, RN;Daphyne Daphine Deutscher, RN, BSN;Jessica Hawkins, MA, RCEP, CCRP, CCET    Virtual Visit No    Medication changes reported     No    Fall or balance concerns reported    No    Tobacco Cessation No Change    Warm-up and Cool-down Performed on first and last piece of equipment    Resistance Training Performed Yes    VAD Patient? No    PAD/SET Patient? No      Pain Assessment   Currently in Pain? No/denies    Multiple Pain Sites No             Capillary Blood Glucose: No results found for this or any previous visit (from the past 24 hour(s)).    Social History   Tobacco Use  Smoking Status Never  Smokeless Tobacco Never    Goals Met:  Independence with exercise equipment Exercise tolerated well No report of concerns or symptoms today Strength training completed today  Goals Unmet:  Not Applicable  Comments: [name] able to follow exercise prescription today without complaint.  Will continue to monitor for progression.    Dr. Dina Rich is Medical Director for [facility] Cardiac Rehab

## 2022-12-08 ENCOUNTER — Encounter (HOSPITAL_COMMUNITY): Payer: Medicare Other

## 2022-12-08 ENCOUNTER — Encounter (HOSPITAL_COMMUNITY)
Admission: RE | Admit: 2022-12-08 | Discharge: 2022-12-08 | Disposition: A | Discharge status: Home / Self Care | Payer: Medicare Other | Source: Ambulatory Visit | Attending: Cardiology | Admitting: Cardiology

## 2022-12-08 DIAGNOSIS — I214 Non-ST elevation (NSTEMI) myocardial infarction: Secondary | ICD-10-CM

## 2022-12-08 DIAGNOSIS — Z955 Presence of coronary angioplasty implant and graft: Secondary | ICD-10-CM

## 2022-12-08 NOTE — Progress Notes (Signed)
Daily Session Note  Patient Details  Name: Arthur Wilson MRN: [id number] Date of Birth: [birth year] Referring Provider:   Flowsheet Row CARDIAC REHAB PHASE II ORIENTATION from 10/18/2022 in [facility] CARDIAC REHABILITATION  Referring Provider Dr. Jomarie Longs       Encounter Date: 12/08/2022  Check In:  Session Check In - 12/08/22 1435       Check-In   Supervising physician immediately available to respond to emergencies [facility] MD immediately available    Physician(s) Dr. Diona Browner    Location [facility]    Staff Present Ross Ludwig, BS, Exercise Physiologist;Jessilynn Taft, RN;Debra Laural Benes, RN, Pleas Koch, RN, BSN    Virtual Visit No    Medication changes reported     No    Fall or balance concerns reported    Yes    Comments History of falls; unsteady gait    Tobacco Cessation No Change    Warm-up and Cool-down Performed on first and last piece of equipment    Resistance Training Performed Yes    VAD Patient? No    PAD/SET Patient? No      Pain Assessment   Currently in Pain? No/denies    Multiple Pain Sites No             Capillary Blood Glucose: No results found for this or any previous visit (from the past 24 hour(s)).    Social History   Tobacco Use  Smoking Status Never  Smokeless Tobacco Never    Goals Met:  Independence with exercise equipment Exercise tolerated well No report of concerns or symptoms today  Goals Unmet:  Not Applicable  Comments: Pt able to follow exercise prescription today without complaint.  Will continue to monitor for progression.    Dr. Dina Rich is Medical Director for [facility] Cardiac Rehab

## 2022-12-11 ENCOUNTER — Encounter (HOSPITAL_COMMUNITY): Payer: Medicare Other

## 2022-12-11 ENCOUNTER — Encounter (HOSPITAL_COMMUNITY)
Admission: RE | Admit: 2022-12-11 | Discharge: 2022-12-11 | Disposition: A | Discharge status: Home / Self Care | Payer: Medicare Other | Source: Ambulatory Visit | Attending: Cardiology

## 2022-12-11 DIAGNOSIS — Z955 Presence of coronary angioplasty implant and graft: Secondary | ICD-10-CM | POA: Diagnosis not present

## 2022-12-11 DIAGNOSIS — I214 Non-ST elevation (NSTEMI) myocardial infarction: Secondary | ICD-10-CM | POA: Diagnosis not present

## 2022-12-11 NOTE — Progress Notes (Signed)
Daily Session Note  Patient Details  Name: Arthur Wilson MRN: [id number] Date of Birth: 08-15-1945 Referring Provider:   Flowsheet Row CARDIAC REHAB PHASE II ORIENTATION from 10/18/2022 in [facility] CARDIAC REHABILITATION  Referring Provider Dr. Jomarie Longs       Encounter Date: 12/11/2022  Check In:  Session Check In - 12/11/22 1430       Check-In   Supervising physician immediately available to respond to emergencies See telemetry face sheet for immediately available MD    Location [facility]    Staff Present Ross Ludwig, BS, Exercise Physiologist;Debra Laural Benes, RN, BSN;Miraya Cudney, RN;Jessica [place], MA, RCEP, CCRP, CCET    Virtual Visit No    Medication changes reported     No    Fall or balance concerns reported    Yes    Comments History of falls; unsteady gait    Tobacco Cessation No Change    Warm-up and Cool-down Performed on first and last piece of equipment    Resistance Training Performed Yes    VAD Patient? No    PAD/SET Patient? No      Pain Assessment   Currently in Pain? No/denies    Multiple Pain Sites No             Capillary Blood Glucose: No results found for this or any previous visit (from the past 24 hour(s)).    Social History   Tobacco Use  Smoking Status Never  Smokeless Tobacco Never    Goals Met:  Independence with exercise equipment Exercise tolerated well No report of concerns or symptoms today  Goals Unmet:  Not Applicable  Comments: Pt able to follow exercise prescription today without complaint.  Will continue to monitor for progression.    Dr. Dina Rich is Medical Director for [facility] Cardiac Rehab

## 2022-12-13 ENCOUNTER — Encounter (HOSPITAL_COMMUNITY): Payer: Medicare Other

## 2022-12-13 ENCOUNTER — Encounter (HOSPITAL_COMMUNITY)
Admission: RE | Admit: 2022-12-13 | Discharge: 2022-12-13 | Disposition: A | Discharge status: Home / Self Care | Payer: Medicare Other | Source: Ambulatory Visit | Attending: Cardiology

## 2022-12-13 DIAGNOSIS — Z955 Presence of coronary angioplasty implant and graft: Secondary | ICD-10-CM

## 2022-12-13 DIAGNOSIS — I214 Non-ST elevation (NSTEMI) myocardial infarction: Secondary | ICD-10-CM

## 2022-12-13 NOTE — Progress Notes (Signed)
Daily Session Note  Patient Details  Name: Arthur Wilson MRN: [id number] Date of Birth: 11-24-1945 Referring Provider:   Flowsheet Row CARDIAC REHAB PHASE II ORIENTATION from 10/18/2022 in [facility] CARDIAC REHABILITATION  Referring Provider Dr. Jomarie Longs       Encounter Date: 12/13/2022  Check In:  Session Check In - 12/13/22 1400       Check-In   Supervising physician immediately available to respond to emergencies See telemetry face sheet for immediately available MD    Location [facility]    Staff Present Ross Ludwig, BS, Exercise Physiologist;Neal Oshea Daphine Deutscher, RN, BSN;Jessica Hawkins, MA, RCEP, CCRP, CCET    Virtual Visit No    Medication changes reported     No    Fall or balance concerns reported    Yes    Comments History of falls; unsteady gait    Tobacco Cessation No Change    Warm-up and Cool-down Performed on first and last piece of equipment    Resistance Training Performed Yes    VAD Patient? No      Pain Assessment   Currently in Pain? No/denies             Capillary Blood Glucose: No results found for this or any previous visit (from the past 24 hour(s)).    Social History   Tobacco Use  Smoking Status Never  Smokeless Tobacco Never    Goals Met:  Independence with exercise equipment Exercise tolerated well No report of concerns or symptoms today Strength training completed today  Goals Unmet:  Not Applicable  Comments: Pt able to follow exercise prescription today without complaint.  Will continue to monitor for progression.    Dr. Dina Rich is Medical Director for [facility] Cardiac Rehab

## 2022-12-15 ENCOUNTER — Encounter (HOSPITAL_COMMUNITY): Payer: Medicare Other

## 2022-12-15 ENCOUNTER — Encounter (HOSPITAL_COMMUNITY)
Admission: RE | Admit: 2022-12-15 | Discharge: 2022-12-15 | Disposition: A | Discharge status: Home / Self Care | Payer: Medicare Other | Source: Ambulatory Visit | Attending: Cardiology | Admitting: Cardiology

## 2022-12-15 DIAGNOSIS — I214 Non-ST elevation (NSTEMI) myocardial infarction: Secondary | ICD-10-CM | POA: Diagnosis not present

## 2022-12-15 DIAGNOSIS — Z955 Presence of coronary angioplasty implant and graft: Secondary | ICD-10-CM | POA: Diagnosis not present

## 2022-12-15 NOTE — Progress Notes (Signed)
Daily Session Note  Patient Details  Name: Arthur Wilson MRN: [id number] Date of Birth: [birth year] Referring Provider:   Flowsheet Row CARDIAC REHAB PHASE II ORIENTATION from 10/18/2022 in [facility] CARDIAC REHABILITATION  Referring Provider Dr. Jomarie Longs       Encounter Date: 12/15/2022  Check In:  Session Check In - 12/15/22 1430       Check-In   Supervising physician immediately available to respond to emergencies See telemetry face sheet for immediately available MD    Location [facility]    Staff Present Ross Ludwig, BS, Exercise Physiologist;Rayon Mcchristian, RN;Daphyne Daphine Deutscher, RN, Neal Dy, RN, BSN    Virtual Visit No    Medication changes reported     No    Fall or balance concerns reported    Yes    Comments History of falls; unsteady gait    Tobacco Cessation No Change    Warm-up and Cool-down Performed on first and last piece of equipment    Resistance Training Performed Yes    VAD Patient? No    PAD/SET Patient? No      Pain Assessment   Currently in Pain? No/denies    Multiple Pain Sites No             Capillary Blood Glucose: No results found for this or any previous visit (from the past 24 hour(s)).    Social History   Tobacco Use  Smoking Status Never  Smokeless Tobacco Never    Goals Met:  Independence with exercise equipment Exercise tolerated well No report of concerns or symptoms today  Goals Unmet:  Not Applicable  Comments: Pt able to follow exercise prescription today without complaint.  Will continue to monitor for progression.    Dr. Dina Rich is Medical Director for [facility] Cardiac Rehab

## 2022-12-18 ENCOUNTER — Encounter (HOSPITAL_COMMUNITY)
Admission: RE | Admit: 2022-12-18 | Discharge: 2022-12-18 | Disposition: A | Discharge status: Home / Self Care | Payer: Medicare Other | Source: Ambulatory Visit | Attending: Cardiology | Admitting: Cardiology

## 2022-12-18 ENCOUNTER — Encounter (HOSPITAL_COMMUNITY): Payer: Medicare Other

## 2022-12-18 DIAGNOSIS — I214 Non-ST elevation (NSTEMI) myocardial infarction: Secondary | ICD-10-CM

## 2022-12-18 DIAGNOSIS — Z955 Presence of coronary angioplasty implant and graft: Secondary | ICD-10-CM | POA: Diagnosis not present

## 2022-12-18 NOTE — Progress Notes (Signed)
Daily Session Note  Patient Details  Name: Arthur Wilson MRN: [id number] Date of Birth: April 20, 1945 Referring Provider:   Flowsheet Row CARDIAC REHAB PHASE II ORIENTATION from 10/18/2022 in [facility] CARDIAC REHABILITATION  Referring Provider Dr. Jomarie Longs       Encounter Date: 12/18/2022  Check In:  Session Check In - 12/18/22 1430       Check-In   Supervising physician immediately available to respond to emergencies See telemetry face sheet for immediately available MD    Location [facility]    Staff Present Ross Ludwig, BS, Exercise Physiologist;Daphyne Daphine Deutscher, RN, BSN;Jessica Hawkins, MA, RCEP, CCRP, CCET    Virtual Visit No    Medication changes reported     No    Fall or balance concerns reported    Yes    Comments History of falls; unsteady gait    Tobacco Cessation No Change    Warm-up and Cool-down Performed on first and last piece of equipment    Resistance Training Performed Yes    VAD Patient? No    PAD/SET Patient? No      Pain Assessment   Currently in Pain? No/denies    Multiple Pain Sites No             Capillary Blood Glucose: No results found for this or any previous visit (from the past 24 hour(s)).    Social History   Tobacco Use  Smoking Status Never  Smokeless Tobacco Never    Goals Met:  Independence with exercise equipment Exercise tolerated well No report of concerns or symptoms today Strength training completed today  Goals Unmet:  Not Applicable  Comments: Pt able to follow exercise prescription today without complaint.  Will continue to monitor for progression.    Dr. Dina Rich is Medical Director for [facility] Cardiac Rehab

## 2022-12-20 ENCOUNTER — Encounter (HOSPITAL_COMMUNITY): Payer: Medicare Other

## 2022-12-20 ENCOUNTER — Encounter (HOSPITAL_COMMUNITY)
Admission: RE | Admit: 2022-12-20 | Discharge: 2022-12-20 | Disposition: A | Discharge status: Home / Self Care | Payer: Medicare Other | Source: Ambulatory Visit | Attending: Cardiology | Admitting: Cardiology

## 2022-12-20 DIAGNOSIS — I214 Non-ST elevation (NSTEMI) myocardial infarction: Secondary | ICD-10-CM

## 2022-12-20 DIAGNOSIS — Z955 Presence of coronary angioplasty implant and graft: Secondary | ICD-10-CM | POA: Diagnosis not present

## 2022-12-20 NOTE — Progress Notes (Signed)
Daily Session Note  Patient Details  Name: Arthur Wilson MRN: [id number] Date of Birth: 05-02-1945 Referring Provider:   Flowsheet Row CARDIAC REHAB PHASE II ORIENTATION from 10/18/2022 in [facility] CARDIAC REHABILITATION  Referring Provider Dr. Jomarie Longs       Encounter Date: 12/20/2022  Check In:  Session Check In - 12/20/22 1400       Check-In   Supervising physician immediately available to respond to emergencies See telemetry face sheet for immediately available MD    Location [facility]    Staff Present Ross Ludwig, BS, Exercise Physiologist;Wilbern Pennypacker Daphine Deutscher, RN, BSN;Jessica Hawkins, MA, RCEP, CCRP, CCET    Virtual Visit No    Medication changes reported     No    Fall or balance concerns reported    Yes    Comments History of falls; unsteady gait    Tobacco Cessation No Change    Warm-up and Cool-down Performed on first and last piece of equipment    Resistance Training Performed Yes    VAD Patient? No      Pain Assessment   Currently in Pain? No/denies             Capillary Blood Glucose: No results found for this or any previous visit (from the past 24 hour(s)).    Social History   Tobacco Use  Smoking Status Never  Smokeless Tobacco Never    Goals Met:  Independence with exercise equipment Exercise tolerated well No report of concerns or symptoms today Strength training completed today  Goals Unmet:  Not Applicable  Comments: Pt able to follow exercise prescription today without complaint.  Will continue to monitor for progression.    Dr. Dina Rich is Medical Director for [facility] Cardiac Rehab

## 2022-12-20 NOTE — Progress Notes (Signed)
I have reviewed a Home Exercise Prescription with Arthur Wilson . Arthur Wilson is not currently exercising at home but is planning to start .  The patient was advised to walk and ride his stationary bike 3-5 days a week for 30-45 minutes.  Arthur Wilson and I discussed how to progress their exercise prescription.  The patient stated that their goals were to build endruance and strength .  The patient stated that they understand the exercise prescription.  We reviewed exercise guidelines, target heart rate during exercise, RPE Scale, weather conditions, NTG use, endpoints for exercise, warmup and cool down.  Patient is encouraged to come to me with any questions. I will continue to follow up with the patient to assist them with progression and safety.

## 2022-12-22 ENCOUNTER — Encounter (HOSPITAL_COMMUNITY): Payer: Medicare Other

## 2022-12-22 ENCOUNTER — Encounter (HOSPITAL_COMMUNITY)
Admission: RE | Admit: 2022-12-22 | Discharge: 2022-12-22 | Disposition: A | Discharge status: Home / Self Care | Payer: Medicare Other | Source: Ambulatory Visit | Attending: Cardiology | Admitting: Cardiology

## 2022-12-22 VITALS — Ht 69.0 in | Wt 202.8 lb

## 2022-12-22 DIAGNOSIS — I214 Non-ST elevation (NSTEMI) myocardial infarction: Secondary | ICD-10-CM

## 2022-12-22 DIAGNOSIS — Z955 Presence of coronary angioplasty implant and graft: Secondary | ICD-10-CM | POA: Diagnosis not present

## 2022-12-22 NOTE — Progress Notes (Signed)
Daily Session Note  Patient Details  Name: Arthur Wilson MRN: [id number] Date of Birth: 02/13/1946 Referring Provider:   Flowsheet Row CARDIAC REHAB PHASE II ORIENTATION from 10/18/2022 in [facility] CARDIAC REHABILITATION  Referring Provider Dr. Jomarie Longs       Encounter Date: 12/22/2022  Check In:  Session Check In - 12/22/22 1430       Check-In   Supervising physician immediately available to respond to emergencies See telemetry face sheet for immediately available MD    Location [facility]    Staff Present Ross Ludwig, BS, Exercise Physiologist;Jericho Alcorn, Margarite Gouge, RN, BSN    Virtual Visit No    Medication changes reported     No    Fall or balance concerns reported    Yes    Comments History of falls; unsteady gait    Tobacco Cessation No Change    Warm-up and Cool-down Performed on first and last piece of equipment    Resistance Training Performed Yes    VAD Patient? No    PAD/SET Patient? No      Pain Assessment   Currently in Pain? No/denies    Multiple Pain Sites No             Capillary Blood Glucose: No results found for this or any previous visit (from the past 24 hour(s)).    Social History   Tobacco Use  Smoking Status Never  Smokeless Tobacco Never    Goals Met:  Independence with exercise equipment Exercise tolerated well No report of concerns or symptoms today Strength training completed today  Goals Unmet:  Not Applicable  Comments: Pt able to follow exercise prescription today without complaint.  Will continue to monitor for progression.    Dr. Dina Rich is Medical Director for [facility] Cardiac Rehab

## 2022-12-25 ENCOUNTER — Encounter (HOSPITAL_COMMUNITY): Payer: Medicare Other

## 2022-12-27 ENCOUNTER — Encounter (HOSPITAL_COMMUNITY): Payer: Medicare Other

## 2022-12-27 ENCOUNTER — Encounter (HOSPITAL_COMMUNITY)
Admission: RE | Admit: 2022-12-27 | Discharge: 2022-12-27 | Disposition: A | Discharge status: Home / Self Care | Payer: Medicare Other | Source: Ambulatory Visit | Attending: Cardiology | Admitting: Cardiology

## 2022-12-27 DIAGNOSIS — I214 Non-ST elevation (NSTEMI) myocardial infarction: Secondary | ICD-10-CM | POA: Insufficient documentation

## 2022-12-27 DIAGNOSIS — Z955 Presence of coronary angioplasty implant and graft: Secondary | ICD-10-CM | POA: Diagnosis not present

## 2022-12-27 NOTE — Progress Notes (Signed)
Arthur Wilson graduated today from  rehab with 36 sessions completed.  Details of the patient's exercise prescription and what He needs to do in order to continue the prescription and progress were discussed with patient.  Patient was given a copy of prescription and goals.  Patient verbalized understanding. Arthur Wilson plans to continue to exercise by walking at home.

## 2022-12-27 NOTE — Progress Notes (Signed)
Cardiac Individual Treatment Plan  Patient Details  Name: Arthur Wilson MRN: [id number] Date of Birth: [birth year] Referring Provider:   Flowsheet Row CARDIAC REHAB PHASE II ORIENTATION from 10/18/2022 in [facility] CARDIAC REHABILITATION  Referring Provider Dr. Jomarie Longs       Initial Encounter Date:  Flowsheet Row CARDIAC REHAB PHASE II ORIENTATION from 10/18/2022 in [place] CARDIAC REHABILITATION  Date 10/18/22       Visit Diagnosis: NSTEMI (non-ST elevated myocardial infarction) [facility])  Status post coronary artery stent placement  Patient's Home Medications on Admission:  Current Outpatient Medications:    acetaminophen (TYLENOL) 500 MG tablet, Take 1,000 mg by mouth every 6 (six) hours as needed for moderate pain., Disp: , Rfl:    ALPRAZolam (XANAX) 0.5 MG tablet, Take 0.5 tablets by mouth at bedtime as needed for anxiety or sleep., Disp: , Rfl:    atorvastatin (LIPITOR) 40 MG tablet, Take 1 tablet (40 mg total) by mouth daily., Disp: 30 tablet, Rfl: 0   Continuous Blood Gluc Sensor (DEXCOM G7 SENSOR) MISC, for 30 Days, Disp: , Rfl:    ELIQUIS 5 MG TABS tablet, Take 5 mg by mouth 2 (two) times daily., Disp: , Rfl:    empagliflozin (JARDIANCE) 10 MG TABS tablet, Take 1 tablet (10 mg total) by mouth daily. (Patient taking differently: Take 5 mg by mouth daily.), Disp: 30 tablet, Rfl: 0   fluticasone (FLONASE) 50 MCG/ACT nasal spray, Place 1 spray into both nostrils daily as needed for allergies., Disp: , Rfl:    furosemide (LASIX) 40 MG tablet, 1 tablet Oral once a week for 30 days, Disp: , Rfl:    HYDROcodone-acetaminophen (NORCO) 7.5-325 MG tablet, Take 0.5-1 tablets by mouth 3 (three) times daily as needed for moderate pain., Disp: , Rfl:    HYDROcodone-acetaminophen (NORCO/VICODIN) 5-325 MG tablet, Take 1 tablet by mouth every 6 (six) hours as needed for moderate pain., Disp: 20 tablet, Rfl: 0   metFORMIN (GLUCOPHAGE) 500 MG tablet, Take 500 mg by mouth at bedtime. (Patient  not taking: Reported on 10/11/2022), Disp: , Rfl:    pantoprazole (PROTONIX) 40 MG tablet, Take 1 tablet (40 mg total) by mouth daily at 12 noon., Disp: 30 tablet, Rfl: 0  Past Medical History: Past Medical History:  Diagnosis Date   Atrial fibrillation (HCC)    BPH (benign prostatic hyperplasia)    Cervical spondylosis without myelopathy    Coronary artery disease    a. s/p NSTEMI in 03/2022 with DESx2 to RCA and staged orbital atherectomy/DES placement to proximal LAD   Diabetes mellitus without complication (HCC)    Essential hypertension    History of kidney stones    showed on CT scan   Impaired glucose tolerance    Inguinal hernia bilateral, non-recurrent    Myocardial infarction (HCC) 03/30/2022   OSA on CPAP    Testicular hypofunction     Tobacco Use: Social History   Tobacco Use  Smoking Status Never  Smokeless Tobacco Never    Labs: Review Flowsheet       Latest Ref Rng & Units 03/29/2022 03/30/2022 08/10/2022  Labs for ITP Cardiac and Pulmonary Rehab  Hemoglobin A1c 4.8 - 5.6 % 7.4  - 6.0   PH, Arterial 7.35 - 7.45 - 7.459  7.501  -  PCO2 arterial 32 - 48 mmHg - 42.9  38.9  -  Bicarbonate 20.0 - 28.0 mmol/L - 31.1  30.4  30.4  -  TCO2 22 - 32 mmol/L - 32  32  32  -  O2 Saturation % - 54  60  90  -    Details       Multiple values from one day are sorted in reverse-chronological order         Capillary Blood Glucose: Lab Results  Component Value Date   GLUCAP 120 (H) 08/17/2022   GLUCAP 120 (H) 08/17/2022   GLUCAP 101 (H) 08/17/2022   GLUCAP 132 (H) 08/10/2022   GLUCAP 118 (H) 04/04/2022     Exercise Target Goals: Exercise Program Goal: Individual exercise prescription set using results from initial 6 min walk test and THRR while considering  patient's activity barriers and safety.   Exercise Prescription Goal: Starting with aerobic activity 30 plus minutes a day, 3 days per week for initial exercise prescription. Provide home exercise  prescription and guidelines that participant acknowledges understanding prior to discharge.  Activity Barriers & Risk Stratification:  Activity Barriers & Cardiac Risk Stratification - 10/17/22 0945       Activity Barriers & Cardiac Risk Stratification   Activity Barriers Deconditioning;Muscular Weakness;Other (comment);Joint Problems;History of Falls;Incisional Pain;Back Problems    Comments two hernia repair surgeries in May 2024 still weak from being out for , knee pain from fall last year not sure whats wrong, calf on left leg cramping/painful, occassional low back pain, spndylosis    Cardiac Risk Stratification Moderate             6 Minute Walk:  6 Minute Walk     Row Name 10/18/22 1552 12/25/22 1536       6 Minute Walk   Phase Initial Discharge    Distance 800 feet 990 feet  Initial feet was 760    Distance Feet Change -- 230 ft    Walk Time 6 minutes 6 minutes    # of Rest Breaks 0 0    MPH 1.51 1.87    METS 1.59 1.89    RPE 12 13    Perceived Dyspnea  -- 0    VO2 Peak 5.59 6.62    Symptoms No No    Resting HR 80 bpm 69 bpm    Resting BP 136/68 100/60    Resting Oxygen Saturation  94 % 96 %    Exercise Oxygen Saturation  during 6 min walk 94 % 95 %    Max Ex. HR 93 bpm 83 bpm    Max Ex. BP 130/68 138/60    2 Minute Post BP 132/68 120/60             Oxygen Initial Assessment:   Oxygen Re-Evaluation:   Oxygen Discharge (Final Oxygen Re-Evaluation):   Initial Exercise Prescription:  Initial Exercise Prescription - 10/18/22 1500       Date of Initial Exercise RX and Referring Provider   Date 10/18/22    Referring Provider Dr. Jomarie Longs      Oxygen   Maintain Oxygen Saturation 88% or higher      Treadmill   MPH 1.5    Grade 0    Minutes 15      NuStep   Level 1    SPM 80    Minutes 15      Prescription Details   Frequency (times per week) 3    Duration Progress to 30 minutes of continuous aerobic without signs/symptoms of physical  distress      Intensity   THRR 40-80% of Max Heartrate 106-133    Ratings of Perceived Exertion 11-13    Perceived  Dyspnea 0-4      Resistance Training   Training Prescription Yes    Weight 3    Reps 10-15             Perform Capillary Blood Glucose checks as needed.  Exercise Prescription Changes:   Exercise Prescription Changes     Row Name 10/23/22 1500 11/08/22 1500 12/04/22 1500 12/20/22 1500       Response to Exercise   Blood Pressure (Admit) 122/60 116/60 108/64 82/42    Blood Pressure (Exercise) 128/58 -- -- 122/60    Blood Pressure (Exit) 120/60 120/60 96/60 112/44    Heart Rate (Admit) 69 bpm 56 bpm 63 bpm 58 bpm    Heart Rate (Exercise) 94 bpm 80 bpm 87 bpm 78 bpm    Heart Rate (Exit) 74 bpm 68 bpm 72 bpm 62 bpm    Rating of Perceived Exertion (Exercise) 12 12 13 13     Duration Continue with 30 min of aerobic exercise without signs/symptoms of physical distress. Continue with 30 min of aerobic exercise without signs/symptoms of physical distress. Continue with 30 min of aerobic exercise without signs/symptoms of physical distress. Continue with 30 min of aerobic exercise without signs/symptoms of physical distress.    Intensity THRR unchanged THRR unchanged THRR unchanged THRR unchanged      Progression   Progression Continue to progress workloads to maintain intensity without signs/symptoms of physical distress. Continue to progress workloads to maintain intensity without signs/symptoms of physical distress. Continue to progress workloads to maintain intensity without signs/symptoms of physical distress. Continue to progress workloads to maintain intensity without signs/symptoms of physical distress.      Resistance Training   Training Prescription Yes Yes Yes Yes    Weight 3 3 5 5  lbs    Reps 10-15 10-15 10-15 10-15      Treadmill   MPH 1.5 1.8 2 2.2    Grade 0 0 0 0    Minutes 15 15 15 15     METs 2.15 2.38 2.53 2.69      NuStep   Level 2 4 5 5      SPM 79 90 90 92    Minutes 15 15 15 15     METs 1.8 2.2 2.3 2.3      Home Exercise Plan   Plans to continue exercise at -- -- -- Home (comment)    Frequency -- -- -- Add 2 additional days to program exercise sessions.    Initial Home Exercises Provided -- -- -- 12/20/22      Oxygen   Maintain Oxygen Saturation 88% or higher 88% or higher 88% or higher --             Exercise Comments:   Exercise Comments     Row Name 12/20/22 1532 12/27/22 1500         Exercise Comments Reviewed home exercise with Leonette Most today. He is planning on using his stationary bike at home a walking. Alexis graduated today from  rehab with 36 sessions completed.  Details of the patient's exercise prescription and what He needs to do in order to continue the prescription and progress were discussed with patient.  Patient was given a copy of prescription and goals.  Patient verbalized understanding. Branton plans to continue to exercise by walking at home.               Exercise Goals and Review:   Exercise Goals     Row Name 10/18/22 1556  Exercise Goals   Increase Physical Activity Yes       Intervention Provide advice, education, support and counseling about physical activity/exercise needs.;Develop an individualized exercise prescription for aerobic and resistive training based on initial evaluation findings, risk stratification, comorbidities and participant's personal goals.       Expected Outcomes Short Term: Attend rehab on a regular basis to increase amount of physical activity.;Long Term: Add in home exercise to make exercise part of routine and to increase amount of physical activity.;Long Term: Exercising regularly at least 3-5 days a week.       Increase Strength and Stamina Yes       Intervention Provide advice, education, support and counseling about physical activity/exercise needs.;Develop an individualized exercise prescription for aerobic and resistive training based  on initial evaluation findings, risk stratification, comorbidities and participant's personal goals.       Expected Outcomes Short Term: Increase workloads from initial exercise prescription for resistance, speed, and METs.;Short Term: Perform resistance training exercises routinely during rehab and add in resistance training at home;Long Term: Improve cardiorespiratory fitness, muscular endurance and strength as measured by increased METs and functional capacity ( )       Able to understand and use rate of perceived exertion (RPE) scale Yes       Intervention Provide education and explanation on how to use RPE scale       Expected Outcomes Short Term: Able to use RPE daily in rehab to express subjective intensity level;Long Term:  Able to use RPE to guide intensity level when exercising independently       Knowledge and understanding of Target Heart Rate Range (THRR) Yes       Intervention Provide education and explanation of THRR including how the numbers were predicted and where they are located for reference       Expected Outcomes Short Term: Able to state/look up THRR;Long Term: Able to use THRR to govern intensity when exercising independently;Short Term: Able to use daily as guideline for intensity in rehab       Able to check pulse independently Yes       Intervention Provide education and demonstration on how to check pulse in carotid and radial arteries.;Review the importance of being able to check your own pulse for safety during independent exercise       Expected Outcomes Short Term: Able to explain why pulse checking is important during independent exercise;Long Term: Able to check pulse independently and accurately       Understanding of Exercise Prescription Yes       Intervention Provide education, explanation, and written materials on patient's individual exercise prescription       Expected Outcomes Short Term: Able to explain program exercise prescription;Long Term: Able to  explain home exercise prescription to exercise independently                Exercise Goals Re-Evaluation :  Exercise Goals Re-Evaluation     Row Name 10/24/22 0842 11/09/22 0951 12/04/22 1436         Exercise Goal Re-Evaluation   Exercise Goals Review -- Increase Physical Activity;Understanding of Exercise Prescription;Increase Strength and Stamina Increase Physical Activity;Increase Strength and Stamina;Understanding of Exercise Prescription     Comments Pt has just started rehab, he is tolerating exercise well and is increase his SPM each class Pt has increased his workload on the NuStep to level 4 and is exercising at an RPE of 12. Will continue to montior and progress as able. Carles  is doing great in rehab. He has increased his nustep level to 5 and is doing good. He said that he has noticed his strength increasing in his legs since when he started the program. He is able to get out and do more since his first day in the program     Expected Outcomes -- Short term: Increase workload on treadmill in the next two weeks   long term: continue to attend cardiac rehab sessions Short term: Increase workload on treadmill   long term: continue to attend cardiac rehab sessions               Discharge Exercise Prescription (Final Exercise Prescription Changes):  Exercise Prescription Changes - 12/20/22 1500       Response to Exercise   Blood Pressure (Admit) 82/42    Blood Pressure (Exercise) 122/60    Blood Pressure (Exit) 112/44    Heart Rate (Admit) 58 bpm    Heart Rate (Exercise) 78 bpm    Heart Rate (Exit) 62 bpm    Rating of Perceived Exertion (Exercise) 13    Duration Continue with 30 min of aerobic exercise without signs/symptoms of physical distress.    Intensity THRR unchanged      Progression   Progression Continue to progress workloads to maintain intensity without signs/symptoms of physical distress.      Resistance Training   Training Prescription Yes    Weight  5 lbs    Reps 10-15      Treadmill   MPH 2.2    Grade 0    Minutes 15    METs 2.69      NuStep   Level 5    SPM 92    Minutes 15    METs 2.3      Home Exercise Plan   Plans to continue exercise at Home (comment)    Frequency Add 2 additional days to program exercise sessions.    Initial Home Exercises Provided 12/20/22             Nutrition:  Target Goals: Understanding of nutrition guidelines, daily intake of sodium 1500mg , cholesterol 200mg , calories 30% from fat and 7% or less from saturated fats, daily to have 5 or more servings of fruits and vegetables.  Biometrics:  Pre Biometrics - 10/18/22 1557       Pre Biometrics   Height 5\' 9"  (1.753 m)    Weight 93 kg    Waist Circumference 42 inches    Hip Circumference 44 inches    Waist to Hip Ratio 0.95 %    BMI (Calculated) 30.26    Triceps Skinfold 15 mm    % Body Fat 29.5 %    Grip Strength 22.3 kg    Flexibility 0 in    Single Leg Stand 0 seconds             Post Biometrics - 12/25/22 1538        Post  Biometrics   Height 5\' 9"  (1.753 m)    Weight 92 kg    Waist Circumference 41 inches    Hip Circumference 44 inches    Waist to Hip Ratio 0.93 %    BMI (Calculated) 29.94    Grip Strength 22.5 kg    Flexibility 0 in    Single Leg Stand 0 seconds             Nutrition Therapy Plan and Nutrition Goals:  Nutrition Therapy & Goals - 10/17/22 1009  Intervention Plan   Intervention Prescribe, educate and counsel regarding individualized specific dietary modifications aiming towards targeted core components such as weight, hypertension, lipid management, diabetes, heart failure and other comorbidities.;Nutrition handout(s) given to patient.    Expected Outcomes Short Term Goal: Understand basic principles of dietary content, such as calories, fat, sodium, cholesterol and nutrients.             Nutrition Assessments:  MEDIFICTS Score Key: >=70 Need to make dietary changes  40-70  Heart Healthy Diet <= 40 Therapeutic Level Cholesterol Diet  Flowsheet Row CARDIAC VIRTUAL BASED CARE from 10/17/2022 in [facility] CARDIAC REHABILITATION  Picture Your Plate Total Score on Admission 63      Picture Your Plate Scores: <32 Unhealthy dietary pattern with much room for improvement. 41-50 Dietary pattern unlikely to meet recommendations for good health and room for improvement. 51-60 More healthful dietary pattern, with some room for improvement.  >60 Healthy dietary pattern, although there may be some specific behaviors that could be improved.    Nutrition Goals Re-Evaluation:  Nutrition Goals Re-Evaluation     Row Name 12/04/22 1448             Goals   Nutrition Goal healthy eating       Comment Jhase said that he has noticed that he has more of an appatie in the past couple of weeks. He eats more chicken with some fish and beef through the week. He loves to eat veggies and will eat them for meals. He eats beans when he eats veggies. He does not really like sweets. He eats some just to get his sugar up and will eat ice cream some days.       Expected Outcome Short term: contiue with healthy eating Long term: continue to watch eating portions and healthy eating                Nutrition Goals Discharge (Final Nutrition Goals Re-Evaluation):  Nutrition Goals Re-Evaluation - 12/04/22 1448       Goals   Nutrition Goal healthy eating    Comment Kaysen said that he has noticed that he has more of an appatie in the past couple of weeks. He eats more chicken with some fish and beef through the week. He loves to eat veggies and will eat them for meals. He eats beans when he eats veggies. He does not really like sweets. He eats some just to get his sugar up and will eat ice cream some days.    Expected Outcome Short term: contiue with healthy eating Long term: continue to watch eating portions and healthy eating             Psychosocial: Target Goals:  Acknowledge presence or absence of significant depression and/or stress, maximize coping skills, provide positive support system. Participant is able to verbalize types and ability to use techniques and skills needed for reducing stress and depression.  Initial Review & Psychosocial Screening:  Initial Psych Review & Screening - 10/17/22 1011       Initial Review   Current issues with Current Psychotropic Meds;Current Stress Concerns;Current Sleep Concerns    Source of Stress Concerns Chronic Illness;Unable to participate in former interests or hobbies;Unable to perform yard/household activities;Family    Comments recovering from surgery and feeling weak and unable to go, stressors over kids, wife recently diagnosed with lung cancer, hasn't been able to get out and about as much, uses CPAP at night, sleeps fairly well usually 5-6 hrs  Family Dynamics   Good Support System? Yes   wife, kids     Barriers   Psychosocial barriers to participate in program The patient should benefit from training in stress management and relaxation.;Psychosocial barriers identified (see note)      Screening Interventions   Interventions Encouraged to exercise;To provide support and resources with identified psychosocial needs;Provide feedback about the scores to participant    Expected Outcomes Short Term goal: Utilizing psychosocial counselor, staff and physician to assist with identification of specific Stressors or current issues interfering with healing process. Setting desired goal for each stressor or current issue identified.;Long Term Goal: Stressors or current issues are controlled or eliminated.;Short Term goal: Identification and review with participant of any Quality of Life or Depression concerns found by scoring the questionnaire.;Long Term goal: The participant improves quality of Life and PHQ9 Scores as seen by post scores and/or verbalization of changes             Quality of Life Scores:   Quality of Life - 10/17/22 1022       Quality of Life   Select Quality of Life      Quality of Life Scores   Health/Function Pre 21.6 %    Socioeconomic Pre 28.13 %    Psych/Spiritual Pre 28.29 %    Family Pre 12 %    GLOBAL Pre 23.06 %            Scores of 19 and below usually indicate a poorer quality of life in these areas.  A difference of  2-3 points is a clinically meaningful difference.  A difference of 2-3 points in the total score of the Quality of Life Index has been associated with significant improvement in overall quality of life, self-image, physical symptoms, and general health in studies assessing change in quality of life.  PHQ-9: Review Flowsheet       10/18/2022  Depression screen PHQ 2/9  Decreased Interest 0  Down, Depressed, Hopeless 0  PHQ - 2 Score 0  Altered sleeping 0  Tired, decreased energy 0  Change in appetite 0  Feeling bad or failure about yourself  0  Trouble concentrating 0  Moving slowly or fidgety/restless 0  Suicidal thoughts 0  PHQ-9 Score 0    Details           Interpretation of Total Score  Total Score Depression Severity:  1-4 = Minimal depression, 5-9 = Mild depression, 10-14 = Moderate depression, 15-19 = Moderately severe depression, 20-27 = Severe depression   Psychosocial Evaluation and Intervention:  Psychosocial Evaluation - 10/17/22 1024       Psychosocial Evaluation & Interventions   Interventions Stress management education;Relaxation education;Encouraged to exercise with the program and follow exercise prescription    Comments Kelden is coming into Cardiac Rehab after having a NSTEMI with 3 stents in January.  He was placed on hold for hernia repair surgery in May.  He has been having a hard recovery since surgery and lacking energy and stamina.  He has also noted that his R calf will wake him with pain at night.  His doctor wants him to get moving again.  He wants to work to get back to what he was doing prior  to his heart event.  He would like to be able to get out and about to go to store and help around house.  Last week was his first trip to store since surgery, and it fatigued him quickly.  He uses  xanan to sleep at night on occassion to calm his mind as he has lots of family stressors between wife and kids.  He states his kids have not bad the best choices and his wife was diagnoisised with lung cancer three years ago and her med was to extend her life for 5 years.  She is doing well, but he worries about her and long term for both of them.  He has no barriers to attending rehab.  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  He is generally a happy and positive man and tries not to dewell on the negative side of things or let things beyond his control bother him too much.    Expected Outcomes Short: Attend rehab to build up stamina Long: Continue to focus on the positive    Continue Psychosocial Services  Follow up required by staff             Psychosocial Re-Evaluation:  Psychosocial Re-Evaluation     Row Name 12/04/22 1439             Psychosocial Re-Evaluation   Comments Shields stated that he has been able to sleep longer and have a better sleep since starting the program and exercising. He stated that he does not have any more stressors in his life.       Expected Outcomes Short: continue to exercise for sleep and happiness. long term: continue to not have stressors in his life       Interventions Stress management education;Relaxation education;Encouraged to attend Cardiac Rehabilitation for the exercise       Continue Psychosocial Services  Follow up required by staff                Psychosocial Discharge (Final Psychosocial Re-Evaluation):  Psychosocial Re-Evaluation - 12/04/22 1439       Psychosocial Re-Evaluation   Comments Shiheem stated that he has been able to sleep longer and have a better sleep since starting the program and exercising. He stated that  he does not have any more stressors in his life.    Expected Outcomes Short: continue to exercise for sleep and happiness. long term: continue to not have stressors in his life    Interventions Stress management education;Relaxation education;Encouraged to attend Cardiac Rehabilitation for the exercise    Continue Psychosocial Services  Follow up required by staff             Vocational Rehabilitation: Provide vocational rehab assistance to qualifying candidates.   Vocational Rehab Evaluation & Intervention:  Vocational Rehab - 10/17/22 1002       Initial Vocational Rehab Evaluation & Intervention   Assessment shows need for Vocational Rehabilitation No   already back to work as pharmacist            Education: Education Goals: Education classes will be provided on a weekly basis, covering required topics. Participant will state understanding/return demonstration of topics presented.  Learning Barriers/Preferences:  Learning Barriers/Preferences - 10/17/22 1009       Learning Barriers/Preferences   Learning Barriers Sight   glasses for reading, L eye lens implant   Learning Preferences Skilled Demonstration;Written Material             Education Topics: Hypertension, Hypertension Reduction -Define heart disease and high blood pressure. Discus how high blood pressure affects the body and ways to reduce high blood pressure.   Exercise and Your Heart -Discuss why it is important to exercise, the FITT principles of  exercise, normal and abnormal responses to exercise, and how to exercise safely. Flowsheet Row CARDIAC REHAB PHASE II EXERCISE from 12/27/2022 in [place] CARDIAC REHABILITATION  Date 12/20/22  Educator [facility]  Instruction Review Code 2- Demonstrated Understanding       Angina -Discuss definition of angina, causes of angina, treatment of angina, and how to decrease risk of having angina. Flowsheet Row CARDIAC REHAB PHASE II EXERCISE from 12/27/2022 in  [place] CARDIAC REHABILITATION  Date 12/13/22  Educator [facility]  Instruction Review Code 2- Demonstrated Understanding       Cardiac Medications -Review what the following cardiac medications are used for, how they affect the body, and side effects that may occur when taking the medications.  Medications include Aspirin, Beta blockers, calcium channel blockers, ACE Inhibitors, angiotensin receptor blockers, diuretics, digoxin, and antihyperlipidemics. Flowsheet Row CARDIAC REHAB PHASE II EXERCISE from 12/27/2022 in [place] CARDIAC REHABILITATION  Date 11/22/22  Educator DJ  Instruction Review Code 1- Verbalizes Understanding       Congestive Heart Failure -Discuss the definition of CHF, how to live with CHF, the signs and symptoms of CHF, and how keep track of weight and sodium intake. Flowsheet Row CARDIAC REHAB PHASE II EXERCISE from 12/27/2022 in [place] CARDIAC REHABILITATION  Date 10/25/22  Educator HB  Instruction Review Code 1- Verbalizes Understanding       Heart Disease and Intimacy -Discus the effect sexual activity has on the heart, how changes occur during intimacy as we age, and safety during sexual activity. Flowsheet Row CARDIAC REHAB PHASE II EXERCISE from 12/27/2022 in [place] CARDIAC REHABILITATION  Date 11/01/22  Educator [facility]  Instruction Review Code 1- Verbalizes Understanding       Smoking Cessation / COPD -Discuss different methods to quit smoking, the health benefits of quitting smoking, and the definition of COPD. Flowsheet Row CARDIAC REHAB PHASE II EXERCISE from 12/27/2022 in [place] CARDIAC REHABILITATION  Date 11/29/22  Educator [facility]  Instruction Review Code 1- Verbalizes Understanding       Nutrition I: Fats -Discuss the types of cholesterol, what cholesterol does to the heart, and how cholesterol levels can be controlled. Flowsheet Row CARDIAC REHAB PHASE II EXERCISE from 12/27/2022 in [place] CARDIAC REHABILITATION  Date 11/08/22   Educator HB       Nutrition II: Labels -Discuss the different components of food labels and how to read food label Flowsheet Row CARDIAC REHAB PHASE II EXERCISE from 12/27/2022 in [place] [facility]AC REHABILITATION  Date 11/08/22  Educator HB       Heart Parts/Heart Disease and PAD -Discuss the anatomy of the heart, the pathway of blood circulation through the heart, and these are affected by heart disease.   Stress I: Signs and Symptoms -Discuss the causes of stress, how stress may lead to anxiety and depression, and ways to limit stress. Flowsheet Row CARDIAC REHAB PHASE II EXERCISE from 12/27/2022 in [place] CARDIAC REHABILITATION  Date 11/15/22  Educator HB  Instruction Review Code 1- Verbalizes Understanding       Stress II: Relaxation -Discuss different types of relaxation techniques to limit stress. Flowsheet Row CARDIAC REHAB PHASE II EXERCISE from 12/27/2022 in [place] CARDIAC REHABILITATION  Date 11/21/22  Educator HB  Instruction Review Code 1- Verbalizes Understanding       Warning Signs of Stroke / TIA -Discuss definition of a stroke, what the signs and symptoms are of a stroke, and how to identify when someone is having stroke.   Knowledge  Questionnaire Score:  Knowledge Questionnaire Score - 10/17/22 1024       Knowledge Questionnaire Score   Pre Score 21/26             Core Components/Risk Factors/Patient Goals at Admission:  Personal Goals and Risk Factors at Admission - 10/17/22 1006       Core Components/Risk Factors/Patient Goals on Admission    Weight Management Yes    Intervention Weight Management: Develop a combined nutrition and exercise program designed to reach desired caloric intake, while maintaining appropriate intake of nutrient and fiber, sodium and fats, and appropriate energy expenditure required for the weight goal.;Weight Management: Provide education and appropriate resources to help participant work on and attain  dietary goals.    Expected Outcomes Short Term: Continue to assess and modify interventions until short term weight is achieved;Long Term: Adherence to nutrition and physical activity/exercise program aimed toward attainment of established weight goal    Diabetes Yes    Intervention Provide education about signs/symptoms and action to take for hypo/hyperglycemia.;Provide education about proper nutrition, including hydration, and aerobic/resistive exercise prescription along with prescribed medications to achieve blood glucose in normal ranges: Fasting glucose 65-99 mg/dL    Expected Outcomes Short Term: Participant verbalizes understanding of the signs/symptoms and immediate care of hyper/hypoglycemia, proper foot care and importance of medication, aerobic/resistive exercise and nutrition plan for blood glucose control.;Long Term: Attainment of HbA1C < 7%.    Hypertension Yes    Intervention Provide education on lifestyle modifcations including regular physical activity/exercise, weight management, moderate sodium restriction and increased consumption of fresh fruit, vegetables, and low fat dairy, alcohol moderation, and smoking cessation.;Monitor prescription use compliance.    Expected Outcomes Long Term: Maintenance of blood pressure at goal levels.;Short Term: Continued assessment and intervention until BP is < 140/75mm HG in hypertensive participants. < 130/59mm HG in hypertensive participants with diabetes, heart failure or chronic kidney disease.    Lipids Yes    Intervention Provide education and support for participant on nutrition & aerobic/resistive exercise along with prescribed medications to achieve LDL 70mg , HDL >40mg .    Expected Outcomes Short Term: Participant states understanding of desired cholesterol values and is compliant with medications prescribed. Participant is following exercise prescription and nutrition guidelines.;Long Term: Cholesterol controlled with medications as  prescribed, with individualized exercise RX and with personalized nutrition plan. Value goals: LDL < 70mg , HDL > 40 mg.             Core Components/Risk Factors/Patient Goals Review:   Goals and Risk Factor Review     Row Name 12/04/22 1441             Core Components/Risk Factors/Patient Goals Review   Personal Goals Review Diabetes;Weight Management/Obesity       Review He has been watching his diabetes at stated that he use to have to wake up due to sugar to low. Since starting the program he has been eating more at night to make sure he does not drop at night. His BP has been WNL and at his yearly checkup with his PCP it was good. He lost 45 lbs during his events and wants to focus on toning and building his muscles back after being in bed after his events.       Expected Outcomes Short term: eating healthy for weight and toneing long term: continue to exercise for weight, BP and diabetes                Core Components/Risk Factors/Patient  Goals at Discharge (Final Review):   Goals and Risk Factor Review - 12/04/22 1441       Core Components/Risk Factors/Patient Goals Review   Personal Goals Review Diabetes;Weight Management/Obesity    Review He has been watching his diabetes at stated that he use to have to wake up due to sugar to low. Since starting the program he has been eating more at night to make sure he does not drop at night. His BP has been WNL and at his yearly checkup with his PCP it was good. He lost 45 lbs during his events and wants to focus on toning and building his muscles back after being in bed after his events.    Expected Outcomes Short term: eating healthy for weight and toneing long term: continue to exercise for weight, BP and diabetes             ITP Comments:  ITP Comments     Row Name 10/17/22 1155 10/18/22 1525 11/08/22 0936 12/06/22 1136 12/27/22 1459   ITP Comments Completed virtual orientation today.  EP evaluation is scheduled for  Wednesday July 24 at 1400.  Documentation for diagnosis can be found in [facility] encounter 03/28/22 and office visit 10/11/22. Patient arrived for 1st visit/orientation/education at 1400. Patient was referred to CR by Nona Dell due to NSTEMI/DES. During orientation advised patient on arrival and appointment times what to wear, what to do before, during and after exercise. Reviewed attendance and class policy.  Pt is scheduled to return Cardiac Rehab on 10/20/22 at 1445. Pt was advised to come to class 15 minutes before class starts.  Discussed RPE/Dpysnea scales. Patient participated in warm up stretches. Patient was able to complete 6 minute walk test.  Telemetry:NSR with PAC's with occasional pauses. Patient was measured for the equipment. Discussed equipment safety with patient. Took patient pre-anthropometric measurements. Patient finished visit at 1515. 30 day review completed. ITP sent to Dr. Dina Rich, Medical Director of Cardiac Rehab. Continue with ITP unless changes are made by physician. 30 day review completed. ITP sent to Dr. Dina Rich, Medical Director of Cardiac Rehab. Continue with ITP unless changes are made by physician. Siaosi graduated today from  rehab with 36 sessions completed.  Details of the patient's exercise prescription and what He needs to do in order to continue the prescription and progress were discussed with patient.  Patient was given a copy of prescription and goals.  Patient verbalized understanding. Chrishawn plans to continue to exercise by walking at home.            Comments: Discharge ITP

## 2022-12-27 NOTE — Progress Notes (Signed)
Discharge Progress Report  Patient Details  Name: Arthur Wilson MRN: [id number] Date of Birth: 1946/02/06 Referring Provider:   Flowsheet Row CARDIAC REHAB PHASE II ORIENTATION from 10/18/2022 in [facility] CARDIAC REHABILITATION  Referring Provider Dr. Jomarie Longs        Number of Visits: 36  Reason for Discharge:  Patient reached a stable level of exercise. Patient independent in their exercise. Patient has met program and personal goals.  Smoking History:  Social History   Tobacco Use  Smoking Status Never  Smokeless Tobacco Never    Diagnosis:  NSTEMI (non-ST elevated myocardial infarction) (HCC)  Status post coronary artery stent placement  Initial Exercise Prescription:  Initial Exercise Prescription - 10/18/22 1500       Date of Initial Exercise RX and Referring Provider   Date 10/18/22    Referring Provider Dr. Jomarie Longs      Oxygen   Maintain Oxygen Saturation 88% or higher      Treadmill   MPH 1.5    Grade 0    Minutes 15      NuStep   Level 1    SPM 80    Minutes 15      Prescription Details   Frequency (times per week) 3    Duration Progress to 30 minutes of continuous aerobic without signs/symptoms of physical distress      Intensity   THRR 40-80% of Max Heartrate 106-133    Ratings of Perceived Exertion 11-13    Perceived Dyspnea 0-4      Resistance Training   Training Prescription Yes    Weight 3    Reps 10-15             Discharge Exercise Prescription (Final Exercise Prescription Changes):  Exercise Prescription Changes - 12/20/22 1500       Response to Exercise   Blood Pressure (Admit) 82/42    Blood Pressure (Exercise) 122/60    Blood Pressure (Exit) 112/44    Heart Rate (Admit) 58 bpm    Heart Rate (Exercise) 78 bpm    Heart Rate (Exit) 62 bpm    Rating of Perceived Exertion (Exercise) 13    Duration Continue with 30 min of aerobic exercise without signs/symptoms of physical distress.    Intensity THRR unchanged       Progression   Progression Continue to progress workloads to maintain intensity without signs/symptoms of physical distress.      Resistance Training   Training Prescription Yes    Weight 5 lbs    Reps 10-15      Treadmill   MPH 2.2    Grade 0    Minutes 15    METs 2.69      NuStep   Level 5    SPM 92    Minutes 15    METs 2.3      Home Exercise Plan   Plans to continue exercise at Home (comment)    Frequency Add 2 additional days to program exercise sessions.    Initial Home Exercises Provided 12/20/22             Functional Capacity:  6 Minute Walk     Row Name 10/18/22 1552 12/25/22 1536       6 Minute Walk   Phase Initial Discharge    Distance 800 feet 990 feet  Initial feet was 760    Distance Feet Change -- 230 ft    Walk Time 6 minutes 6 minutes    #  of Rest Breaks 0 0    MPH 1.51 1.87    METS 1.59 1.89    RPE 12 13    Perceived Dyspnea  -- 0    VO2 Peak 5.59 6.62    Symptoms No No    Resting HR 80 bpm 69 bpm    Resting BP 136/68 100/60    Resting Oxygen Saturation  94 % 96 %    Exercise Oxygen Saturation  during 6 min walk 94 % 95 %    Max Ex. HR 93 bpm 83 bpm    Max Ex. BP 130/68 138/60    2 Minute Post BP 132/68 120/60            Psychological, QOL, Others - Outcomes: PHQ 2/9:    10/18/2022    2:44 PM  Depression screen PHQ 2/9  Decreased Interest 0  Down, Depressed, Hopeless 0  PHQ - 2 Score 0  Altered sleeping 0  Tired, decreased energy 0  Change in appetite 0  Feeling bad or failure about yourself  0  Trouble concentrating 0  Moving slowly or fidgety/restless 0  Suicidal thoughts 0  PHQ-9 Score 0     Nutrition & Weight - Outcomes:  Pre Biometrics - 10/18/22 1557       Pre Biometrics   Height 5\' 9"  (1.753 m)    Weight 93 kg    Waist Circumference 42 inches    Hip Circumference 44 inches    Waist to Hip Ratio 0.95 %    BMI (Calculated) 30.26    Triceps Skinfold 15 mm    % Body Fat 29.5 %    Grip Strength 22.3  kg    Flexibility 0 in    Single Leg Stand 0 seconds             Post Biometrics - 12/25/22 1538        Post  Biometrics   Height 5\' 9"  (1.753 m)    Weight 92 kg    Waist Circumference 41 inches    Hip Circumference 44 inches    Waist to Hip Ratio 0.93 %    BMI (Calculated) 29.94    Grip Strength 22.5 kg    Flexibility 0 in    Single Leg Stand 0 seconds

## 2022-12-27 NOTE — Progress Notes (Signed)
Daily Session Note  Patient Details  Name: Arthur Wilson MRN: [id number] Date of Birth: 31-Aug-1945 Referring Provider:   Flowsheet Row CARDIAC REHAB PHASE II ORIENTATION from 10/18/2022 in [facility] CARDIAC REHABILITATION  Referring Provider Dr. Jomarie Longs       Encounter Date: 12/27/2022  Check In:  Session Check In - 12/27/22 1400       Check-In   Supervising physician immediately available to respond to emergencies See telemetry face sheet for immediately available MD    Location [facility]    Staff Present Ross Ludwig, BS, Exercise Physiologist;Beckhem Isadore BSN, RN;Daphyne Daphine Deutscher, RN, BSN    Virtual Visit No    Medication changes reported     No    Fall or balance concerns reported    Yes    Comments History of falls; unsteady gait    Tobacco Cessation No Change    Warm-up and Cool-down Performed on first and last piece of equipment    Resistance Training Performed Yes    VAD Patient? No    PAD/SET Patient? No      Pain Assessment   Currently in Pain? No/denies    Multiple Pain Sites No             Capillary Blood Glucose: No results found for this or any previous visit (from the past 24 hour(s)).    Social History   Tobacco Use  Smoking Status Never  Smokeless Tobacco Never    Goals Met:  Independence with exercise equipment Exercise tolerated well No report of concerns or symptoms today Strength training completed today  Goals Unmet:  Not Applicable  Comments: [name] able to follow exercise prescription today without complaint.  Will continue to monitor for progression.    Dr. Dina Rich is Medical Director for [facility] Cardiac Rehab

## 2022-12-29 ENCOUNTER — Encounter (HOSPITAL_COMMUNITY): Payer: Medicare Other

## 2022-12-29 NOTE — Patient Instructions (Signed)
Discharge Patient Instructions  Patient Details  Name: Arthur Wilson MRN: [id number] Date of Birth: 08/28/1945 Referring Provider:  Carylon Perches, MD   Number of Visits: 57  Reason for Discharge:  Patient reached a stable level of exercise. Patient independent in their exercise. Patient has met program and personal goals.  Smoking History:  Social History   Tobacco Use  Smoking Status Never  Smokeless Tobacco Never    Diagnosis:  NSTEMI (non-ST elevated myocardial infarction) (HCC)  Status post coronary artery stent placement  Initial Exercise Prescription:  Initial Exercise Prescription - 10/18/22 1500       Date of Initial Exercise RX and Referring Provider   Date 10/18/22    Referring Provider Dr. Jomarie Longs      Oxygen   Maintain Oxygen Saturation 88% or higher      Treadmill   MPH 1.5    Grade 0    Minutes 15      NuStep   Level 1    SPM 80    Minutes 15      Prescription Details   Frequency (times per week) 3    Duration Progress to 30 minutes of continuous aerobic without signs/symptoms of physical distress      Intensity   THRR 40-80% of Max Heartrate 106-133    Ratings of Perceived Exertion 11-13    Perceived Dyspnea 0-4      Resistance Training   Training Prescription Yes    Weight 3    Reps 10-15             Discharge Exercise Prescription (Final Exercise Prescription Changes):  Exercise Prescription Changes - 12/20/22 1500       Response to Exercise   Blood Pressure (Admit) 82/42    Blood Pressure (Exercise) 122/60    Blood Pressure (Exit) 112/44    Heart Rate (Admit) 58 bpm    Heart Rate (Exercise) 78 bpm    Heart Rate (Exit) 62 bpm    Rating of Perceived Exertion (Exercise) 13    Duration Continue with 30 min of aerobic exercise without signs/symptoms of physical distress.    Intensity THRR unchanged      Progression   Progression Continue to progress workloads to maintain intensity without signs/symptoms of physical  distress.      Resistance Training   Training Prescription Yes    Weight 5 lbs    Reps 10-15      Treadmill   MPH 2.2    Grade 0    Minutes 15    METs 2.69      NuStep   Level 5    SPM 92    Minutes 15    METs 2.3      Home Exercise Plan   Plans to continue exercise at Home (comment)    Frequency Add 2 additional days to program exercise sessions.    Initial Home Exercises Provided 12/20/22             Functional Capacity:  6 Minute Walk     Row Name 10/18/22 1552 12/25/22 1536       6 Minute Walk   Phase Initial Discharge    Distance 800 feet 990 feet  Initial feet was 760    Distance Feet Change -- 230 ft    Walk Time 6 minutes 6 minutes    # of Rest Breaks 0 0    MPH 1.51 1.87    METS 1.59 1.89    RPE 12  13    Perceived Dyspnea  -- 0    VO2 Peak 5.59 6.62    Symptoms No No    Resting HR 80 bpm 69 bpm    Resting BP 136/68 100/60    Resting Oxygen Saturation  94 % 96 %    Exercise Oxygen Saturation  during 6 min walk 94 % 95 %    Max Ex. HR 93 bpm 83 bpm    Max Ex. BP 130/68 138/60    2 Minute Post BP 132/68 120/60           Nutrition & Weight - Outcomes:  Pre Biometrics - 10/18/22 1557       Pre Biometrics   Height 5\' 9"  (1.753 m)    Weight 93 kg    Waist Circumference 42 inches    Hip Circumference 44 inches    Waist to Hip Ratio 0.95 %    BMI (Calculated) 30.26    Triceps Skinfold 15 mm    % Body Fat 29.5 %    Grip Strength 22.3 kg    Flexibility 0 in    Single Leg Stand 0 seconds             Post Biometrics - 12/25/22 1538        Post  Biometrics   Height 5\' 9"  (1.753 m)    Weight 92 kg    Waist Circumference 41 inches    Hip Circumference 44 inches    Waist to Hip Ratio 0.93 %    BMI (Calculated) 29.94    Grip Strength 22.5 kg    Flexibility 0 in    Single Leg Stand 0 seconds

## 2023-01-01 ENCOUNTER — Encounter (HOSPITAL_COMMUNITY): Payer: Medicare Other

## 2023-01-03 ENCOUNTER — Encounter (HOSPITAL_COMMUNITY): Payer: Medicare Other

## 2023-01-05 ENCOUNTER — Encounter (HOSPITAL_COMMUNITY): Payer: Medicare Other

## 2023-01-08 ENCOUNTER — Encounter (HOSPITAL_COMMUNITY): Payer: Medicare Other

## 2023-01-10 ENCOUNTER — Encounter (HOSPITAL_COMMUNITY): Payer: Medicare Other

## 2023-01-12 ENCOUNTER — Encounter (HOSPITAL_COMMUNITY): Payer: Medicare Other

## 2023-01-15 ENCOUNTER — Encounter (HOSPITAL_COMMUNITY): Payer: Medicare Other

## 2023-01-17 ENCOUNTER — Encounter (HOSPITAL_COMMUNITY): Payer: Medicare Other

## 2023-01-19 ENCOUNTER — Encounter (HOSPITAL_COMMUNITY): Payer: Medicare Other

## 2023-01-22 ENCOUNTER — Encounter (HOSPITAL_COMMUNITY): Payer: Medicare Other

## 2023-01-24 ENCOUNTER — Encounter (HOSPITAL_COMMUNITY): Payer: Medicare Other

## 2023-01-26 ENCOUNTER — Encounter (HOSPITAL_COMMUNITY): Payer: Medicare Other

## 2023-01-31 DIAGNOSIS — D225 Melanocytic nevi of trunk: Secondary | ICD-10-CM | POA: Diagnosis not present

## 2023-01-31 DIAGNOSIS — D2271 Melanocytic nevi of right lower limb, including hip: Secondary | ICD-10-CM | POA: Diagnosis not present

## 2023-01-31 DIAGNOSIS — L578 Other skin changes due to chronic exposure to nonionizing radiation: Secondary | ICD-10-CM | POA: Diagnosis not present

## 2023-01-31 DIAGNOSIS — D2272 Melanocytic nevi of left lower limb, including hip: Secondary | ICD-10-CM | POA: Diagnosis not present

## 2023-01-31 DIAGNOSIS — C44319 Basal cell carcinoma of skin of other parts of face: Secondary | ICD-10-CM | POA: Diagnosis not present

## 2023-02-02 ENCOUNTER — Encounter (HOSPITAL_COMMUNITY): Payer: Medicare Other

## 2023-02-09 ENCOUNTER — Encounter (HOSPITAL_COMMUNITY): Payer: Medicare Other

## 2023-02-14 ENCOUNTER — Ambulatory Visit: Payer: Medicare Other | Attending: Cardiology | Admitting: Cardiology

## 2023-02-14 ENCOUNTER — Encounter: Payer: Self-pay | Admitting: Cardiology

## 2023-02-14 VITALS — BP 116/58 | HR 48 | Ht 69.0 in | Wt 210.2 lb

## 2023-02-14 DIAGNOSIS — I25119 Atherosclerotic heart disease of native coronary artery with unspecified angina pectoris: Secondary | ICD-10-CM | POA: Diagnosis not present

## 2023-02-14 DIAGNOSIS — E782 Mixed hyperlipidemia: Secondary | ICD-10-CM | POA: Diagnosis not present

## 2023-02-14 DIAGNOSIS — I4891 Unspecified atrial fibrillation: Secondary | ICD-10-CM | POA: Diagnosis not present

## 2023-02-14 DIAGNOSIS — G8929 Other chronic pain: Secondary | ICD-10-CM | POA: Diagnosis not present

## 2023-02-14 DIAGNOSIS — Z955 Presence of coronary angioplasty implant and graft: Secondary | ICD-10-CM | POA: Diagnosis not present

## 2023-02-14 DIAGNOSIS — I4821 Permanent atrial fibrillation: Secondary | ICD-10-CM | POA: Diagnosis not present

## 2023-02-14 DIAGNOSIS — R29898 Other symptoms and signs involving the musculoskeletal system: Secondary | ICD-10-CM

## 2023-02-14 DIAGNOSIS — R001 Bradycardia, unspecified: Secondary | ICD-10-CM

## 2023-02-14 DIAGNOSIS — I2510 Atherosclerotic heart disease of native coronary artery without angina pectoris: Secondary | ICD-10-CM | POA: Diagnosis not present

## 2023-02-14 DIAGNOSIS — E785 Hyperlipidemia, unspecified: Secondary | ICD-10-CM | POA: Diagnosis not present

## 2023-02-14 DIAGNOSIS — Z7901 Long term (current) use of anticoagulants: Secondary | ICD-10-CM | POA: Diagnosis not present

## 2023-02-14 DIAGNOSIS — K219 Gastro-esophageal reflux disease without esophagitis: Secondary | ICD-10-CM | POA: Diagnosis not present

## 2023-02-14 MED ORDER — METOPROLOL SUCCINATE ER 100 MG PO TB24: ORAL_TABLET | ORAL | 3 refills | Status: DC

## 2023-02-14 NOTE — Patient Instructions (Signed)
Medication Instructions:  DECREASE Toprol to 100 mg am and 50 mg pm  Labwork: None today  Testing/Procedures: Your physician has requested that you have an ankle brachial index (ABI). During this test an ultrasound and blood pressure cuff are used to evaluate the arteries that supply the arms and legs with blood. Allow thirty minutes for this exam. There are no restrictions or special instructions.  Please note: We ask at that you not bring children with you during ultrasound (echo/ vascular) testing. Due to room size and safety concerns, children are not allowed in the ultrasound rooms during exams. Our front office staff cannot provide observation of children in our lobby area while testing is being conducted. An adult accompanying a patient to their appointment will only be allowed in the ultrasound room at the discretion of the ultrasound technician under special circumstances. We apologize for any inconvenience.   Follow-Up: 6 months  Any Other Special Instructions Will Be Listed Below (If Applicable).  If you need a refill on your cardiac medications before your next appointment, please call your pharmacy.

## 2023-02-14 NOTE — Progress Notes (Signed)
    Cardiology Office Note  Date: 02/14/2023   ID: HATTON KASDORF, DOB [birth year], MRN [id number]  History of Present Illness: Arthur Wilson is a 77 y.o. male last seen in July by Ms. Strader PA-C, I reviewed the note.  He is here for a follow-up visit.  Reports no angina, no sense of palpitations.  He tolerated cardiac rehab well and made improvements in stamina.  Since then had inguinal hernia surgery which limited his activity.  He mainly complains of a feeling of leg weakness and fatigue most recently, has concerns about possible PAD.  He has been try to use a stationary bicycle and weights at home.  I reviewed his medications.  Current cardiac regimen includes Eliquis, Jardiance, Lipitor, Toprol-XL, and Benicar.  He takes Lasix once a week and follows his weight.  I reviewed his lab work from August which is noted below.  LDL 38.  Physical Exam: VS:  BP (!) 116/58   Pulse (!) 48   Ht 5\' 9"  (1.753 m)   Wt 210 lb 3.2 oz (95.3 kg)   SpO2 97%   BMI 31.04 kg/m , BMI Body mass index is 31.04 kg/m.  Wt Readings from Last 3 Encounters:  02/14/23 210 lb 3.2 oz (95.3 kg)  12/25/22 202 lb 13.2 oz (92 kg)  10/18/22 205 lb 0.4 oz (93 kg)    General: Patient appears comfortable at rest. HEENT: Conjunctiva and lids normal. Neck: Supple, no elevated JVP or carotid bruits. Lungs: Clear to auscultation, nonlabored breathing at rest. Cardiac: Irregularly irregular, no S3 or significant systolic murmur. Extremities: 1+ ankle edema.  ECG:  An ECG dated 07/04/2022 was personally reviewed today and demonstrated:  Atrial fibrillation.  Labwork: 03/31/2022: ALT 75; AST 33 04/02/2022: Magnesium 1.8 05/19/2022: B Natriuretic Peptide 783.5 10/11/2022: BUN 24; Creatinine, Ser 1.19; Hemoglobin 12.2; Platelets 187; Potassium 4.6; Sodium 133  August 2024: Hemoglobin 13.6, platelets 170, BUN 25, creatinine 1.15, potassium 4.9, AST 18, ALT 19, cholesterol 93, triglycerides 61, HDL 41, LDL 38, hemoglobin  A1c 6.1%, TSH 1.63  Other Studies Reviewed Today:  No interval cardiac testing for review today.  Assessment and Plan:  1.  HFpEF, LVEF low normal range at 50 to 55% with regional wall motion abnormality suggesting ischemic cardiomyopathy as of echocardiogram in January of this year.  Mildly reduced RV contraction as well.  At this point he continues on Knierim, Benicar, and once weekly Lasix.   2.  CAD status post DES x 2 to the RCA with staged DES to the LAD and medical management of residual obtuse marginal disease as of January of this year.  He is now on Eliquis monotherapy, off antiplatelet regimen altogether.  No active angina.  Continue Lipitor.   3.  Permanent atrial fibrillation with CHA2DS2-VASc score of 4.  Asymptomatic in terms of palpitations.  He is fairly bradycardic, we discussed reducing Toprol-XL to 100 mg in the morning and 50 mg in the evening.  Continue Eliquis for stroke prophylaxis.   4.  CKD stage IIIb, creatinine 1.15 in August of this year.  5.  Leg weakness and fatigue.  Plan to screen for PAD with ABIs.  Disposition:  Follow up  6 months.  Signed, Jonelle Sidle, M.D., F.A.C.C. [facility]

## 2023-02-16 ENCOUNTER — Encounter (HOSPITAL_COMMUNITY): Payer: Medicare Other

## 2023-02-23 ENCOUNTER — Encounter (HOSPITAL_COMMUNITY): Payer: Medicare Other

## 2023-02-26 ENCOUNTER — Ambulatory Visit
Admission: RE | Admit: 2023-02-26 | Discharge: 2023-02-26 | Disposition: A | Discharge status: Home / Self Care | Payer: Medicare Other | Source: Ambulatory Visit | Attending: Nurse Practitioner | Admitting: Nurse Practitioner

## 2023-02-26 VITALS — BP 116/67 | HR 70 | Temp 98.5°F | Resp 16

## 2023-02-26 DIAGNOSIS — J069 Acute upper respiratory infection, unspecified: Secondary | ICD-10-CM | POA: Diagnosis not present

## 2023-02-26 MED ORDER — BENZONATATE 100 MG PO CAPS: 100.0000 mg | ORAL_CAPSULE | Freq: Three times a day (TID) | ORAL | 0 refills | Status: AC | PRN

## 2023-02-26 NOTE — Discharge Instructions (Addendum)
You have a viral upper respiratory infection.  Symptoms should improve over the next week to 10 days.  If you develop chest pain or shortness of breath, go to the emergency room.  Some things that can make you feel better are: - Increased rest - Increasing fluid with water/sugar free electrolytes - Acetaminophen and ibuprofen as needed for fever/pain - Salt water gargling, chloraseptic spray and throat lozenges for sore throat - OTC guaifenesin (Mucinex) 600 mg twice daily for congestion - Saline sinus flushes or a neti pot - Humidifying the air -Tessalon Perles every 8 hours as needed for dry cough

## 2023-02-26 NOTE — ED Triage Notes (Signed)
Pt reports a cough, states his wife had sx's first and then over the weekend he picked up the sx's.

## 2023-02-26 NOTE — ED Provider Notes (Signed)
[facility]    CSN: [id number] Arrival date & time: 02/26/23  1225      History   Chief Complaint Chief Complaint  Patient presents with   Cough    Entered by patient    HPI Arthur Wilson is a 77 y.o. male.   Patient presents today with 3-day history of congested cough with green sputum, stuffy nose, sore throat, decreased appetite.  He denies fever, bodyaches or chills, shortness of breath or chest pain, chest tightness, runny nose, headache, ear pain, abdominal pain, nausea/vomiting, and diarrhea.  No change in energy levels.  Has been taking over-the-counter Flonase, drinking warm liquids with minimal improvement.  Reports wife is sick with similar symptoms and was sick 5 days prior to his symptoms starting.  Patient denies history of chronic lung disease.  Denies needing inhalers in the past.    Past Medical History:  Diagnosis Date   Atrial fibrillation (HCC)    BPH (benign prostatic hyperplasia)    Cervical spondylosis without myelopathy    Coronary artery disease    a. s/p NSTEMI in 03/2022 with DESx2 to RCA and staged orbital atherectomy/DES placement to proximal LAD   Diabetes mellitus without complication (HCC)    Essential hypertension    History of kidney stones    showed on CT scan   Impaired glucose tolerance    Inguinal hernia bilateral, non-recurrent    Myocardial infarction (HCC) 03/30/2022   OSA on CPAP    Testicular hypofunction     Patient Active Problem List   Diagnosis Date Noted   Other thrombophilia (HCC) 05/09/2022   Secondary hyperaldosteronism (HCC) 05/09/2022   Acute diastolic CHF (congestive heart failure) (HCC) 04/04/2022   Elevated brain natriuretic peptide (BNP) level 03/29/2022   Lactic acidosis 03/29/2022   AKI (acute kidney injury) (HCC) 03/29/2022   Type 2 diabetes mellitus with hyperglycemia (HCC) 03/29/2022   Hypotension due to hypovolemia 03/29/2022   Atrial fibrillation with rapid ventricular response (HCC)  03/29/2022   NSTEMI (non-ST elevated myocardial infarction) (HCC) 03/28/2022   Contusion of right knee 03/23/2022   Pain in right knee 03/04/2018   Hallux valgus (acquired), right foot 12/16/2015   OSA on CPAP 06/24/2015   Benign prostatic hyperplasia with urinary obstruction 10/02/2014   Fatigue 02/04/2014   Cervical spondylosis without myelopathy 11/21/2012   Dizziness and giddiness 11/21/2012   Family history of malignant neoplasm of prostate 12/25/2011   Elevated prostate specific antigen (PSA) 06/19/2011   Male hypogonadism 06/19/2011    Past Surgical History:  Procedure Laterality Date   CORONARY ATHERECTOMY N/A 03/31/2022   Procedure: CORONARY ATHERECTOMY;  Surgeon: Orbie Pyo, MD;  Location: [facility];  Service: Cardiovascular;  Laterality: N/A;   CORONARY PRESSURE/FFR STUDY N/A 03/30/2022   Procedure: INTRAVASCULAR PRESSURE WIRE/FFR STUDY;  Surgeon: Orbie Pyo, MD;  Location: [facility];  Service: Cardiovascular;  Laterality: N/A;   CORONARY STENT INTERVENTION N/A 03/30/2022   Procedure: CORONARY STENT INTERVENTION;  Surgeon: Orbie Pyo, MD;  Location: [facility];  Service: Cardiovascular;  Laterality: N/A;   CORONARY STENT INTERVENTION N/A 03/31/2022   Procedure: CORONARY STENT INTERVENTION;  Surgeon: Orbie Pyo, MD;  Location: [facility];  Service: Cardiovascular;  Laterality: N/A;   FOOT SURGERY Right 01/10/2016   Bunion removal   INGUINAL HERNIA REPAIR Bilateral 08/17/2022   Procedure: OPEN BILATERAL INGUINAL HERNIA REPAIRS WITH MESH;  Surgeon: Manus Rudd, MD;  Location: [facility];  Service: General;  Laterality: Bilateral;  GEN AND TAP BLOCK   KNEE SURGERY Left 1992   removal of ACL from ski fall   RIGHT/LEFT HEART CATH AND CORONARY ANGIOGRAPHY N/A 03/30/2022   Procedure: RIGHT/LEFT HEART CATH AND CORONARY ANGIOGRAPHY;  Surgeon: Orbie Pyo, MD;  Location: [facility];  Service: Cardiovascular;   Laterality: N/A;       Home Medications    Prior to Admission medications   Medication Sig Start Date End Date Taking? Authorizing Provider  benzonatate (TESSALON) 100 MG capsule Take 1 capsule (100 mg total) by mouth 3 (three) times daily as needed for cough. Do not take with alcohol or while driving or operating heavy machinery.  May cause drowsiness. 02/26/23  Yes Valentino Nose, NP  acetaminophen (TYLENOL) 500 MG tablet Take 1,000 mg by mouth every 6 (six) hours as needed for moderate pain.    [provider]  ALPRAZolam Prudy Feeler) 0.5 MG tablet Take 0.5 tablets by mouth at bedtime as needed for anxiety or sleep. 07/05/18   [provider]  atorvastatin (LIPITOR) 40 MG tablet Take 1 tablet (40 mg total) by mouth daily. 04/04/22   Zannie Cove, MD  Continuous Blood Gluc Sensor (DEXCOM G7 SENSOR) MISC for 30 Days    [provider]  ELIQUIS 5 MG TABS tablet Take 5 mg by mouth 2 (two) times daily. 12/31/19   [provider]  empagliflozin (JARDIANCE) 10 MG TABS tablet Take 1 tablet (10 mg total) by mouth daily. Patient taking differently: Take 5 mg by mouth daily. 04/05/22   Zannie Cove, MD  fluticasone [facility]) 50 MCG/ACT nasal spray Place 1 spray into both nostrils daily as needed for allergies. 01/12/20   [provider]  HYDROcodone-acetaminophen (NORCO) 10-325 MG tablet Take 1 tablet by mouth 4 (four) times daily. 12/29/22   [provider]  metoprolol succinate (TOPROL-XL) 100 MG 24 hr tablet Take 100 mg am and 50 mg pm. Take with or immediately following a meal. 02/14/23   Jonelle Sidle, MD  olmesartan (BENICAR) 5 MG tablet Take 5 mg by mouth daily. 11/21/22   [provider]  pantoprazole (PROTONIX) 40 MG tablet Take 1 tablet (40 mg total) by mouth daily at 12 noon. 04/04/22   Zannie Cove, MD    Family History Family History  Problem Relation Age of Onset   Breast cancer Mother    Hypertension Father     Heart attack Father    Breast cancer Sister    Hypertension Paternal Grandfather    Prostate cancer Paternal Uncle     Social History Social History   Tobacco Use   Smoking status: Never   Smokeless tobacco: Never  Vaping Use   Vaping status: Never Used  Substance Use Topics   Alcohol use: No   Drug use: No     Allergies   Patient has no known allergies.   Review of Systems Review of Systems Per HPI  Physical Exam Triage Vital Signs ED Triage Vitals [02/26/23 1338]  Encounter Vitals Group     BP 116/67     Systolic BP Percentile      Diastolic BP Percentile      Pulse Rate 70     Resp 16     Temp 98.5 F (36.9 C)     Temp Source Oral     SpO2 95 %     Weight      Height      Head Circumference  Peak Flow      Pain Score 0     Pain Loc      Pain Education      Exclude from Growth Chart    No data found.  Updated Vital Signs BP 116/67   Pulse 70   Temp 98.5 F (36.9 C) (Oral)   Resp 16   SpO2 95%   Visual Acuity Right Eye Distance:   Left Eye Distance:   Bilateral Distance:    Right Eye Near:   Left Eye Near:    Bilateral Near:     Physical Exam Vitals and nursing note reviewed.  Constitutional:      General: He is not in acute distress.    Appearance: Normal appearance. He is not ill-appearing or toxic-appearing.  HENT:     Head: Normocephalic and atraumatic.     Right Ear: Tympanic membrane, ear canal and external ear normal.     Left Ear: Tympanic membrane, ear canal and external ear normal.     Nose: No congestion or rhinorrhea.     Mouth/Throat:     Mouth: Mucous membranes are moist.     Pharynx: Oropharynx is clear. Posterior oropharyngeal erythema present. No oropharyngeal exudate.  Eyes:     General: No scleral icterus.    Extraocular Movements: Extraocular movements intact.  Cardiovascular:     Rate and Rhythm: Normal rate and regular rhythm.  Pulmonary:     Effort: Pulmonary effort is normal. No respiratory distress.      Breath sounds: Normal breath sounds. No wheezing, rhonchi or rales.  Musculoskeletal:     Cervical back: Normal range of motion and neck supple.  Lymphadenopathy:     Cervical: No cervical adenopathy.  Skin:    General: Skin is warm and dry.     Coloration: Skin is not jaundiced or pale.     Findings: No erythema or rash.  Neurological:     Mental Status: He is alert and oriented to person, place, and time.  Psychiatric:        Behavior: Behavior is cooperative.      UC Treatments / Results  Labs (all labs ordered are listed, but only abnormal results are displayed) Labs Reviewed - No data to display  EKG   Radiology No results found.  Procedures Procedures (including critical care time)  Medications Ordered in UC Medications - No data to display  Initial Impression / Assessment and Plan / UC Course  I have reviewed the triage vital signs and the nursing notes.  Pertinent labs & imaging results that were available during my care of the patient were reviewed by me and considered in my medical decision making (see chart for details).   Patient is well-appearing, normotensive, afebrile, not tachycardic, not tachypneic, oxygenating well on room air.    1. Viral URI with cough Vitals and exam are reassuring Suspect viral etiology Viral testing deferred given length of symptoms Supportive care discussed with patient, start cough suppressant medication, Mucinex Return and ER precautions discussed with patient  The patient was given the opportunity to ask questions.  All questions answered to their satisfaction.  The patient is in agreement to this plan.    Final Clinical Impressions(s) / UC Diagnoses   Final diagnoses:  Viral URI with cough     Discharge Instructions      You have a viral upper respiratory infection.  Symptoms should improve over the next week to 10 days.  If you develop chest pain or  shortness of breath, go to the emergency room.  Some  things that can make you feel better are: - Increased rest - Increasing fluid with water/sugar free electrolytes - Acetaminophen and ibuprofen as needed for fever/pain - Salt water gargling, chloraseptic spray and throat lozenges for sore throat - OTC guaifenesin (Mucinex) 600 mg twice daily for congestion - Saline sinus flushes or a neti pot - Humidifying the air -Tessalon Perles every 8 hours as needed for dry cough      ED Prescriptions     Medication Sig Dispense Auth. Provider   benzonatate (TESSALON) 100 MG capsule Take 1 capsule (100 mg total) by mouth 3 (three) times daily as needed for cough. Do not take with alcohol or while driving or operating heavy machinery.  May cause drowsiness. 21 capsule Valentino Nose, NP      PDMP not reviewed this encounter.   Valentino Nose, NP 02/26/23 1422

## 2023-02-28 ENCOUNTER — Ambulatory Visit: Payer: Medicare Other | Attending: Cardiology

## 2023-02-28 DIAGNOSIS — R531 Weakness: Secondary | ICD-10-CM

## 2023-02-28 DIAGNOSIS — R29898 Other symptoms and signs involving the musculoskeletal system: Secondary | ICD-10-CM

## 2023-03-01 LAB — VAS US ABI WITH/WO TBI
Left ABI: 1.05
Right ABI: 1.16

## 2023-03-02 ENCOUNTER — Encounter (HOSPITAL_COMMUNITY): Payer: Medicare Other

## 2023-03-09 ENCOUNTER — Encounter (HOSPITAL_COMMUNITY): Payer: Medicare Other

## 2023-03-16 ENCOUNTER — Encounter (HOSPITAL_COMMUNITY): Payer: Medicare Other

## 2023-03-23 ENCOUNTER — Encounter (HOSPITAL_COMMUNITY): Payer: Medicare Other

## 2023-03-23 DIAGNOSIS — E1129 Type 2 diabetes mellitus with other diabetic kidney complication: Secondary | ICD-10-CM | POA: Diagnosis not present

## 2023-03-29 DIAGNOSIS — M25569 Pain in unspecified knee: Secondary | ICD-10-CM | POA: Diagnosis not present

## 2023-03-29 DIAGNOSIS — E1122 Type 2 diabetes mellitus with diabetic chronic kidney disease: Secondary | ICD-10-CM | POA: Diagnosis not present

## 2023-03-29 DIAGNOSIS — I10 Essential (primary) hypertension: Secondary | ICD-10-CM | POA: Diagnosis not present

## 2023-03-30 ENCOUNTER — Encounter (HOSPITAL_COMMUNITY): Payer: Medicare Other

## 2023-04-02 DIAGNOSIS — K080 Exfoliation of teeth due to systemic causes: Secondary | ICD-10-CM | POA: Diagnosis not present

## 2023-04-03 DIAGNOSIS — C44319 Basal cell carcinoma of skin of other parts of face: Secondary | ICD-10-CM | POA: Diagnosis not present

## 2023-04-06 ENCOUNTER — Encounter (HOSPITAL_COMMUNITY): Payer: Medicare Other

## 2023-04-13 ENCOUNTER — Encounter (HOSPITAL_COMMUNITY): Payer: Medicare Other

## 2023-04-20 ENCOUNTER — Encounter (HOSPITAL_COMMUNITY): Payer: Medicare Other

## 2023-04-27 ENCOUNTER — Encounter (HOSPITAL_COMMUNITY): Payer: Medicare Other

## 2023-05-04 ENCOUNTER — Encounter (HOSPITAL_COMMUNITY): Payer: Medicare Other

## 2023-05-11 ENCOUNTER — Encounter (HOSPITAL_COMMUNITY): Payer: Medicare Other

## 2023-05-18 ENCOUNTER — Encounter (HOSPITAL_COMMUNITY): Payer: Medicare Other

## 2023-05-25 ENCOUNTER — Encounter (HOSPITAL_COMMUNITY): Payer: Medicare Other

## 2023-06-01 ENCOUNTER — Encounter (HOSPITAL_COMMUNITY): Payer: Medicare Other

## 2023-06-08 ENCOUNTER — Encounter (HOSPITAL_COMMUNITY): Payer: Medicare Other

## 2023-06-15 ENCOUNTER — Encounter (HOSPITAL_COMMUNITY): Payer: Medicare Other

## 2023-06-20 DIAGNOSIS — R809 Proteinuria, unspecified: Secondary | ICD-10-CM | POA: Diagnosis not present

## 2023-06-20 DIAGNOSIS — I2510 Atherosclerotic heart disease of native coronary artery without angina pectoris: Secondary | ICD-10-CM | POA: Diagnosis not present

## 2023-06-20 DIAGNOSIS — I10 Essential (primary) hypertension: Secondary | ICD-10-CM | POA: Diagnosis not present

## 2023-06-20 DIAGNOSIS — E1129 Type 2 diabetes mellitus with other diabetic kidney complication: Secondary | ICD-10-CM | POA: Diagnosis not present

## 2023-06-27 DIAGNOSIS — M25569 Pain in unspecified knee: Secondary | ICD-10-CM | POA: Diagnosis not present

## 2023-06-27 DIAGNOSIS — I10 Essential (primary) hypertension: Secondary | ICD-10-CM | POA: Diagnosis not present

## 2023-06-27 DIAGNOSIS — I4821 Permanent atrial fibrillation: Secondary | ICD-10-CM | POA: Diagnosis not present

## 2023-06-27 DIAGNOSIS — E1129 Type 2 diabetes mellitus with other diabetic kidney complication: Secondary | ICD-10-CM | POA: Diagnosis not present

## 2023-08-01 DIAGNOSIS — M25561 Pain in right knee: Secondary | ICD-10-CM | POA: Diagnosis not present

## 2023-08-31 ENCOUNTER — Ambulatory Visit: Payer: Medicare Other | Attending: Cardiology | Admitting: Cardiology

## 2023-08-31 ENCOUNTER — Encounter: Payer: Self-pay | Admitting: Cardiology

## 2023-08-31 VITALS — BP 128/82 | HR 62 | Ht 69.0 in | Wt 211.4 lb

## 2023-08-31 DIAGNOSIS — R29898 Other symptoms and signs involving the musculoskeletal system: Secondary | ICD-10-CM

## 2023-08-31 DIAGNOSIS — I25119 Atherosclerotic heart disease of native coronary artery with unspecified angina pectoris: Secondary | ICD-10-CM | POA: Diagnosis not present

## 2023-08-31 DIAGNOSIS — I4821 Permanent atrial fibrillation: Secondary | ICD-10-CM

## 2023-08-31 DIAGNOSIS — E782 Mixed hyperlipidemia: Secondary | ICD-10-CM

## 2023-08-31 MED ORDER — FUROSEMIDE 20 MG PO TABS: ORAL_TABLET | ORAL | 11 refills | Status: AC

## 2023-08-31 MED ORDER — METOPROLOL SUCCINATE ER 50 MG PO TB24: ORAL_TABLET | ORAL | 11 refills | Status: AC

## 2023-08-31 NOTE — Progress Notes (Signed)
    Cardiology Office Note  Date: 08/31/2023   ID: Arthur Wilson, DOB Jul 10, 1945, MRN [id number]  History of Present Illness: Arthur Wilson is a 78 y.o. male last seen in November 2024.  He is here for a routine visit.  Reports no exertional chest pain and no sense of palpitations.  He has had no sudden dizziness or syncope.  Still complains of a relative weakness in his legs at times, perhaps correlates with fluid retention as he does feel better when he takes his once weekly Lasix  40 mg dose on Wednesdays.  We went over his medications.  We discussed taking an additional Lasix  20 mg tablet on Saturdays.  He is tolerating Toprol -XL 100 mg in the morning and 50 mg in the evening with relative improvement in bradycardia.  I did review his interval lab work which is noted below.  I reviewed his ECG today which shows rate controlled atrial fibrillation at 62 bpm.  Physical Exam: VS:  BP 128/82   Pulse 62   Ht 5\' 9"  (1.753 m)   Wt 211 lb 6.4 oz (95.9 kg)   SpO2 97%   BMI 31.22 kg/m , BMI Body mass index is 31.22 kg/m.  Wt Readings from Last 3 Encounters:  08/31/23 211 lb 6.4 oz (95.9 kg)  02/14/23 210 lb 3.2 oz (95.3 kg)  12/25/22 202 lb 13.2 oz (92 kg)    General: Patient appears comfortable at rest. HEENT: Conjunctiva and lids normal. Lungs: Clear to auscultation, nonlabored breathing at rest. Cardiac: Irregular irregular, no gallop. Extremities: No pitting edema.  ECG:  An ECG dated 07/04/2022 was personally reviewed today and demonstrated:  Atrial fibrillation.  Labwork: 10/11/2022: BUN 24; Creatinine, Ser 1.19; Hemoglobin 12.2; Platelets 187; Potassium 4.6; Sodium 133  August 2024: Cholesterol 93, triglycerides 61, HDL 41, LDL 38 March 2025: BUN 32, creatinine 1.22, potassium 4.7, hemoglobin A1c 6.3%  Other Studies Reviewed Today:  ABIs 02/28/2023: Summary:  Right: Resting right ankle-brachial index is within normal range. The  right toe-brachial index is abnormal.    Left: Resting left ankle-brachial index is within normal range. The left  toe-brachial index is abnormal.   Assessment and Plan:  1.  HFpEF, LVEF low normal range at 50 to 55% with regional wall motion abnormality suggesting ischemic cardiomyopathy as of echocardiogram in January 2024.  Mildly reduced RV contraction as well.  Plan is to change Lasix  to 40 mg on Wednesdays and 20 mg on Saturdays, otherwise continue Jardiance  5 mg daily and Benicar 5 mg daily.   2.  CAD status post DES x 2 to the RCA with staged DES to the LAD and medical management of residual obtuse marginal disease as of January 2024.  He does not report any angina at this time.  Not on aspirin  given use of Eliquis .  Continue statin therapy.   3.  Permanent atrial fibrillation with CHA2DS2-VASc score of 4.  Resting heart rate in the 60s today on Toprol -XL 100 mg in the morning and 50 mg in the evening.  Continue Eliquis  5 mg twice daily for stroke prophylaxis.  I reviewed his interval lab work.  No spontaneous bleeding problems reported.   4.  History of CKD stage IIIb, renal function improved with creatinine 1.22 and GFR 62 as of March.  Disposition:  Follow up 6 months.  Signed, Gerard Knight, M.D., F.A.C.C. [facility]

## 2023-08-31 NOTE — Patient Instructions (Signed)
 Medication Instructions:   Toprol  XL 50 mg Tablets Take 2 ( Tablets 100 mg ) in the am and Take 1 ( Tablet 50 mg ) in the pm.  Take Lasix  40 mg on Wednesday and take 20 mg on Saturday   *If you need a refill on your cardiac medications before your next appointment, please call your pharmacy*  Lab Work: NONE   If you have labs (blood work) drawn today and your tests are completely normal, you will receive your results only by: MyChart Message (if you have MyChart) OR A paper copy in the mail If you have any lab test that is abnormal or we need to change your treatment, we will call you to review the results.  Testing/Procedures: NONE   Follow-Up: At [facility], you and your health needs are our priority.  As part of our continuing mission to provide you with exceptional heart care, our providers are all part of one team.  This team includes your primary Cardiologist (physician) and Advanced Practice Providers or APPs (Physician Assistants and Nurse Practitioners) who all work together to provide you with the care you need, when you need it.  Your next appointment:   6 month(s)  Provider:   You may see Teddie Favre, MD or one of the following Advanced Practice Providers on your designated Care Team:   Woodfin Hays, PA-C  [place], [place] Theotis Flake, [place]     We recommend signing up for the patient portal called "MyChart".  Sign up information is provided on this After Visit Summary.  MyChart is used to connect with patients for Virtual Visits (Telemedicine).  Patients are able to view lab/test results, encounter notes, upcoming appointments, etc.  Non-urgent messages can be sent to your provider as well.   To learn more about what you can do with MyChart, go to [email or web address].   Other Instructions Thank you for choosing [facility]!

## 2023-09-19 DIAGNOSIS — E1129 Type 2 diabetes mellitus with other diabetic kidney complication: Secondary | ICD-10-CM | POA: Diagnosis not present

## 2023-09-19 DIAGNOSIS — Z79899 Other long term (current) drug therapy: Secondary | ICD-10-CM | POA: Diagnosis not present

## 2023-09-19 DIAGNOSIS — I5030 Unspecified diastolic (congestive) heart failure: Secondary | ICD-10-CM | POA: Diagnosis not present

## 2023-09-19 DIAGNOSIS — I10 Essential (primary) hypertension: Secondary | ICD-10-CM | POA: Diagnosis not present

## 2023-09-19 DIAGNOSIS — I4821 Permanent atrial fibrillation: Secondary | ICD-10-CM | POA: Diagnosis not present

## 2023-09-26 DIAGNOSIS — N189 Chronic kidney disease, unspecified: Secondary | ICD-10-CM | POA: Diagnosis not present

## 2023-09-26 DIAGNOSIS — I10 Essential (primary) hypertension: Secondary | ICD-10-CM | POA: Diagnosis not present

## 2023-09-26 DIAGNOSIS — I4821 Permanent atrial fibrillation: Secondary | ICD-10-CM | POA: Diagnosis not present

## 2023-09-26 DIAGNOSIS — E1129 Type 2 diabetes mellitus with other diabetic kidney complication: Secondary | ICD-10-CM | POA: Diagnosis not present

## 2023-10-23 DIAGNOSIS — K080 Exfoliation of teeth due to systemic causes: Secondary | ICD-10-CM | POA: Diagnosis not present

## 2023-10-24 DIAGNOSIS — H5201 Hypermetropia, right eye: Secondary | ICD-10-CM | POA: Diagnosis not present

## 2023-10-24 DIAGNOSIS — H53143 Visual discomfort, bilateral: Secondary | ICD-10-CM | POA: Diagnosis not present

## 2023-10-24 DIAGNOSIS — E119 Type 2 diabetes mellitus without complications: Secondary | ICD-10-CM | POA: Diagnosis not present

## 2023-11-05 DIAGNOSIS — M1711 Unilateral primary osteoarthritis, right knee: Secondary | ICD-10-CM | POA: Diagnosis not present

## 2023-11-13 DIAGNOSIS — I4821 Permanent atrial fibrillation: Secondary | ICD-10-CM | POA: Diagnosis not present

## 2023-11-13 DIAGNOSIS — Z79899 Other long term (current) drug therapy: Secondary | ICD-10-CM | POA: Diagnosis not present

## 2023-11-13 DIAGNOSIS — I10 Essential (primary) hypertension: Secondary | ICD-10-CM | POA: Diagnosis not present

## 2023-11-13 DIAGNOSIS — I5030 Unspecified diastolic (congestive) heart failure: Secondary | ICD-10-CM | POA: Diagnosis not present

## 2023-11-13 DIAGNOSIS — E1129 Type 2 diabetes mellitus with other diabetic kidney complication: Secondary | ICD-10-CM | POA: Diagnosis not present

## 2023-11-20 DIAGNOSIS — N1831 Chronic kidney disease, stage 3a: Secondary | ICD-10-CM | POA: Diagnosis not present

## 2023-11-20 DIAGNOSIS — I10 Essential (primary) hypertension: Secondary | ICD-10-CM | POA: Diagnosis not present

## 2023-11-20 DIAGNOSIS — Z79899 Other long term (current) drug therapy: Secondary | ICD-10-CM | POA: Diagnosis not present

## 2023-11-20 DIAGNOSIS — Z0001 Encounter for general adult medical examination with abnormal findings: Secondary | ICD-10-CM | POA: Diagnosis not present

## 2023-11-20 DIAGNOSIS — I4821 Permanent atrial fibrillation: Secondary | ICD-10-CM | POA: Diagnosis not present

## 2023-11-20 DIAGNOSIS — E1129 Type 2 diabetes mellitus with other diabetic kidney complication: Secondary | ICD-10-CM | POA: Diagnosis not present

## 2023-11-21 ENCOUNTER — Encounter: Payer: Self-pay | Admitting: Internal Medicine

## 2023-12-12 DIAGNOSIS — Z1211 Encounter for screening for malignant neoplasm of colon: Secondary | ICD-10-CM | POA: Diagnosis not present

## 2023-12-12 DIAGNOSIS — Z1212 Encounter for screening for malignant neoplasm of rectum: Secondary | ICD-10-CM | POA: Diagnosis not present

## 2023-12-27 DIAGNOSIS — Z23 Encounter for immunization: Secondary | ICD-10-CM | POA: Diagnosis not present

## 2024-02-08 DIAGNOSIS — M1711 Unilateral primary osteoarthritis, right knee: Secondary | ICD-10-CM | POA: Diagnosis not present

## 2024-02-29 DIAGNOSIS — Z79899 Other long term (current) drug therapy: Secondary | ICD-10-CM | POA: Diagnosis not present

## 2024-02-29 DIAGNOSIS — E1129 Type 2 diabetes mellitus with other diabetic kidney complication: Secondary | ICD-10-CM | POA: Diagnosis not present

## 2024-03-06 DIAGNOSIS — I5030 Unspecified diastolic (congestive) heart failure: Secondary | ICD-10-CM | POA: Diagnosis not present

## 2024-03-06 DIAGNOSIS — E1122 Type 2 diabetes mellitus with diabetic chronic kidney disease: Secondary | ICD-10-CM | POA: Diagnosis not present

## 2024-03-06 DIAGNOSIS — N1831 Chronic kidney disease, stage 3a: Secondary | ICD-10-CM | POA: Diagnosis not present

## 2024-04-07 ENCOUNTER — Other Ambulatory Visit: Payer: Self-pay | Admitting: Surgery

## 2024-04-07 DIAGNOSIS — K4090 Unilateral inguinal hernia, without obstruction or gangrene, not specified as recurrent: Secondary | ICD-10-CM

## 2024-04-15 NOTE — Progress Notes (Unsigned)
 "  Cardiology Office Note    Date:  04/17/2024  ID:  MUADH CREASY, DOB [birth year], MRN [id number] Cardiologist: Jayson Sierras, MD { :  History of Present Illness:    Arthur Wilson is a 79 y.o. male with past medical history of CAD (s/p NSTEMI in 03/2022 with DESx2 to RCA and staged orbital atherectomy/DES placement to proximal LAD), permanent atrial fibrillation (rate-control pursued given asymptomatic state, OSA and atrial dilation), HTN, HLD and OSA who presents to the office today for 73-month follow-up.  He was last examined by Dr. Sierras in 08/2023 and denied any recent anginal symptoms at that time. Reported taking Lasix  approximately 40 mg once weekly. He did have some lower extremity edema and was encouraged to take Lasix  20 mg on Saturdays as well while being continued on Eliquis  5 mg twice daily, Atorvastatin  40 mg daily, Jardiance  5 mg daily, Toprol -XL 100 mg in AM/50 mg in PM and Olmesartan 5 mg daily.  In talking with the patient today, he has been under increased stress as his wife has lung cancer. He is the primary caregiver for her but does have an aide that helps several days a week. 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 He has not experienced any symptoms in regards to his atrial fibrillation. No recent exertional chest pain.  Reports his breathing has been stable and no recent orthopnea or PND. He does experience intermittent lower extremity edema and wears compression stockings on a daily basis and takes Lasix  2 days/week.  Studies Reviewed:   EKG: EKG is not ordered today.  Cardiac Catheterization: 03/2022   Prox LAD lesion is 75% stenosed.   1st Mrg lesion is 90% stenosed.   Ost RCA to Prox RCA lesion is 60% stenosed.   Prox RCA lesion is 70% stenosed.   A stent was successfully placed.   Post intervention, there is a 0% residual stenosis.   Post intervention, there is a 0% residual stenosis.   1.  High-grade  disease of proximal LAD and obtuse marginal with RFR positive ostial and proximal to mid right coronary artery disease. 2.  After review with Dr. Jeffrie, PCI of the right coronary artery was pursued with 2 overlapping drug-eluting stents placed. 3.  Due to dye expenditure, PCI of the proximal LAD and left circumflex will be staged depending on renal function. 4.  LVEDP of 9 mmHg. 5.  Fick cardiac output of 5.5 L/min and Fick cardiac index of 2.4 L/min/m with a mean RA pressure of 11 mmHg, mean wedge pressure of 18 mmHg, and mean PA pressure of 30 mmHg. 6.  Very difficult cannulation of right coronary artery; please see procedural details.   Recommendation: Given history of atrial fibrillation the patient will be maintained on triple therapy for 1 month and then Plavix  and Eliquis  for 6 months in total and then Eliquis  indefinitely.  Depending on the patient's creatinine staged PCI of the LAD and left circumflex will be pursued.   Coronary Stent Intervention: 03/2022   Prox LAD lesion is 75% stenosed.   1st Mrg lesion is 90% stenosed.   Non-stenotic Ost RCA to Prox RCA lesion was previously treated.   Non-stenotic Prox RCA lesion was previously treated.   A stent was successfully placed.   Post intervention, there is a 0% residual stenosis.   1.  Successful PCI of proximal LAD aided by orbital atherectomy with OCT optimization. 2.  Residual high-grade and highly calcified  lesion of the obtuse marginal; given the relatively small territory subtended by this vessel and that the patient's symptoms are dyspnea rather than angina, after discussion with Dr. Jeffrie medical therapy will be pursued for this burden of disease. 3.  LVEDP of 10 mmHg but obscured by widely fluctuating RR intervals due to atrial fibrillation.   Recommendation: Triple therapy for ideally 4 weeks then Plavix  and Eliquis  for 6 months in total then Eliquis  monotherapy thereafter.   Physical Exam:   VS:  BP 110/72 (BP  Location: Right Arm, Cuff Size: Normal)   Pulse 60   Ht 5' 9 (1.753 m)   Wt 218 lb 12.8 oz (99.2 kg)   SpO2 95%   BMI 32.31 kg/m    Wt Readings from Last 3 Encounters:  04/16/24 218 lb 12.8 oz (99.2 kg)  08/31/23 211 lb 6.4 oz (95.9 kg)  02/14/23 210 lb 3.2 oz (95.3 kg)     GEN: Well nourished, well developed in no acute distress NECK: No JVD; No carotid bruits CARDIAC: Irregularly irregular, no murmurs, rubs, gallops RESPIRATORY:  Clear to auscultation without rales, wheezing or rhonchi  ABDOMEN: Appears non-distended. No obvious abdominal masses. EXTREMITIES: No clubbing or cyanosis. No pitting edema.  Distal pedal pulses are 2+ bilaterally.   Assessment and Plan:   1. Coronary artery disease involving native coronary artery of native heart without angina pectoris - He previously had an NSTEMI in 03/2022 with DES x 2 to the RCA and staged PCI with DES to the proximal LAD. He denies any recent anginal symptoms. - He is no longer on ASA given the need for anticoagulation. Continue Jardiance  5 mg daily, Toprol -XL 100 mg in AM/50 mg in PM and Atorvastatin  40 mg daily.  2. Permanent atrial fibrillation (HCC) - He denies any recent palpitations and his heart rate is well-controlled in the 60's during today's visit. Continue Toprol -XL 100 mg in AM/50 mg in PM for rate-control. - Continue Eliquis  5 mg twice daily for anticoagulation which is the correct dose given his current age (79 yo), weight (218 lbs) and renal function (creatinine at 1.42 by most recent check). Labs in 10/2023 showed his hemoglobin was stable at 15.7 and platelets at 172 K.   3. Chronic HFpEF - Echocardiogram in 03/2022 showed a low-normal EF of 50 to 55% but this was prior to undergoing cardiac stenting. His respiratory status has been stable and lower extremity edema has overall been well-controlled. Continue current medical therapy for now with Jardiance  5 mg daily, Lasix  twice weekly, Toprol -XL 100 mg in AM/50 mg  in PM and Olmesartan 5 mg daily.  4. Hyperlipidemia LDL goal <70 - FLP in 10/2023 showed total cholesterol at 96 and LDL at 42. LFT's WNL. Continue current medical therapy with Atorvastatin  40 mg daily.  5. Essential hypertension - His blood pressure is well-controlled at 110/72 during today's visit. Continue current medical therapy with Toprol -XL 100 mg in AM/50 mg in PM and Olmesartan 5 mg daily.  6. Stage 3 CKD - Creatinine was at 1.42 when checked in 10/2023 which is close to his known baseline. He remains on Jardiance  5 mg daily and Olmesartan 5 mg daily.   Signed, Laymon CHRISTELLA Qua, PA-C   "

## 2024-04-16 ENCOUNTER — Encounter: Payer: Self-pay | Admitting: Student

## 2024-04-16 ENCOUNTER — Ambulatory Visit: Attending: Student | Admitting: Student

## 2024-04-16 VITALS — BP 110/72 | HR 60 | Ht 69.0 in | Wt 218.8 lb

## 2024-04-16 DIAGNOSIS — N1832 Chronic kidney disease, stage 3b: Secondary | ICD-10-CM

## 2024-04-16 DIAGNOSIS — I4821 Permanent atrial fibrillation: Secondary | ICD-10-CM | POA: Diagnosis not present

## 2024-04-16 DIAGNOSIS — E785 Hyperlipidemia, unspecified: Secondary | ICD-10-CM

## 2024-04-16 DIAGNOSIS — I10 Essential (primary) hypertension: Secondary | ICD-10-CM

## 2024-04-16 DIAGNOSIS — I5032 Chronic diastolic (congestive) heart failure: Secondary | ICD-10-CM

## 2024-04-16 DIAGNOSIS — I2510 Atherosclerotic heart disease of native coronary artery without angina pectoris: Secondary | ICD-10-CM | POA: Diagnosis not present

## 2024-04-16 NOTE — Patient Instructions (Signed)
 Medication Instructions:  Your physician recommends that you continue on your current medications as directed. Please refer to the Current Medication list given to you today.  *If you need a refill on your cardiac medications before your next appointment, please call your pharmacy*  Lab Work: None If you have labs (blood work) drawn today and your tests are completely normal, you will receive your results only by: MyChart Message (if you have MyChart) OR A paper copy in the mail If you have any lab test that is abnormal or we need to change your treatment, we will call you to review the results.  Testing/Procedures: None  Follow-Up: At [facility], you and your health needs are our priority.  As part of our continuing mission to provide you with exceptional heart care, our providers are all part of one team.  This team includes your primary Cardiologist (physician) and Advanced Practice Providers or APPs (Physician Assistants and Nurse Practitioners) who all work together to provide you with the care you need, when you need it.  Your next appointment:   6 month(s)  Provider:   You may see Jayson Sierras, MD or one of the following Advanced Practice Providers on your designated Care Team:   Laymon Qua, PA-C  [place], [place] Olivia [place], [place]     We recommend signing up for the patient portal called MyChart.  Sign up information is provided on this After Visit Summary.  MyChart is used to connect with patients for Virtual Visits (Telemedicine).  Patients are able to view lab/test results, encounter notes, upcoming appointments, etc.  Non-urgent messages can be sent to your provider as well.   To learn more about what you can do with MyChart, go to [email or web address].   Other Instructions Thank you for choosing [facility]!

## 2024-04-17 ENCOUNTER — Encounter: Payer: Self-pay | Admitting: Student

## 2024-04-29 ENCOUNTER — Ambulatory Visit (HOSPITAL_COMMUNITY)
Admission: RE | Admit: 2024-04-29 | Discharge: 2024-04-29 | Disposition: A | Source: Ambulatory Visit | Attending: Surgery | Admitting: Surgery

## 2024-04-29 DIAGNOSIS — K4090 Unilateral inguinal hernia, without obstruction or gangrene, not specified as recurrent: Secondary | ICD-10-CM

## 2024-04-29 MED ORDER — IOHEXOL 300 MG/ML  SOLN
100.0000 mL | Freq: Once | INTRAMUSCULAR | Status: AC | PRN
  Administered 2024-04-29: 100 mL via INTRAVENOUS
